# Patient Record
Sex: Female | Born: 1990
Health system: Southern US, Community
[De-identification: ages and names within clinical notes are randomized; demographics above are authoritative.]

## PROBLEM LIST (undated history)

## (undated) DIAGNOSIS — I10 Essential (primary) hypertension: Secondary | ICD-10-CM

## (undated) DIAGNOSIS — E669 Obesity, unspecified: Secondary | ICD-10-CM

## (undated) DIAGNOSIS — A6000 Herpesviral infection of urogenital system, unspecified: Secondary | ICD-10-CM

## (undated) DIAGNOSIS — O210 Mild hyperemesis gravidarum: Secondary | ICD-10-CM

## (undated) DIAGNOSIS — K851 Biliary acute pancreatitis without necrosis or infection: Secondary | ICD-10-CM

## (undated) DIAGNOSIS — L089 Local infection of the skin and subcutaneous tissue, unspecified: Secondary | ICD-10-CM

## (undated) DIAGNOSIS — B999 Unspecified infectious disease: Secondary | ICD-10-CM

## (undated) HISTORY — DX: Biliary acute pancreatitis without necrosis or infection: K85.10

## (undated) HISTORY — PX: BREAST REDUCTION SURGERY: SHX8

## (undated) HISTORY — DX: Mild hyperemesis gravidarum: O21.0

---
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
## 2009-02-19 ENCOUNTER — Emergency Department (HOSPITAL_COMMUNITY): Admission: EM | Admit: 2009-02-19 | Discharge: 2009-02-19 | Payer: Self-pay | Admitting: Family Medicine

## 2009-04-26 ENCOUNTER — Ambulatory Visit: Payer: Self-pay | Admitting: Physician Assistant

## 2009-04-26 DIAGNOSIS — A609 Anogenital herpesviral infection, unspecified: Secondary | ICD-10-CM | POA: Insufficient documentation

## 2009-04-26 DIAGNOSIS — A6000 Herpesviral infection of urogenital system, unspecified: Secondary | ICD-10-CM

## 2009-05-08 ENCOUNTER — Encounter: Payer: Self-pay | Admitting: Physician Assistant

## 2009-06-06 ENCOUNTER — Encounter: Payer: Self-pay | Admitting: Physician Assistant

## 2009-08-16 ENCOUNTER — Ambulatory Visit: Payer: Self-pay | Admitting: Physician Assistant

## 2009-08-20 ENCOUNTER — Ambulatory Visit: Payer: Self-pay | Admitting: Physician Assistant

## 2009-08-21 ENCOUNTER — Encounter: Payer: Self-pay | Admitting: Physician Assistant

## 2009-08-21 LAB — CONVERTED CEMR LAB
Cholesterol, target level: 200 mg/dL
HDL: 49 mg/dL (ref >39)
LDL Goal: 160 mg/dL
Triglycerides: 138 mg/dL (ref <150)
VLDL: 28 mg/dL (ref 0–40)

## 2009-11-22 ENCOUNTER — Telehealth (INDEPENDENT_AMBULATORY_CARE_PROVIDER_SITE_OTHER): Payer: Self-pay | Admitting: Nurse Practitioner

## 2009-11-26 ENCOUNTER — Encounter: Payer: Self-pay | Admitting: Physician Assistant

## 2009-11-26 ENCOUNTER — Ambulatory Visit: Payer: Self-pay | Admitting: Nurse Practitioner

## 2009-11-26 DIAGNOSIS — N3000 Acute cystitis without hematuria: Secondary | ICD-10-CM | POA: Insufficient documentation

## 2009-11-26 DIAGNOSIS — I10 Essential (primary) hypertension: Secondary | ICD-10-CM | POA: Insufficient documentation

## 2009-11-26 LAB — CONVERTED CEMR LAB
Blood Glucose, Fingerstick: 111
Protein, U semiquant: 30
Urobilinogen, UA: 0.2
pH: 7

## 2009-11-27 LAB — CONVERTED CEMR LAB
Alkaline Phosphatase: 71 units/L (ref 39–117)
BUN: 11 mg/dL (ref 6–23)
Calcium: 9.7 mg/dL (ref 8.4–10.5)
Creatinine, Ser: 0.78 mg/dL (ref 0.40–1.20)
Eosinophils Absolute: 0.4 10*3/uL (ref 0.0–0.7)
Glucose, Bld: 80 mg/dL (ref 70–99)
HCT: 41.6 % (ref 36.0–46.0)
Hemoglobin: 13.1 g/dL (ref 12.0–15.0)
Lymphs Abs: 4.9 10*3/uL — ABNORMAL HIGH (ref 0.7–4.0)
MCHC: 31.5 g/dL (ref 30.0–36.0)
MCV: 86.8 fL (ref 78.0–100.0)
Monocytes Absolute: 0.7 10*3/uL (ref 0.1–1.0)
Platelets: 344 10*3/uL (ref 150–400)
Potassium: 4.8 meq/L (ref 3.5–5.3)
RDW: 13 % (ref 11.5–15.5)
TSH: 0.797 microintl units/mL (ref 0.350–4.500)
Total Bilirubin: 0.3 mg/dL (ref 0.3–1.2)
Total Protein: 7.4 g/dL (ref 6.0–8.3)

## 2010-02-12 NOTE — Assessment & Plan Note (Signed)
Summary: NEW PT/ FIRST EST CARE//GK   Vital Signs:  Patient profile:   20 year old female Height:      68 inches Weight:      308 pounds BMI:     47.00 Temp:     97.9 degrees F oral Pulse rate:   111 / minute Pulse rhythm:   regular Resp:     18 per minute BP sitting:   129 / 82  (left arm) Cuff size:   large  Vitals Entered By: Mikey College CMA (April 26, 2009 2:23 PM) CC: NP..... Is Patient Diabetic? No Pain Assessment Patient in pain? no       Does patient need assistance? Functional Status Self care Ambulation Normal   Primary Care Provider:  Tereso Newcomer PA-C  CC:  NP......  History of Present Illness: New Patient.  No complaints.  [facility] I see her sister and her mother.   Health Maint: Well Woman checks done at [facility]. Last pap 04/2009:  always have been normal. Sexually active with one partner. Last Td: probably more than 10 years.   Habits & Providers  Alcohol-Tobacco-Diet     Alcohol drinks/day: 0     Tobacco Status: never  Exercise-Depression-Behavior     Drug Use: no  Allergies (verified): No Known Drug Allergies  Past History:  Past Medical History: Genital Herpes  Past Surgical History: s/p breast reduction surgery 2009  Family History: DM - Greatgrandmother No breast, colon or ovarian cancer.  Social History: Occupation: Film/video editor; studying Spanish Single no kids Never Smoked Alcohol use-no Drug use-no Occupation:  employed Smoking Status:  never Drug Use:  no  Review of Systems  The patient denies fever, chest pain, dyspnea on exertion, prolonged cough, melena, and hematochezia.    Physical Exam  General:  alert, well-developed, and well-nourished.   Head:  normocephalic and atraumatic.   Eyes:  pupils equal, pupils round, and pupils reactive to light.   Ears:  R ear normal and L ear normal.   Nose:  no external deformity.   Mouth:  pharynx pink and moist.   Neck:  supple and no  thyromegaly.   Lungs:  normal breath sounds, no crackles, and no wheezes.   Heart:  normal rate and regular rhythm.   Abdomen:  soft and non-tender.   Neurologic:  alert & oriented X3 and cranial nerves II-XII intact.   Psych:  normally interactive.     Impression & Recommendations:  Problem # 1:  Preventive Health Care (ICD-V70.0) get records from [facility]. schedule CPP in one year return fasting for labs (CMET, CBC, TSH, Lipids)  Problem # 2:  GENITAL HERPES (ICD-054.10) refill Valtrex as needed  Problem # 3:  MORBID OBESITY (ICD-278.01) long d/w Davida Helt about diet and exercise rec. the [employer] Diet  also will refer to [employer]  Complete Medication List: 1)  Valtrex 500 Mg Tabs (Valacyclovir hcl) .Marland Kitchen.. 1 by mouth every 12 hours for 3 days as needed for outbreak  Patient Instructions: 1)  Return in the next 2-4 weeks fasting for labs (do not eat or drink anything after midnight except water): 2)  CMET, CBC, TSH, Lipids, Drug Screen (V70.0, 278.01) 3)  Schedule appointment with Drucilla Schmidt for nutrition counseling. 4)  I suggest you look at the book, "The [facility] Diet," by Dr. Roylene Reason for help with diet. 5)  Exercise:  Increase walking until you get to 20-30 minutes 5-7 days a  week.  Walk fast enough that you would not be able to talk on the cell phone. 6)  Try counting calories for 2 weeks.  Then, review your caloric intake to see where you can cut back on certain things. 7)  Sign forms to get records released from [facility]. 8)  Please schedule a follow-up appointment in 1 year with Tashai Catino for CPP.

## 2010-02-12 NOTE — Assessment & Plan Note (Signed)
Summary: Acute - Cystitis   Vital Signs:  Patient profile:   20 year old female LMP:     10/27/2009 Weight:      302.2 pounds BMI:     46.12 Temp:     97.1 degrees F oral Pulse rate:   88 / minute Pulse rhythm:   regular Resp:     20 per minute BP sitting:   130 / 90  (left arm) Cuff size:   large  Vitals Entered By: Levon Hedger (November 26, 2009 2:58 PM)  Nutrition Counseling: Patient's BMI is greater than 25 and therefore counseled on weight management options. CC: elevated blood pressure....wants to be tested for diabetes, Hypertension Management Is Patient Diabetic? No Pain Assessment Patient in pain? no      CBG Result 111 CBG Device ID A  Does patient need assistance? Functional Status Self care Ambulation Normal LMP (date): 10/27/2009     Enter LMP: 10/27/2009   Primary Care Provider:  Tereso Newcomer PA-C  CC:  elevated blood pressure....wants to be tested for diabetes and Hypertension Management.  History of Present Illness:  Pt into the office with c/o elevated blood pressure. Pt reports that she [facility] to the [facility] for [facility]TI and was informed that her BP was elevated. No previous reports of htn.  Obesity - Down 5 pounds since the last visit.   pt does not do any meaningful exercise  Cystitis - Still with pelvic pressure She took all the medications as prescribed by the health department  Hypertension History:      She denies headache, chest pain, and palpitations.  She notes no problems with any antihypertensive medication side effects.        Positive major cardiovascular risk factors include hypertension.  Negative major cardiovascular risk factors include female age less than 21 years old, no history of diabetes, negative family history for ischemic heart disease, and non-tobacco-user status.        Further assessment for target organ damage reveals no history of ASHD, stroke/TIA, or peripheral vascular disease.     Habits &  Providers  Alcohol-Tobacco-Diet     Alcohol drinks/day: 0     Tobacco Status: never  Exercise-Depression-Behavior     Does Patient Exercise: no     Drug Use: no  Allergies (verified): No Known Drug Allergies  Family History: DM - Greatgrandmother No breast, colon or ovarian cancer. maternal grandmother - htn  Social History: Does Patient Exercise:  no  Review of Systems General:  Denies fever. CV:  Denies chest pain or discomfort. Resp:  Denies cough. GI:  Denies abdominal pain, nausea, and vomiting. Endo:  Denies excessive thirst and excessive urination.  Physical Exam  General:  alert.   Head:  normocephalic.   Lungs:  normal breath sounds.   Heart:  normal rate and regular rhythm.   Abdomen:  normal bowel sounds.   Msk:  normal ROM.   Neurologic:  alert & oriented X3.   Skin:  color normal.   Psych:  Oriented X3.     Impression & Recommendations:  Problem # 1:  HYPERTENSION, BENIGN ESSENTIAL (ICD-401.1)  BP elevated today. advised weight loss and diet control. Orders: T-Comprehensive Metabolic Panel [id number]) T-CBC w/Diff ([id number]) Rapid HIV  (29562)  Problem # 2:  NEED PROPHYLACTIC VACCINATION&INOCULATION FLU (ICD-V04.81) given flu today in office  Problem # 3:  MORBID OBESITY (ICD-278.01)  pt advised to start exercise and diet  Orders: T-TSH ([id number])  Problem # 4:  ACUTE CYSTITIS (  ICD-595.0)  Orders: UA Dipstick w/o Micro (manual) (14782) T-Culture, Urine ([id number])  Complete Medication List: 1)  Valtrex 500 Mg Tabs (Valacyclovir hcl) .Marland Kitchen.. 1 by mouth every 12 hours for 3 days as needed for outbreak  Other Orders: Capillary Blood Glucose/CBG (78469) Flu Vaccine 55yrs + (62952) Admin 1st Vaccine (84132)  Hypertension Assessment/Plan:      The patient's hypertensive risk group is category A: No risk factors and no target organ damage.  Her calculated 10 year risk of coronary heart disease is 1 %.  Today's blood pressure is  130/90.  Her blood pressure goal is < 140/90.  Patient Instructions: 1)  Blood pressure - elevated today. 2)  You will need to monitor your diet and exercise. 3)  Start walking at least 10-15 minutes daily. 4)  You have received the flu vaccine today. 5)  Schedule an appointment for a complete physical exam 6)  Come fasting after midnight before this appointment.  7)  Will need PAP, lipids, u/a   Orders Added: 1)  Capillary Blood Glucose/CBG [82948] 2)  Est. Patient Level III [44010] 3)  UA Dipstick w/o Micro (manual) [81002] 4)  T-Comprehensive Metabolic Panel [80053-22900] 5)  T-CBC w/Diff [27253-66440] 6)  Rapid HIV  [92370] 7)  T-TSH [34742-59563] 8)  T-Culture, Urine [87564-33295] 9)  Flu Vaccine 34yrs + [18841] 10)  Admin 1st Vaccine [66063]   Immunizations Administered:  Influenza Vaccine # 1:    Vaccine Type: Fluvax 3+    Site: right deltoid    Mfr: GlaxoSmithKline    Dose: 0.5 ml    Route: IM    Given by: Levon Hedger    Exp. Date: 07/13/2010    Lot #: [id number]    VIS given: 08/07/09 version given November 26, 2009.  Flu Vaccine Consent Questions:    Do you have a history of severe allergic reactions to this vaccine? no    Any prior history of allergic reactions to egg and/or gelatin? no    Do you have a sensitivity to the preservative Thimersol? no    Do you have a past history of Guillan-Barre Syndrome? no    Do you currently have an acute febrile illness? no    Have you ever had a severe reaction to latex? no    Vaccine information given and explained to patient? yes    Are you currently pregnant? no    ndc  [phone]  Immunizations Administered:  Influenza Vaccine # 1:    Vaccine Type: Fluvax 3+    Site: right deltoid    Mfr: GlaxoSmithKline    Dose: 0.5 ml    Route: IM    Given by: Levon Hedger    Exp. Date: 07/13/2010    Lot #: [id number]    VIS given: 08/07/09 version given November 26, 2009.  Laboratory Results   Urine  Tests  Date/Time Received: November 26, 2009 3:16 PM   Routine Urinalysis   Color: dk yellow Appearance: Cloudy Glucose: negative   (Normal Range: Negative) Bilirubin: negative   (Normal Range: Negative) Ketone: negative   (Normal Range: Negative) Spec. Gravity: 1.015   (Normal Range: 1.003-1.035) Blood: small   (Normal Range: Negative) pH: 7.0   (Normal Range: 5.0-8.0) Protein: 30   (Normal Range: Negative) Urobilinogen: 0.2   (Normal Range: 0-1) Nitrite: positive   (Normal Range: Negative) Leukocyte Esterace: large   (Normal Range: Negative)     Blood Tests     CBG Random:: 111    Other Tests  Rapid HIV: negative    Prevention & Chronic Care Immunizations   Influenza vaccine: Fluvax 3+  (11/26/2009)    Tetanus booster: 08/16/2001: historical    Pneumococcal vaccine: Not documented  Other Screening   Pap smear: Not documented   Smoking status: never  (11/26/2009)  Hypertension   Last Blood Pressure: 130 / 90  (11/26/2009)   Serum creatinine: Not documented   Serum potassium Not documented CMP ordered   Self-Management Support :    Hypertension self-management support: Not documented    Laboratory Results   Urine Tests    Routine Urinalysis   Color: dk yellow Appearance: Cloudy Glucose: negative   (Normal Range: Negative) Bilirubin: negative   (Normal Range: Negative) Ketone: negative   (Normal Range: Negative) Spec. Gravity: 1.015   (Normal Range: 1.003-1.035) Blood: small   (Normal Range: Negative) pH: 7.0   (Normal Range: 5.0-8.0) Protein: 30   (Normal Range: Negative) Urobilinogen: 0.2   (Normal Range: 0-1) Nitrite: positive   (Normal Range: Negative) Leukocyte Esterace: large   (Normal Range: Negative)     Blood Tests     CBG Random:: 111mg /dL    Other Tests  Rapid HIV: negative

## 2010-02-12 NOTE — Letter (Signed)
Summary: PT INFORMATION SHEET  PT INFORMATION SHEET   Imported By: Arta Bruce 06/26/2009 14:54:52  _____________________________________________________________________  External Attachment:    Type:   Image     Comment:   External Document

## 2010-02-12 NOTE — Letter (Signed)
Summary: Lipid Letter  [facility]  [address] [place], [place] [address]   Phone: [phone]  Fax: [phone]    08/21/2009  Stacy Fowler [address] Shaune [place], [place]  [address]  Dear Stacy Fowler:  Here are the results of your cholesterol test:    Cholesterol:       183     Goal: <200   HDL "good" Cholesterol:   49     Goal: >40   LDL "bad" Cholesterol:   106     Goal: <160   Triglycerides:       138     Goal: <150   As you can see, your cholesterol goals have been met.  You do not need to check your cholesterol again for 5 years.  If you have any questions, please call.   Sincerely,    Tereso Newcomer PA-C

## 2010-02-12 NOTE — Miscellaneous (Signed)
  Clinical Lists Changes  Observations: Added new observation of TD BOOSTER: historical (08/16/2001 10:28)

## 2010-02-12 NOTE — Assessment & Plan Note (Signed)
Summary: Patient not seen; requesting cholesterol check only   Allergies: No Known Drug Allergies   Complete Medication List: 1)  Valtrex 500 Mg Tabs (Valacyclovir hcl) .Marland Kitchen.. 1 by mouth every 12 hours for 3 days as needed for outbreak

## 2010-02-12 NOTE — Progress Notes (Signed)
Summary: POSSIBLE BP  Phone Note Call from Patient Call back at [phone]   Reason for Call: Talk to Nurse Summary of Call: WEAVER PT MS Irby WENT TO [facility] FOR A UTI, AND ALSO TOLD HER SHE HAS HIGH BLOOD PRESSURE. SO SHE IS TRYING TO GET AN APPT. AND ALSO HER MOTHER WANTS HER TO BE CHECKED FOR DIABETES, BECAUSE IT RUNS ON HER GRANDMOTHERS SIDE OF THE FAMILY. Initial call taken by: Leodis Rains,  November 22, 2009 3:34 PM  Follow-up for Phone Call        Gave her Bactrim for the UTI, didn't do anything for BP -- "151/something."  Denies other symptoms with BP.  Had discomfort with voiding, no other symptoms specified.  Denies polydipsia, polyphagia or polyuria.  Also wants to be checked for diabetes at appt.  Appt. made 11/26/09. Follow-up by: Dutch Quint RN,  November 23, 2009 5:04 PM

## 2010-02-12 NOTE — Letter (Signed)
Summary: REQUESTING RECORDS FROM [facility]  REQUESTING RECORDS FROM [facility]   Imported By: Arta Bruce 06/04/2009 11:51:33  _____________________________________________________________________  External Attachment:    Type:   Image     Comment:   External Document

## 2010-02-12 NOTE — Letter (Signed)
Summary: REQUESTING RECORDS FROM [facility]  REQUESTING RECORDS FROM [facility]   Imported By: Arta Bruce 06/04/2009 11:46:31  _____________________________________________________________________  External Attachment:    Type:   Image     Comment:   External Document

## 2010-04-03 LAB — URINE CULTURE

## 2010-04-03 LAB — POCT URINALYSIS DIP (DEVICE)
Nitrite: NEGATIVE
Urobilinogen, UA: 0.2 mg/dL (ref 0.0–1.0)
pH: 8.5 — ABNORMAL HIGH (ref 5.0–8.0)

## 2010-04-03 LAB — POCT PREGNANCY, URINE: Preg Test, Ur: NEGATIVE

## 2010-05-28 NOTE — Op Note (Signed)
Stacy Fowler, FRANZE                 ACCOUNT NO.:  [id number]   MEDICAL RECORD NO.:  [id number]          PATIENT TYPE:  AMB   LOCATION:  DSC                          FACILITY:  [facility]   PHYSICIAN:  Earvin Hansen L. Truesdale, M.D.DATE OF BIRTH:  Apr 07, 1990   DATE OF PROCEDURE:  07/12/2007  DATE OF DISCHARGE:                               OPERATIVE REPORT   This is an 20 year old with severe severe severe macromastia and  gynecomastia, increased sensory breast tissue causing back pain,  shoulder pain, intertriginous changes throughout the portions of her  breasts causing intolerable and the patient is now being prepared for  bilateral breast reduction for medical reasons with excision of  accessory breast tissue.   ANESTHESIA:  General.   Preoperatively, the patient was setup and drawn for the new inferior  pedicle reduction mammoplasty, the remarking nipple-areolar complex  approximately 26 cm from the suprasternal notch.  She underwent general  anesthesia, intubated orally.  Prep was done to the chest, breast, areas  in a routine fashion using Betadine soap and solution and walled off  with sterile towels and drapes so as to make a sterile field.  One-  quarter percent Xylocaine with epinephrine injected locally total of 150  mL per side for vasoconstriction.  The wounds were scored with #15  blades.  The skin of the inferior pedicle was de-epithelialized with #20  blade.  Medial and lateral fatty dermal pedicles were incised down to  underlying fascia.  After proper hemostasis, the new keyhole area was  also debulked and laterally accessory breast tissue debulked with large  amounts.  Hemostasis was maintained with the Bovie unit on coagulation  and after this, the flaps were transposed and stayed with 3-0 Prolene  suture.  Subcutaneous closure was done with 3-0 Monocryl x2 layers and  running subcuticular stitch of 3-0 Monocryl and 5-0 Monocryl throughout  the inverted T.  The wounds were  drained with #10 fully fluted Blake  drain which was placed in the depths of wound and brought out through  the lateral-most portion of the incision and secured with 3-0 Prolene.  The wounds were cleansed, Steri-Strips and soft dressing were applied  including Xeroform, 4x4s, ABDs, Hypafix tape.  At the end of the  procedure, the nipple-areolar complex were examined showing excellent  suppleness and blood supply.  She was then taken to recovery in  excellent condition.   ESTIMATED BLOOD LOSS:  Less than 150 mL.   COMPLICATIONS:  None.      Yaakov Guthrie. Shon Hough, M.D.  Electronically Signed     GLT/MEDQ  D:  07/12/2007  T:  07/13/2007  Job:  [id number]

## 2010-10-21 ENCOUNTER — Inpatient Hospital Stay (INDEPENDENT_AMBULATORY_CARE_PROVIDER_SITE_OTHER)
Admission: RE | Admit: 2010-10-21 | Discharge: 2010-10-21 | Disposition: A | Discharge status: Home / Self Care | Payer: Self-pay | Source: Ambulatory Visit | Attending: Family Medicine | Admitting: Family Medicine

## 2010-10-21 DIAGNOSIS — A6000 Herpesviral infection of urogenital system, unspecified: Secondary | ICD-10-CM

## 2010-10-21 DIAGNOSIS — L089 Local infection of the skin and subcutaneous tissue, unspecified: Secondary | ICD-10-CM

## 2010-10-26 ENCOUNTER — Inpatient Hospital Stay (INDEPENDENT_AMBULATORY_CARE_PROVIDER_SITE_OTHER)
Admission: RE | Admit: 2010-10-26 | Discharge: 2010-10-26 | Disposition: A | Discharge status: Home / Self Care | Payer: Self-pay | Source: Ambulatory Visit | Attending: Family Medicine | Admitting: Family Medicine

## 2010-10-26 DIAGNOSIS — L0231 Cutaneous abscess of buttock: Secondary | ICD-10-CM

## 2011-08-22 ENCOUNTER — Emergency Department (HOSPITAL_COMMUNITY)
Admission: EM | Admit: 2011-08-22 | Discharge: 2011-08-23 | Disposition: A | Discharge status: Home / Self Care | Payer: Self-pay | Attending: Emergency Medicine | Admitting: Emergency Medicine

## 2011-08-22 ENCOUNTER — Encounter (HOSPITAL_COMMUNITY): Payer: Self-pay | Admitting: Emergency Medicine

## 2011-08-22 DIAGNOSIS — L03317 Cellulitis of buttock: Secondary | ICD-10-CM | POA: Insufficient documentation

## 2011-08-22 DIAGNOSIS — L0231 Cutaneous abscess of buttock: Secondary | ICD-10-CM | POA: Insufficient documentation

## 2011-08-22 NOTE — ED Notes (Signed)
Pt states she has a boil at the end of her tailbone area that she noticed yesterday  Pt states she has had one before in the same spot and had to have an I&D at [facility]

## 2011-08-23 MED ORDER — SULFAMETHOXAZOLE-TRIMETHOPRIM 800-160 MG PO TABS: 2.0000 | ORAL_TABLET | Freq: Two times a day (BID) | ORAL | Status: AC

## 2011-08-23 MED ORDER — HYDROCODONE-ACETAMINOPHEN 5-325 MG PO TABS: 1.0000 | ORAL_TABLET | ORAL | Status: AC | PRN

## 2011-08-23 NOTE — ED Provider Notes (Signed)
History     CSN: [id number]  Arrival date & time 08/22/11  2011   First MD Initiated Contact with Patient 08/22/11 2349      Chief Complaint  Patient presents with  . Abscess    (Consider location/radiation/quality/duration/timing/severity/associated sxs/prior treatment) HPI Comments: Patient with hx abscess reports pain and swelling over her bilateral buttocks that she noticed today.  Denies fever, chills, discharge from the wound.  Has had I&D of the same location before.    Patient is a 21 y.o. female presenting with abscess. The history is provided by the patient.  Abscess  Pertinent negatives include no fever.    History reviewed. No pertinent past medical history.  Past Surgical History  Procedure Date  . Breast reduction surgery     Family History  Problem Relation Age of Onset  . Diabetes Other   . Hypertension Other     History  Substance Use Topics  . Smoking status: Never Smoker   . Smokeless tobacco: Not on file  . Alcohol Use: No    OB History    Grav Para Term Preterm Abortions TAB SAB Ect Mult Living                  Review of Systems  Constitutional: Negative for fever and chills.  Skin: Negative for rash and wound.    Allergies  Other  Home Medications   Current Outpatient Rx  Name Route Sig Dispense Refill  . ETONOGESTREL 68 MG Antler IMPL Subcutaneous Inject 1 each into the skin continuous.    Marland Kitchen VALACYCLOVIR HCL 500 MG PO TABS Oral Take 500 mg by mouth as needed.      BP 121/65  Pulse 94  Temp 99.5 F (37.5 C)  Resp 18  SpO2 98%  Physical Exam  Nursing note and vitals reviewed. Constitutional: She appears well-developed and well-nourished. No distress.  HENT:  Head: Normocephalic and atraumatic.  Neck: Neck supple.  Pulmonary/Chest: Effort normal.  Neurological: She is alert.  Skin: She is not diaphoretic.       ED Course  Procedures (including critical care time)  Labs Reviewed - No data to display No results  found.  INCISION AND DRAINAGE Performed by: Trixie Dredge B  Performed by Alroy Dust, PA-S, under my supervision.   Consent: Verbal consent obtained. Risks and benefits: risks, benefits and alternatives were discussed Type: abscess  Body area: right buttock  Anesthesia: local infiltration  Local anesthetic: lidocaine 2% no epinephrine  Anesthetic total: 5 ml  Complexity: complex Blunt dissection to break up loculations  Drainage: bloody Drainage amount: small  Packing material: none  Patient tolerance: Patient tolerated the procedure well with no immediate complications.     1. Abscess of buttock       MDM  Pt with two small areas of induration on her bilateral buttocks, tender to palpation.  No overlying cellulitis.  I&D performed without purulent drainage.  Likely early abscess.  Pt has had this in the same spot before.  Pt d/c home on bactrim, norco.  Pt aware she may need further I&D as abscess develops.  Discussed diagnosis, follow up with patient.  Pt given return precautions.  Pt verbalizes understanding and agrees with plan.           [place], [place] 08/23/11 0100

## 2011-08-24 NOTE — ED Provider Notes (Signed)
Medical screening examination/treatment/procedure(s) were performed by non-physician practitioner and as supervising physician I was immediately available for consultation/collaboration.  Flay Ghosh, MD 08/24/11 0641 

## 2011-08-27 ENCOUNTER — Emergency Department (INDEPENDENT_AMBULATORY_CARE_PROVIDER_SITE_OTHER)
Admission: EM | Admit: 2011-08-27 | Discharge: 2011-08-27 | Disposition: A | Discharge status: Home / Self Care | Payer: Self-pay | Source: Home / Self Care | Attending: Emergency Medicine | Admitting: Emergency Medicine

## 2011-08-27 ENCOUNTER — Encounter (HOSPITAL_COMMUNITY): Payer: Self-pay | Admitting: *Deleted

## 2011-08-27 DIAGNOSIS — L0501 Pilonidal cyst with abscess: Secondary | ICD-10-CM

## 2011-08-27 HISTORY — DX: Obesity, unspecified: E66.9

## 2011-08-27 HISTORY — DX: Herpesviral infection of urogenital system, unspecified: A60.00

## 2011-08-27 HISTORY — DX: Local infection of the skin and subcutaneous tissue, unspecified: L08.9

## 2011-08-27 MED ORDER — IBUPROFEN 800 MG PO TABS: 800.0000 mg | ORAL_TABLET | Freq: Three times a day (TID) | ORAL | Status: AC | PRN

## 2011-08-27 MED ORDER — METRONIDAZOLE 500 MG PO TABS: 500.0000 mg | ORAL_TABLET | Freq: Three times a day (TID) | ORAL | Status: AC

## 2011-08-27 MED ORDER — BACITRACIN 500 UNIT/GM EX OINT
1.0000 "application " | TOPICAL_OINTMENT | Freq: Once | CUTANEOUS | Status: AC
  Administered 2011-08-27: 1 via TOPICAL

## 2011-08-27 MED ORDER — LIDOCAINE-EPINEPHRINE 2 %-1:100000 IJ SOLN
5.0000 mL | Freq: Once | INTRAMUSCULAR | Status: AC
  Administered 2011-08-27: 5 mL

## 2011-08-27 NOTE — ED Provider Notes (Signed)
History     CSN: [id number]  Arrival date & time 08/27/11  1342   First MD Initiated Contact with Patient 08/27/11 1504      Chief Complaint  Patient presents with  . Recurrent Skin Infections    (Consider location/radiation/quality/duration/timing/severity/associated sxs/prior treatment) HPI Comments: Pt with painful erythematous mass of gradually increasing size on R buttock x 1 week. Was seen in the [facility] 5 days ago, I&D was attempted with no purulent drainage. She was started on Bactrim, Vicodin. Patient states that pain is getting worse. No drainage. No N/V, fevers. States she gets frequent abscesses, but has only had one in this area once before. She is not a diabetic.  ROS as noted in HPI. All other ROS negative.    Past Medical History  Diagnosis Date  . Skin infection   . Genital herpes   . Obesity     Past Surgical History  Procedure Date  . Breast reduction surgery     Family History  Problem Relation Age of Onset  . Diabetes Other   . Hypertension Other     History  Substance Use Topics  . Smoking status: Never Smoker   . Smokeless tobacco: Not on file  . Alcohol Use: No    OB History    Grav Para Term Preterm Abortions TAB SAB Ect Mult Living                  Review of Systems  Allergies  Other  Home Medications   Current Outpatient Rx  Name Route Sig Dispense Refill  . ETONOGESTREL 68 MG [place] IMPL Subcutaneous Inject 1 each into the skin continuous.    Marland Kitchen HYDROCODONE-ACETAMINOPHEN 5-325 MG PO TABS Oral Take 1-2 tablets by mouth every 4 (four) hours as needed for pain. 15 tablet 0  . IBUPROFEN 800 MG PO TABS Oral Take 1 tablet (800 mg total) by mouth every 8 (eight) hours as needed for pain or fever. 20 tablet 0  . METRONIDAZOLE 500 MG PO TABS Oral Take 1 tablet (500 mg total) by mouth 3 (three) times daily. X 7 days 21 tablet 0  . SULFAMETHOXAZOLE-TRIMETHOPRIM 800-160 MG PO TABS Oral Take 2 tablets by mouth 2 (two) times daily. 28  tablet 0  . VALACYCLOVIR HCL 500 MG PO TABS Oral Take 500 mg by mouth as needed.      BP 134/75  Pulse 104  Temp 98.6 F (37 C) (Oral)  Resp 16  SpO2 100%  Physical Exam  Nursing note and vitals reviewed. Constitutional: She is oriented to person, place, and time. She appears well-developed and well-nourished. No distress.  HENT:  Head: Normocephalic and atraumatic.  Eyes: Conjunctivae and EOM are normal.  Neck: Normal range of motion.  Cardiovascular: Normal rate.   Pulmonary/Chest: Effort normal.  Abdominal: She exhibits no distension.  Genitourinary:       7.5 x 4.5 cm area of tender erythema right medial buttocks just lateral to the gluteal cleft. Large amount of central fluctuance. Healing I&D wound.  Musculoskeletal: Normal range of motion.  Neurological: She is alert and oriented to person, place, and time. Coordination normal.  Skin: Skin is warm and dry.  Psychiatric: She has a normal mood and affect. Her behavior is normal. Judgment and thought content normal.    ED Course  INCISION AND DRAINAGE Date/Time: 08/27/2011 4:59 PM Performed by: Luiz Blare Authorized by: Luiz Blare Consent: Verbal consent obtained. Risks and benefits: risks, benefits and alternatives  were discussed Consent given by: patient Patient understanding: patient states understanding of the procedure being performed Patient consent: the patient's understanding of the procedure matches consent given Procedure consent: procedure consent matches procedure scheduled Required items: required blood products, implants, devices, and special equipment available Patient identity confirmed: verbally with patient Type: pilonidal cyst Body area: anogenital Location details: gluteal cleft Local anesthetic: lidocaine 2% with epinephrine Anesthetic total: 5 ml Patient sedated: no Scalpel size: 11 Incision type: single straight Complexity: simple Drainage: purulent Drainage amount:  copious Wound treatment: drain placed Packing material: 1/4 in iodoform gauze Patient tolerance: Patient tolerated the procedure well with no immediate complications. Comments: Drain extensive amount of purulent foul-smelling material mixed with blood. Irrigated wound with 5 mL of lidocaine with epi 2%, place packing, pressure dressing.   (including critical care time)  Labs Reviewed - No data to display No results found.   1. Pilonidal cyst with abscess     MDM  Starting on Flagyl, will have her continue the Bactrim. Starting ibuprofen and will have her continue the Norco. Having her return in 2 days for recheck and packing removal. Discussed signs and symptoms that should prompt an earlier return. Patient agrees with plan.    Luiz Blare, MD 08/27/11 1700

## 2011-08-27 NOTE — ED Notes (Signed)
Pt  Seen  [facility]  [facility]  5  Days  Ago  For  Boil  -   Placed  On  Anti biotics  And  Told  To  Return  If  Worse       Pt  denys  Any  Diabetes  Or  Any  Other  Chronic  Medical problems         Pt is  Alert  And  Oriented   Appears  In no  Acute  Distress

## 2012-09-25 ENCOUNTER — Emergency Department (HOSPITAL_COMMUNITY)
Admission: EM | Admit: 2012-09-25 | Discharge: 2012-09-25 | Disposition: A | Discharge status: Home / Self Care | Payer: Commercial Managed Care - PPO | Attending: Emergency Medicine | Admitting: Emergency Medicine

## 2012-09-25 ENCOUNTER — Encounter (HOSPITAL_COMMUNITY): Payer: Self-pay | Admitting: *Deleted

## 2012-09-25 DIAGNOSIS — J020 Streptococcal pharyngitis: Secondary | ICD-10-CM | POA: Insufficient documentation

## 2012-09-25 DIAGNOSIS — R51 Headache: Secondary | ICD-10-CM | POA: Insufficient documentation

## 2012-09-25 DIAGNOSIS — Z8619 Personal history of other infectious and parasitic diseases: Secondary | ICD-10-CM | POA: Insufficient documentation

## 2012-09-25 DIAGNOSIS — R059 Cough, unspecified: Secondary | ICD-10-CM | POA: Insufficient documentation

## 2012-09-25 DIAGNOSIS — Z872 Personal history of diseases of the skin and subcutaneous tissue: Secondary | ICD-10-CM | POA: Insufficient documentation

## 2012-09-25 DIAGNOSIS — E669 Obesity, unspecified: Secondary | ICD-10-CM | POA: Insufficient documentation

## 2012-09-25 DIAGNOSIS — R509 Fever, unspecified: Secondary | ICD-10-CM | POA: Insufficient documentation

## 2012-09-25 DIAGNOSIS — R05 Cough: Secondary | ICD-10-CM | POA: Insufficient documentation

## 2012-09-25 MED ORDER — ACETAMINOPHEN 160 MG/5ML PO SOLN: 15.0000 mg/kg | Freq: Once | ORAL | Status: DC

## 2012-09-25 MED ORDER — DEXAMETHASONE SODIUM PHOSPHATE 10 MG/ML IJ SOLN
10.0000 mg | Freq: Once | INTRAMUSCULAR | Status: AC
  Administered 2012-09-25: 10 mg via INTRAVENOUS
  Filled 2012-09-25: qty 1

## 2012-09-25 MED ORDER — SODIUM CHLORIDE 0.9 % IV BOLUS (SEPSIS)
1000.0000 mL | Freq: Once | INTRAVENOUS | Status: AC
  Administered 2012-09-25: 1000 mL via INTRAVENOUS

## 2012-09-25 MED ORDER — PIPERACILLIN-TAZOBACTAM 3.375 G IVPB 30 MIN: 3.3750 g | Freq: Once | INTRAVENOUS | Status: DC

## 2012-09-25 MED ORDER — CLINDAMYCIN PHOSPHATE 600 MG/50ML IV SOLN
600.0000 mg | Freq: Once | INTRAVENOUS | Status: AC
  Administered 2012-09-25: 600 mg via INTRAVENOUS
  Filled 2012-09-25: qty 50

## 2012-09-25 MED ORDER — ACETAMINOPHEN 500 MG PO TABS
1000.0000 mg | ORAL_TABLET | Freq: Once | ORAL | Status: AC
  Administered 2012-09-25: 1000 mg via ORAL
  Filled 2012-09-25: qty 2

## 2012-09-25 MED ORDER — PENICILLIN V POTASSIUM 500 MG PO TABS: 500.0000 mg | ORAL_TABLET | Freq: Four times a day (QID) | ORAL | Status: DC

## 2012-09-25 NOTE — ED Provider Notes (Signed)
CSN: [id number]     Arrival date & time 09/25/12  2014 History   First MD Initiated Contact with Patient 09/25/12 2035     Chief Complaint  Patient presents with  . Sore Throat  . Headache  . Fever   (Consider location/radiation/quality/duration/timing/severity/associated sxs/prior Treatment) HPI Comments: Patient is a 22 year old female who presents today with sore throat since yesterday. She took NyQuil and used cough drops with no relief. Her throat hurts worse when she swallows. She has been able to drink fluids, but has not eaten anything today. She has a mild associated cough. No ear pain. No congestion. Reports that she feels very hot in her body hurts. She has not measured her temperature.   The history is provided by the patient. No language interpreter was used.    Past Medical History  Diagnosis Date  . Skin infection   . Genital herpes   . Obesity    Past Surgical History  Procedure Laterality Date  . Breast reduction surgery     Family History  Problem Relation Age of Onset  . Diabetes Other   . Hypertension Other    History  Substance Use Topics  . Smoking status: Never Smoker   . Smokeless tobacco: Not on file  . Alcohol Use: No   OB History   Grav Para Term Preterm Abortions TAB SAB Ect Mult Living                 Review of Systems  Constitutional: Positive for fever.  HENT: Positive for sore throat. Negative for ear pain.   Gastrointestinal: Negative for nausea, vomiting and abdominal pain.  All other systems reviewed and are negative.    Allergies  Other  Home Medications   Current Outpatient Rx  Name  Route  Sig  Dispense  Refill  . etonogestrel (IMPLANON) 68 MG IMPL implant   Subcutaneous   Inject 1 each into the skin continuous.          BP 126/75  Pulse 114  Temp(Src) 100.7 F (38.2 C) (Oral)  Resp 20  Ht 5\' 7"  (1.702 m)  SpO2 98% Physical Exam  Nursing note and vitals reviewed. Constitutional: She is oriented to person,  place, and time. She appears well-developed and well-nourished. No distress.  HENT:  Head: Normocephalic and atraumatic.  Right Ear: Tympanic membrane, external ear and ear canal normal.  Left Ear: Tympanic membrane, external ear and ear canal normal.  Nose: Nose normal.  Mouth/Throat: Uvula is midline and mucous membranes are normal. No trismus in the jaw. No edematous. Posterior oropharyngeal erythema present. No tonsillar abscesses.  Significant bilateral tonsillar swelling.   Eyes: Conjunctivae are normal.  Neck: Normal range of motion.  Cardiovascular: Normal rate, regular rhythm and normal heart sounds.   Pulmonary/Chest: Effort normal and breath sounds normal. No stridor. No respiratory distress. She has no wheezes. She has no rales.  Abdominal: Soft. She exhibits no distension.  Musculoskeletal: Normal range of motion.  Neurological: She is alert and oriented to person, place, and time. She has normal strength.  Skin: Skin is warm and dry. She is not diaphoretic. No erythema.  Psychiatric: She has a normal mood and affect. Her behavior is normal.    ED Course  Procedures (including critical care time) Labs Review Labs Reviewed  RAPID STREP SCREEN - Abnormal; Notable for the following:    Streptococcus, Group A Screen (Direct) POSITIVE (*)    All other components within normal limits   Imaging Review  No results found.  MDM   1. Strep pharyngitis    Pt febrile with tonsillar exudate, cervical lymphadenopathy, & dysphagia; diagnosis of strep. Treated in the Ed with steroids, IV clinda, tylenol, and a fluid bolus.  Pt appears mildly dehydrated, discussed importance of water rehydration. Presentation non concerning for PTA or infxn spread to soft tissue. No trismus or uvula deviation. No trismus. Pt able to tolerate fluids and swallow pills in ED. Strict specific return precautions discussed. Pt able to drink water in ED without difficulty with intact air way. Recommended PCP  follow up.   Medications  dexamethasone (DECADRON) injection 10 mg (10 mg Intravenous Given 09/25/12 2136)  sodium chloride 0.9 % bolus 1,000 mL (0 mLs Intravenous Stopped 09/25/12 2255)  acetaminophen (TYLENOL) tablet 1,000 mg (1,000 mg Oral Given 09/25/12 2124)  clindamycin (CLEOCIN) IVPB 600 mg (0 mg Intravenous Stopped 09/25/12 2234)        Mora Bellman, PA-C 09/26/12 [phone]

## 2012-09-25 NOTE — ED Notes (Signed)
Pt states she noticed last night she had a sore throat and she took nyquill and halls cough drops,  No better today,  Feels hot and bodyaches

## 2012-09-27 NOTE — ED Provider Notes (Signed)
Medical screening examination/treatment/procedure(s) were conducted as a shared visit with non-physician practitioner(s) and myself.  I personally evaluated the patient during the encounter Pt c/o sore throat and fever. Strep pos. No asymmetric swelling or abscess noted. Breathing comfortably, no stridor. Able to swallow. abx rx.   Suzi Roots, MD 09/27/12 [phone]

## 2013-01-10 ENCOUNTER — Encounter (HOSPITAL_COMMUNITY): Payer: Self-pay | Admitting: Emergency Medicine

## 2013-01-10 ENCOUNTER — Emergency Department (HOSPITAL_COMMUNITY)
Admission: EM | Admit: 2013-01-10 | Discharge: 2013-01-10 | Disposition: A | Discharge status: Home / Self Care | Payer: Commercial Managed Care - PPO | Attending: Emergency Medicine | Admitting: Emergency Medicine

## 2013-01-10 DIAGNOSIS — L02419 Cutaneous abscess of limb, unspecified: Secondary | ICD-10-CM

## 2013-01-10 DIAGNOSIS — E669 Obesity, unspecified: Secondary | ICD-10-CM | POA: Insufficient documentation

## 2013-01-10 DIAGNOSIS — Z8619 Personal history of other infectious and parasitic diseases: Secondary | ICD-10-CM | POA: Insufficient documentation

## 2013-01-10 DIAGNOSIS — Z79899 Other long term (current) drug therapy: Secondary | ICD-10-CM | POA: Insufficient documentation

## 2013-01-10 DIAGNOSIS — IMO0002 Reserved for concepts with insufficient information to code with codable children: Secondary | ICD-10-CM | POA: Insufficient documentation

## 2013-01-10 MED ORDER — SULFAMETHOXAZOLE-TRIMETHOPRIM 800-160 MG PO TABS: 1.0000 | ORAL_TABLET | Freq: Two times a day (BID) | ORAL | Status: AC

## 2013-01-10 NOTE — ED Notes (Signed)
Dressing applied to patient's arm

## 2013-01-10 NOTE — ED Provider Notes (Signed)
CSN: [id number]     Arrival date & time 01/10/13  1912 History  This chart was scribed for non-physician practitioner, Antony Madura, PA-C working with Nelia Shi, MD by Greggory Stallion, ED scribe. This patient was seen in room [facility] and the patient's care was started at 9:59 PM.   Chief Complaint  Patient presents with  . Abscess   The history is provided by the patient. No language interpreter was used.   HPI Comments: Stacy Fowler is a 22 y.o. female who presents to the Emergency Department complaining of a worsening abscess under her left arm that she noticed 3 days ago. Pt states there is also associated pain which is constant and nonradiating. She has done warm compresses twice per day with no relief. Denies fever, red linear streaking, breast tenderness, nipple discharge, arm numbness, and weakness. Pt has history of abscess. Denies history of MRSA.   Past Medical History  Diagnosis Date  . Skin infection   . Genital herpes   . Obesity    Past Surgical History  Procedure Laterality Date  . Breast reduction surgery     Family History  Problem Relation Age of Onset  . Diabetes Other   . Hypertension Other    History  Substance Use Topics  . Smoking status: Never Smoker   . Smokeless tobacco: Not on file  . Alcohol Use: No   OB History   Grav Para Term Preterm Abortions TAB SAB Ect Mult Living                 Review of Systems  Constitutional: Negative for fever.  Skin:       Abscess.  Neurological: Negative for numbness.  All other systems reviewed and are negative.   Allergies  Other  Home Medications   Current Outpatient Rx  Name  Route  Sig  Dispense  Refill  . etonogestrel (IMPLANON) 68 MG IMPL implant   Subcutaneous   Inject 1 each into the skin continuous. Jan 2013         . sulfamethoxazole-trimethoprim (BACTRIM DS,SEPTRA DS) 800-160 MG per tablet   Oral   Take 1 tablet by mouth 2 (two) times daily.   14 tablet   0    BP 148/88   Pulse 105  Temp(Src) 99.2 F (37.3 C) (Oral)  Resp 20  Ht 5\' 7"  (1.702 m)  Wt 341 lb 6 oz (154.847 kg)  BMI 53.45 kg/m2  SpO2 98%  Physical Exam  Nursing note and vitals reviewed. Constitutional: She is oriented to person, place, and time. She appears well-developed and well-nourished. No distress.  HENT:  Head: Normocephalic and atraumatic.  Eyes: Conjunctivae and EOM are normal. No scleral icterus.  Neck: Normal range of motion.  Cardiovascular: Normal rate, regular rhythm and intact distal pulses.   Pulses:      Radial pulses are 2+ on the left side.  Pulmonary/Chest: Effort normal. No respiratory distress.  Musculoskeletal: Normal range of motion.  Neurological: She is alert and oriented to person, place, and time.  No gross sensory deficits appreciated. Pt moves extremities without ataxia.   Skin: Skin is warm and dry. No rash noted. She is not diaphoretic. No erythema. No pallor.  Area of induration of approximately 5 cm in diameter with central area of fluctuance of 1.5 cm along anterior axillary line. Erythema. No red linear streaking. No active drainage, weeping, or bleeding.   Psychiatric: She has a normal mood and affect. Her behavior is normal.  ED Course  Procedures (including critical care time)  DIAGNOSTIC STUDIES: Oxygen Saturation is 98% on RA, normal by my interpretation.    COORDINATION OF CARE: 10:02 PM-Discussed treatment plan which includes I&D with pt at bedside and pt agreed to plan.   INCISION AND DRAINAGE Performed by: Antony Madura, PA-C Consent: Verbal consent obtained. Risks and benefits: risks, benefits and alternatives were discussed Type: abscess  Body area: left axilla along anterior aspect  Anesthesia: local infiltration  Incision was made with a scalpel.  Local anesthetic: lidocaine 2% with epinephrine  Anesthetic total: 3.5 ml  Complexity: complex Blunt dissection to break up loculations  Drainage: purulent,  bloody  Drainage amount: moderate, approximately 3cc  Packing material: none  Patient tolerance: Patient tolerated the procedure well with no immediate complications.   Labs Review Labs Reviewed - No data to display Imaging Review No results found.  EKG Interpretation   None       MDM   1. Axillary abscess    Uncomplicated abscess of left axilla. Patient well and nontoxic appearing, hemodynamically stable, and afebrile. She is neurovascularly intact with normal sensation bilateral upper extremities. No red linear streaking appreciated. I&D performed at bedside which patient tolerated well with no immediate complications. She is stable for discharge with instruction to return in 48 hours for a recheck. Ibuprofen advised for pain control and Bactrim prescribed for symptoms. Patient agreeable to plan with no unaddressed concerns.  I personally performed the services described in this documentation, which was scribed in my presence. The recorded information has been reviewed and is accurate.    Antony Madura, PA-C 01/10/13 2303

## 2013-01-10 NOTE — ED Notes (Signed)
Abscess under left arm x 3days; getting larger; history of same

## 2013-01-10 NOTE — ED Notes (Signed)
Pt presents with an abscess under her left armpit; denies drainage.

## 2013-01-13 NOTE — ED Provider Notes (Signed)
Medical screening examination/treatment/procedure(s) were performed by non-physician practitioner and as supervising physician I was immediately available for consultation/collaboration.   Dot Lanes, MD 01/13/13 1056

## 2014-08-03 ENCOUNTER — Encounter (HOSPITAL_COMMUNITY): Payer: Self-pay

## 2014-08-03 ENCOUNTER — Emergency Department (HOSPITAL_COMMUNITY)
Admission: EM | Admit: 2014-08-03 | Discharge: 2014-08-03 | Disposition: A | Discharge status: Home / Self Care | Payer: Commercial Managed Care - PPO | Attending: Emergency Medicine | Admitting: Emergency Medicine

## 2014-08-03 DIAGNOSIS — Z872 Personal history of diseases of the skin and subcutaneous tissue: Secondary | ICD-10-CM | POA: Insufficient documentation

## 2014-08-03 DIAGNOSIS — E669 Obesity, unspecified: Secondary | ICD-10-CM | POA: Insufficient documentation

## 2014-08-03 DIAGNOSIS — J309 Allergic rhinitis, unspecified: Secondary | ICD-10-CM

## 2014-08-03 DIAGNOSIS — Z8619 Personal history of other infectious and parasitic diseases: Secondary | ICD-10-CM | POA: Insufficient documentation

## 2014-08-03 NOTE — ED Provider Notes (Signed)
CSN: [id number]     Arrival date & time 08/03/14  1107 History   First MD Initiated Contact with Patient 08/03/14 1118     Chief Complaint  Patient presents with  . Sore Throat     (Consider location/radiation/quality/duration/timing/severity/associated sxs/prior Treatment) HPI   Stacy Fowler is a 24 y.o. femaleWho presents for evaluation of sore throat generalized itching, rhinorrhea and nonproductive cough. She denies fever, chills, ear pain, weakness or dizziness. There are no other known modifying factors.   Past Medical History  Diagnosis Date  . Skin infection   . Genital herpes   . Obesity    Past Surgical History  Procedure Laterality Date  . Breast reduction surgery     Family History  Problem Relation Age of Onset  . Diabetes Other   . Hypertension Other    History  Substance Use Topics  . Smoking status: Never Smoker   . Smokeless tobacco: Not on file  . Alcohol Use: No   OB History    No data available     Review of Systems  All other systems reviewed and are negative.     Allergies  Other  Home Medications   Prior to Admission medications   Medication Sig Start Date End Date Taking? Authorizing Provider  etonogestrel (IMPLANON) 68 MG IMPL implant Inject 1 each into the skin continuous. Jan 2013    Historical Provider, MD   BP 162/100 mmHg  Pulse 100  Temp(Src) 98.2 F (36.8 C) (Oral)  Resp 18  SpO2 98%  LMP 07/26/2014 (Approximate) Physical Exam  Constitutional: She is oriented to person, place, and time. She appears well-developed and well-nourished. No distress.  HENT:  Head: Normocephalic and atraumatic.  Right Ear: External ear normal.  Left Ear: External ear normal.  No tonsillar hypertrophy or exudate.  Eyes: Conjunctivae and EOM are normal. Pupils are equal, round, and reactive to light.  Neck: Normal range of motion and phonation normal. Neck supple.  no cervical adenopathy.  Cardiovascular: Normal rate, regular rhythm and  normal heart sounds.   Pulmonary/Chest: Effort normal and breath sounds normal. She exhibits no bony tenderness.  Abdominal: Soft. There is no tenderness.  Musculoskeletal: Normal range of motion.  Neurological: She is alert and oriented to person, place, and time. No cranial nerve deficit or sensory deficit. She exhibits normal muscle tone. Coordination normal.  Skin: Skin is warm, dry and intact.  Psychiatric: She has a normal mood and affect. Her behavior is normal. Judgment and thought content normal.  Nursing note and vitals reviewed.   ED Course  Procedures (including critical care time) Medications - No data to display  Patient Vitals for the past 24 hrs:  BP Temp Temp src Pulse Resp SpO2  08/03/14 1113 162/100 mmHg 98.2 F (36.8 C) Oral 100 18 98 %    Findings discussed with patient, all questions were answered.   Labs Review Labs Reviewed - No data to display  Imaging Review No results found.   EKG Interpretation None      MDM   Final diagnoses:  Allergic rhinitis, unspecified allergic rhinitis type    Nonspecific allergic syndrome. Doubt bacterial infection, viral infection or metabolic instability.  Nursing Notes Reviewed/ Care Coordinated Applicable Imaging Reviewed Interpretation of Laboratory Data incorporated into ED treatment  The patient appears reasonably screened and/or stabilized for discharge and I doubt any other medical condition or other [facility] requiring further screening, evaluation, or treatment in the ED at this time prior to discharge.  Plan: Home Medications- antihistamine of choice; Home Treatments- Rest; return here if the recommended treatment, does not improve the symptoms; Recommended follow up- PCP when necessary     Daleen Bo, MD 08/03/14 1151

## 2014-08-03 NOTE — ED Notes (Signed)
Pt presents with c/o sore throat that initially started last night but became worse this morning. Pt reports that it is painful to swallow.

## 2014-08-03 NOTE — Discharge Instructions (Signed)
Use Claritin daily as needed for symptoms.   Allergies Allergies may happen from anything your body is sensitive to. This may be food, medicines, pollens, chemicals, and nearly anything around you in everyday life that produces allergens. An allergen is anything that causes an allergy producing substance. Heredity is often a factor in causing these problems. This means you may have some of the same allergies as your parents. Food allergies happen in all age groups. Food allergies are some of the most severe and life threatening. Some common food allergies are cow's milk, seafood, eggs, nuts, wheat, and soybeans. SYMPTOMS   Swelling around the mouth.  An itchy red rash or hives.  Vomiting or diarrhea.  Difficulty breathing. SEVERE ALLERGIC REACTIONS ARE LIFE-THREATENING. This reaction is called anaphylaxis. It can cause the mouth and throat to swell and cause difficulty with breathing and swallowing. In severe reactions only a trace amount of food (for example, peanut oil in a salad) may cause death within seconds. Seasonal allergies occur in all age groups. These are seasonal because they usually occur during the same season every year. They may be a reaction to molds, grass pollens, or tree pollens. Other causes of problems are house dust mite allergens, pet dander, and mold spores. The symptoms often consist of nasal congestion, a runny itchy nose associated with sneezing, and tearing itchy eyes. There is often an associated itching of the mouth and ears. The problems happen when you come in contact with pollens and other allergens. Allergens are the particles in the air that the body reacts to with an allergic reaction. This causes you to release allergic antibodies. Through a chain of events, these eventually cause you to release histamine into the blood stream. Although it is meant to be protective to the body, it is this release that causes your discomfort. This is why you were given  anti-histamines to feel better. If you are unable to pinpoint the offending allergen, it may be determined by skin or blood testing. Allergies cannot be cured but can be controlled with medicine. Hay fever is a collection of all or some of the seasonal allergy problems. It may often be treated with simple over-the-counter medicine such as diphenhydramine. Take medicine as directed. Do not drink alcohol or drive while taking this medicine. Check with your caregiver or package insert for child dosages. If these medicines are not effective, there are many new medicines your caregiver can prescribe. Stronger medicine such as nasal spray, eye drops, and corticosteroids may be used if the first things you try do not work well. Other treatments such as immunotherapy or desensitizing injections can be used if all else fails. Follow up with your caregiver if problems continue. These seasonal allergies are usually not life threatening. They are generally more of a nuisance that can often be handled using medicine. HOME CARE INSTRUCTIONS   If unsure what causes a reaction, keep a diary of foods eaten and symptoms that follow. Avoid foods that cause reactions.  If hives or rash are present:  Take medicine as directed.  You may use an over-the-counter antihistamine (diphenhydramine) for hives and itching as needed.  Apply cold compresses (cloths) to the skin or take baths in cool water. Avoid hot baths or showers. Heat will make a rash and itching worse.  If you are severely allergic:  Following a treatment for a severe reaction, hospitalization is often required for closer follow-up.  Wear a medic-alert bracelet or necklace stating the allergy.  You and your  family must learn how to give adrenaline or use an anaphylaxis kit.  If you have had a severe reaction, always carry your anaphylaxis kit or EpiPen with you. Use this medicine as directed by your caregiver if a severe reaction is occurring. Failure  to do so could have a fatal outcome. SEEK MEDICAL CARE IF:  You suspect a food allergy. Symptoms generally happen within 30 minutes of eating a food.  Your symptoms have not gone away within 2 days or are getting worse.  You develop new symptoms.  You want to retest yourself or your child with a food or drink you think causes an allergic reaction. Never do this if an anaphylactic reaction to that food or drink has happened before. Only do this under the care of a caregiver. SEEK IMMEDIATE MEDICAL CARE IF:   You have difficulty breathing, are wheezing, or have a tight feeling in your chest or throat.  You have a swollen mouth, or you have hives, swelling, or itching all over your body.  You have had a severe reaction that has responded to your anaphylaxis kit or an EpiPen. These reactions may return when the medicine has worn off. These reactions should be considered life threatening. MAKE SURE YOU:   Understand these instructions.  Will watch your condition.  Will get help right away if you are not doing well or get worse. Document Released: 03/25/2002 Document Revised: 04/26/2012 Document Reviewed: 08/30/2007 [facility] Patient Information 2015 [place], [place]. This information is not intended to replace advice given to you by your health care provider. Make sure you discuss any questions you have with your health care provider.

## 2014-10-12 ENCOUNTER — Other Ambulatory Visit: Payer: Self-pay | Admitting: Obstetrics and Gynecology

## 2014-10-13 LAB — CYTOLOGY - PAP

## 2015-01-13 ENCOUNTER — Encounter (HOSPITAL_COMMUNITY): Payer: Self-pay | Admitting: Emergency Medicine

## 2015-01-13 ENCOUNTER — Emergency Department (HOSPITAL_COMMUNITY)
Admission: EM | Admit: 2015-01-13 | Discharge: 2015-01-13 | Disposition: A | Discharge status: Home / Self Care | Payer: Commercial Managed Care - PPO | Attending: Emergency Medicine | Admitting: Emergency Medicine

## 2015-01-13 DIAGNOSIS — L03319 Cellulitis of trunk, unspecified: Secondary | ICD-10-CM

## 2015-01-13 DIAGNOSIS — L03112 Cellulitis of left axilla: Secondary | ICD-10-CM | POA: Insufficient documentation

## 2015-01-13 DIAGNOSIS — L02412 Cutaneous abscess of left axilla: Secondary | ICD-10-CM | POA: Insufficient documentation

## 2015-01-13 DIAGNOSIS — L02219 Cutaneous abscess of trunk, unspecified: Secondary | ICD-10-CM

## 2015-01-13 DIAGNOSIS — R630 Anorexia: Secondary | ICD-10-CM | POA: Insufficient documentation

## 2015-01-13 DIAGNOSIS — Z8619 Personal history of other infectious and parasitic diseases: Secondary | ICD-10-CM | POA: Insufficient documentation

## 2015-01-13 DIAGNOSIS — E669 Obesity, unspecified: Secondary | ICD-10-CM | POA: Insufficient documentation

## 2015-01-13 MED ORDER — SULFAMETHOXAZOLE-TRIMETHOPRIM 800-160 MG PO TABS: 1.0000 | ORAL_TABLET | Freq: Two times a day (BID) | ORAL | Status: DC

## 2015-01-13 MED ORDER — IBUPROFEN 200 MG PO TABS
600.0000 mg | ORAL_TABLET | Freq: Once | ORAL | Status: AC
  Administered 2015-01-13: 600 mg via ORAL
  Filled 2015-01-13: qty 3

## 2015-01-13 MED ORDER — LIDOCAINE-EPINEPHRINE (PF) 2 %-1:200000 IJ SOLN
20.0000 mL | Freq: Once | INTRAMUSCULAR | Status: AC
  Administered 2015-01-13: 20 mL
  Filled 2015-01-13: qty 20

## 2015-01-13 MED ORDER — HYDROCODONE-ACETAMINOPHEN 5-325 MG PO TABS: 1.0000 | ORAL_TABLET | ORAL | Status: DC | PRN

## 2015-01-13 MED ORDER — CEPHALEXIN 500 MG PO CAPS: 500.0000 mg | ORAL_CAPSULE | Freq: Four times a day (QID) | ORAL | Status: DC

## 2015-01-13 NOTE — ED Notes (Signed)
Pt states she has had a boil under her L axilla x 2 days. Hx of same. Not draining. Alert and oriented.

## 2015-01-13 NOTE — ED Provider Notes (Signed)
CSN: [id number]     Arrival date & time 01/13/15  1931 History  By signing my name below, I, Terressa Koyanagi, attest that this documentation has been prepared under the direction and in the presence of [facility], PA-C. Electronically Signed: Terressa Koyanagi, ED Scribe. 01/13/2015. 8:56 PM.   Chief Complaint  Patient presents with  . Skin Problem   The history is provided by the patient. No language interpreter was used.   PCP: Marylynn Pearson, MD HPI Comments: Stacy Fowler is a 24 y.o. female, with PMHx noted below including Hx of abscess, who presents to the Emergency Department complaining of worsening abscess under the left axilla with associated pain onset 1 week ago. Pt denies drainage. Associated Sx include decreased appetite secondary to pain. Pt reports applying warm compresses and using 400mg  of ibuprofen at home with minimal relief. Pt denies fever, chills, body aches, or any other Sx at this time.   Past Medical History  Diagnosis Date  . Skin infection   . Genital herpes   . Obesity    Past Surgical History  Procedure Laterality Date  . Breast reduction surgery     Family History  Problem Relation Age of Onset  . Diabetes Other   . Hypertension Other    Social History  Substance Use Topics  . Smoking status: Never Smoker   . Smokeless tobacco: None  . Alcohol Use: No   OB History    No data available     Review of Systems  Constitutional: Positive for appetite change (decreased appetite secondary to pain). Negative for fever and chills.  Musculoskeletal: Negative for myalgias and neck stiffness.  Skin: Positive for color change and wound.  Allergic/Immunologic: Negative for immunocompromised state.  Hematological: Does not bruise/bleed easily.  Psychiatric/Behavioral: Negative for self-injury.   Allergies  Other  Home Medications   Prior to Admission medications   Medication Sig Start Date End Date Taking? Authorizing Provider  etonogestrel (IMPLANON) 68  MG IMPL implant Inject 1 each into the skin continuous. Jan 2013    Historical Provider, MD   Triage Vitals: BP 142/99 mmHg  Pulse 121  Temp(Src) 98.6 F (37 C) (Oral)  Resp 20  Ht 5\' 7"  (1.702 m)  Wt 311 lb 11.2 oz (141.386 kg)  BMI 48.81 kg/m2  SpO2 96%  LMP 01/09/2015 (Exact Date) Physical Exam  Constitutional: She appears well-developed and well-nourished. No distress.  HENT:  Head: Normocephalic and atraumatic.  Neck: Neck supple.  Pulmonary/Chest: Effort normal.  Neurological: She is alert.  Skin: She is not diaphoretic.  Skin inferior to left axilla with area of fluctuance approximately 3cm and tenderness, erythema, and surrounding localized erythema, no drainage-- localized erythema estimated 12x6 cm.   Nursing note and vitals reviewed.  INCISION AND DRAINAGE Performed by: Clayton Bibles, PA-C Consent: Verbal consent obtained. Risks and benefits: risks, benefits and alternatives were discussed Type: abscess  Body area: skin inferior to left axilla   Anesthesia: local infiltration  Incision was made with a scalpel.  Local anesthetic: lidocaine 2% with epinephrine  Anesthetic total: 5 ml  Complexity: complex Blunt dissection to break up loculations  Drainage: purulent  Drainage amount: large   Patient tolerance: Patient tolerated the procedure well with no immediate complications.  ED Course  Procedures (including critical care time) DIAGNOSTIC STUDIES: Oxygen Saturation is 96% on ra, nl by my interpretation.    COORDINATION OF CARE: 8:20 PM: Discussed treatment plan which includes I&D, antibiotics, meds for pain, wound management, recheck in 2-3  days with pt at bedside; patient verbalizes understanding and agrees with treatment plan.  MDM   Final diagnoses:  Cellulitis and abscess of trunk    Afebrile, nontoxic patient with abscess with surrounding cellulitis of skin inferior to axilla, involving area superficial area of extra fatty tissue.  I&D with  great success and pt's relief.   D/C home with keflex, bactrim, norco, recheck in 2 days, strict return precautions.  Discussed result, findings, treatment, and follow up  with patient.  Pt given return precautions.  Pt verbalizes understanding and agrees with plan.       I personally performed the services described in this documentation, which was scribed in my presence. The recorded information has been reviewed and is accurate.    Clayton Bibles, PA-C 01/14/15 0021  Forde Dandy, MD 01/16/15 [phone]

## 2015-01-13 NOTE — Discharge Instructions (Signed)
Read the information below.  Use the prescribed medication as directed.  Please discuss all new medications with your pharmacist.  Do not take additional tylenol while taking the prescribed pain medication to avoid overdose.  You may return to the Emergency Department at any time for worsening condition or any new symptoms that concern you.  If there is any possibility that you might be pregnant, please let your health care provider know and discuss this with the pharmacist to ensure medication safety.  If you develop increased redness, swelling, uncontrolled pain, or fevers greater than 100.4, return to the ER immediately for a recheck.     Incision and Drainage Incision and drainage is a procedure in which a sac-like structure (cystic structure) is opened and drained. The area to be drained usually contains material such as pus, fluid, or blood.  LET YOUR CAREGIVER KNOW ABOUT:   Allergies to medicine.  Medicines taken, including vitamins, herbs, eyedrops, over-the-counter medicines, and creams.  Use of steroids (by mouth or creams).  Previous problems with anesthetics or numbing medicines.  History of bleeding problems or blood clots.  Previous surgery.  Other health problems, including diabetes and kidney problems.  Possibility of pregnancy, if this applies. RISKS AND COMPLICATIONS  Pain.  Bleeding.  Scarring.  Infection. BEFORE THE PROCEDURE  You may need to have an ultrasound or other imaging tests to see how large or deep your cystic structure is. Blood tests may also be used to determine if you have an infection or how severe the infection is. You may need to have a tetanus shot. PROCEDURE  The affected area is cleaned with a cleaning fluid. The cyst area will then be numbed with a medicine (local anesthetic). A small incision will be made in the cystic structure. A syringe or catheter may be used to drain the contents of the cystic structure, or the contents may be squeezed  out. The area will then be flushed with a cleansing solution. After cleansing the area, it is often gently packed with a gauze or another wound dressing. Once it is packed, it will be covered with gauze and tape or some other type of wound dressing. AFTER THE PROCEDURE   Often, you will be allowed to go home right after the procedure.  You may be given antibiotic medicine to prevent or heal an infection.  If the area was packed with gauze or some other wound dressing, you will likely need to come back in 1 to 2 days to get it removed.  The area should heal in about 14 days.   This information is not intended to replace advice given to you by your health care provider. Make sure you discuss any questions you have with your health care provider.   Document Released: 06/25/2000 Document Revised: 07/01/2011 Document Reviewed: 02/24/2011 Elsevier Interactive Patient Education 2016 [employer]  Abscess An abscess is an infected area that contains a collection of pus and debris.It can occur in almost any part of the body. An abscess is also known as a furuncle or boil. CAUSES  An abscess occurs when tissue gets infected. This can occur from blockage of oil or sweat glands, infection of hair follicles, or a minor injury to the skin. As the body tries to fight the infection, pus collects in the area and creates pressure under the skin. This pressure causes pain. People with weakened immune systems have difficulty fighting infections and get certain abscesses more often.  SYMPTOMS Usually an abscess develops on  the skin and becomes a painful mass that is red, warm, and tender. If the abscess forms under the skin, you may feel a moveable soft area under the skin. Some abscesses break open (rupture) on their own, but most will continue to get worse without care. The infection can spread deeper into the body and eventually into the bloodstream, causing you to feel ill.  DIAGNOSIS  Your caregiver will  take your medical history and perform a physical exam. A sample of fluid may also be taken from the abscess to determine what is causing your infection. TREATMENT  Your caregiver may prescribe antibiotic medicines to fight the infection. However, taking antibiotics alone usually does not cure an abscess. Your caregiver may need to make a small cut (incision) in the abscess to drain the pus. In some cases, gauze is packed into the abscess to reduce pain and to continue draining the area. HOME CARE INSTRUCTIONS   Only take over-the-counter or prescription medicines for pain, discomfort, or fever as directed by your caregiver.  If you were prescribed antibiotics, take them as directed. Finish them even if you start to feel better.  If gauze is used, follow your caregiver's directions for changing the gauze.  To avoid spreading the infection:  Keep your draining abscess covered with a bandage.  Wash your hands well.  Do not share personal care items, towels, or whirlpools with others.  Avoid skin contact with others.  Keep your skin and clothes clean around the abscess.  Keep all follow-up appointments as directed by your caregiver. SEEK MEDICAL CARE IF:   You have increased pain, swelling, redness, fluid drainage, or bleeding.  You have muscle aches, chills, or a general ill feeling.  You have a fever. MAKE SURE YOU:   Understand these instructions.  Will watch your condition.  Will get help right away if you are not doing well or get worse.   This information is not intended to replace advice given to you by your health care provider. Make sure you discuss any questions you have with your health care provider.   Document Released: 10/09/2004 Document Revised: 07/01/2011 Document Reviewed: 03/14/2011 Elsevier Interactive Patient Education [employer].

## 2016-03-10 ENCOUNTER — Ambulatory Visit (HOSPITAL_COMMUNITY)
Admission: EM | Admit: 2016-03-10 | Discharge: 2016-03-10 | Disposition: A | Discharge status: Home / Self Care | Payer: BLUE CROSS/BLUE SHIELD | Attending: Family Medicine | Admitting: Family Medicine

## 2016-03-10 ENCOUNTER — Encounter (HOSPITAL_COMMUNITY): Payer: Self-pay | Admitting: Family Medicine

## 2016-03-10 DIAGNOSIS — J029 Acute pharyngitis, unspecified: Secondary | ICD-10-CM | POA: Diagnosis present

## 2016-03-10 DIAGNOSIS — J020 Streptococcal pharyngitis: Secondary | ICD-10-CM | POA: Diagnosis not present

## 2016-03-10 LAB — POCT RAPID STREP A: STREPTOCOCCUS, GROUP A SCREEN (DIRECT): NEGATIVE

## 2016-03-10 MED ORDER — CLINDAMYCIN HCL 150 MG PO CAPS: 150.0000 mg | ORAL_CAPSULE | Freq: Four times a day (QID) | ORAL | 0 refills | Status: DC

## 2016-03-10 NOTE — ED Provider Notes (Signed)
[place]    CSN: [id number] Arrival date & time: 03/10/16  1049     History   Chief Complaint Chief Complaint  Patient presents with  . Sore Throat    HPI Stacy Fowler is a 26 y.o. female.   The history is provided by the patient.  Sore Throat  This is a new problem. The current episode started more than 2 days ago. The problem has been rapidly worsening. Pertinent negatives include no chest pain and no abdominal pain. The symptoms are aggravated by swallowing.    Past Medical History:  Diagnosis Date  . Genital herpes   . Obesity   . Skin infection     Patient Active Problem List   Diagnosis Date Noted  . HYPERTENSION, BENIGN ESSENTIAL 11/26/2009  . ACUTE CYSTITIS 11/26/2009  . GENITAL HERPES 04/26/2009  . MORBID OBESITY 04/26/2009    Past Surgical History:  Procedure Laterality Date  . BREAST REDUCTION SURGERY      OB History    No data available       Home Medications    Prior to Admission medications   Medication Sig Start Date End Date Taking? Authorizing Provider  clindamycin (CLEOCIN) 150 MG capsule Take 1 capsule (150 mg total) by mouth 4 (four) times daily. 03/10/16   Billy Fischer, MD  etonogestrel (IMPLANON) 68 MG IMPL implant Inject 1 each into the skin continuous. Jan 2013    Historical Provider, MD    Family History Family History  Problem Relation Age of Onset  . Diabetes Other   . Hypertension Other     Social History Social History  Substance Use Topics  . Smoking status: Never Smoker  . Smokeless tobacco: Never Used  . Alcohol use No     Allergies   Other   Review of Systems Review of Systems  Constitutional: Positive for chills and fever. Negative for appetite change.  HENT: Positive for sore throat. Negative for congestion, postnasal drip and rhinorrhea.   Respiratory: Negative.   Cardiovascular: Negative.  Negative for chest pain.  Gastrointestinal: Negative.  Negative for abdominal pain.    Hematological: Negative for adenopathy.  All other systems reviewed and are negative.    Physical Exam Triage Vital Signs ED Triage Vitals  Enc Vitals Group     BP 03/10/16 1134 135/79     Pulse Rate 03/10/16 1134 92     Resp 03/10/16 1134 18     Temp 03/10/16 1134 99 F (37.2 C)     Temp src --      SpO2 03/10/16 1134 100 %     Weight --      Height --      Head Circumference --      Peak Flow --      Pain Score 03/10/16 1133 10     Pain Loc --      Pain Edu? --      Excl. in [place]? --    No data found.   Updated Vital Signs BP 135/79   Pulse 92   Temp 99 F (37.2 C)   Resp 18   LMP 01/31/2016   SpO2 100%   Visual Acuity Right Eye Distance:   Left Eye Distance:   Bilateral Distance:    Right Eye Near:   Left Eye Near:    Bilateral Near:     Physical Exam  Constitutional: She is oriented to person, place, and time. She appears well-developed and well-nourished.  HENT:  Right Ear: External ear normal.  Left Ear: External ear normal.  Mouth/Throat: Oropharyngeal exudate present.  Neck: Normal range of motion. Neck supple.  Cardiovascular: Normal rate and regular rhythm.   Pulmonary/Chest: Effort normal and breath sounds normal.  Abdominal: Soft. Bowel sounds are normal.  Lymphadenopathy:    She has cervical adenopathy.  Neurological: She is alert and oriented to person, place, and time.  Skin: Skin is warm and dry.  Nursing note and vitals reviewed.    UC Treatments / Results  Labs (all labs ordered are listed, but only abnormal results are displayed) Labs Reviewed  POCT RAPID STREP A  strep neg.  EKG  EKG Interpretation None       Radiology No results found.  Procedures Procedures (including critical care time)  Medications Ordered in UC Medications - No data to display   Initial Impression / Assessment and Plan / UC Course  I have reviewed the triage vital signs and the nursing notes.  Pertinent labs & imaging results that were  available during my care of the patient were reviewed by me and considered in my medical decision making (see chart for details).       Final Clinical Impressions(s) / UC Diagnoses   Final diagnoses:  Acute streptococcal pharyngitis    New Prescriptions Discharge Medication List as of 03/10/2016 12:50 PM    START taking these medications   Details  clindamycin (CLEOCIN) 150 MG capsule Take 1 capsule (150 mg total) by mouth 4 (four) times daily., Starting Mon 03/10/2016, Print         Billy Fischer, MD 03/10/16 1302

## 2016-03-10 NOTE — ED Notes (Signed)
Bed: UC03 Expected date: 03/10/16 Expected time: 12:00 PM Means of arrival:  Comments:

## 2016-03-10 NOTE — ED Triage Notes (Signed)
Pt here for sore throat x 3 days and body aches. sts cold chills.

## 2016-03-10 NOTE — Discharge Instructions (Signed)
Drink lots of fluids, take all of medicine, use lozenges as needed.return if needed °

## 2016-03-13 LAB — CULTURE, GROUP A STREP (THRC)

## 2016-07-28 DIAGNOSIS — B977 Papillomavirus as the cause of diseases classified elsewhere: Secondary | ICD-10-CM | POA: Insufficient documentation

## 2016-08-18 ENCOUNTER — Other Ambulatory Visit: Payer: Self-pay | Admitting: Obstetrics and Gynecology

## 2017-04-21 ENCOUNTER — Encounter (HOSPITAL_COMMUNITY): Payer: Self-pay

## 2017-04-21 DIAGNOSIS — J22 Unspecified acute lower respiratory infection: Secondary | ICD-10-CM | POA: Diagnosis not present

## 2017-04-21 DIAGNOSIS — B9789 Other viral agents as the cause of diseases classified elsewhere: Secondary | ICD-10-CM | POA: Diagnosis not present

## 2017-04-21 DIAGNOSIS — R05 Cough: Secondary | ICD-10-CM | POA: Diagnosis present

## 2017-04-21 LAB — GROUP A STREP BY PCR: GROUP A STREP BY PCR: NOT DETECTED

## 2017-04-21 NOTE — ED Triage Notes (Signed)
Pt complains of a cough for one week, she now says that her throat is sore and she feels achy

## 2017-04-22 ENCOUNTER — Emergency Department (HOSPITAL_COMMUNITY)
Admission: EM | Admit: 2017-04-22 | Discharge: 2017-04-22 | Disposition: A | Discharge status: Home / Self Care | Payer: BLUE CROSS/BLUE SHIELD | Attending: Emergency Medicine | Admitting: Emergency Medicine

## 2017-04-22 ENCOUNTER — Emergency Department (HOSPITAL_COMMUNITY): Payer: BLUE CROSS/BLUE SHIELD

## 2017-04-22 DIAGNOSIS — B9789 Other viral agents as the cause of diseases classified elsewhere: Secondary | ICD-10-CM

## 2017-04-22 DIAGNOSIS — J988 Other specified respiratory disorders: Secondary | ICD-10-CM

## 2017-04-22 MED ORDER — BENZONATATE 100 MG PO CAPS
200.0000 mg | ORAL_CAPSULE | Freq: Once | ORAL | Status: AC
  Administered 2017-04-22: 200 mg via ORAL
  Filled 2017-04-22: qty 2

## 2017-04-22 MED ORDER — IBUPROFEN 800 MG PO TABS
800.0000 mg | ORAL_TABLET | Freq: Once | ORAL | Status: AC
  Administered 2017-04-22: 800 mg via ORAL
  Filled 2017-04-22: qty 1

## 2017-04-22 MED ORDER — IBUPROFEN 600 MG PO TABS: 600.0000 mg | ORAL_TABLET | Freq: Four times a day (QID) | ORAL | 0 refills | Status: DC | PRN

## 2017-04-22 MED ORDER — BENZONATATE 100 MG PO CAPS: 100.0000 mg | ORAL_CAPSULE | Freq: Three times a day (TID) | ORAL | 0 refills | Status: DC

## 2017-04-22 NOTE — ED Notes (Signed)
Bed: WA08 Expected date:  Expected time:  Means of arrival:  Comments: 

## 2017-04-22 NOTE — ED Provider Notes (Signed)
[place] DEPT Provider Note   CSN: [id number] Arrival date & time: 04/21/17  2032     History   Chief Complaint Chief Complaint  Patient presents with  . Cough    HPI Stacy Fowler is a 27 y.o. female.   27 year old female presents to the emergency department for evaluation of a cough for 1 week.  She states that cough has been worsening over the past few days and has become associated with generalized body aches.  Cough is productive of yellow sputum.  She had increasing fatigue and subjective fever prior to arrival.  She has been using Allegra for her symptoms without relief.  She denies any known sick contacts.  No inability to swallow, drooling, nausea, vomiting, hemoptysis.  She is not on birth control and denies any leg swelling, recent surgeries, recent hospitalizations.     Past Medical History:  Diagnosis Date  . Genital herpes   . Obesity   . Skin infection     Patient Active Problem List   Diagnosis Date Noted  . HYPERTENSION, BENIGN ESSENTIAL 11/26/2009  . ACUTE CYSTITIS 11/26/2009  . GENITAL HERPES 04/26/2009  . MORBID OBESITY 04/26/2009    Past Surgical History:  Procedure Laterality Date  . BREAST REDUCTION SURGERY       OB History   None      Home Medications    Prior to Admission medications   Medication Sig Start Date End Date Taking? Authorizing Provider  sodium chloride (OCEAN) 0.65 % SOLN nasal spray Place 1 spray into both nostrils 3 (three) times daily as needed for congestion.   Yes [provider]  benzonatate (TESSALON) 100 MG capsule Take 1 capsule (100 mg total) by mouth every 8 (eight) hours. 04/22/17   Antonietta Breach, PA-C  clindamycin (CLEOCIN) 150 MG capsule Take 1 capsule (150 mg total) by mouth 4 (four) times daily. Patient not taking: Reported on 04/22/2017 03/10/16   Billy Fischer, MD  ibuprofen (ADVIL,MOTRIN) 600 MG tablet Take 1 tablet (600 mg total) by mouth every 6 (six) hours as  needed for mild pain or moderate pain. 04/22/17   Antonietta Breach, PA-C    Family History Family History  Problem Relation Age of Onset  . Diabetes Other   . Hypertension Other     Social History Social History   Tobacco Use  . Smoking status: Never Smoker  . Smokeless tobacco: Never Used  Substance Use Topics  . Alcohol use: No  . Drug use: No     Allergies   Other   Review of Systems Review of Systems Ten systems reviewed and are negative for acute change, except as noted in the HPI.    Physical Exam Updated Vital Signs BP (!) 147/96 (BP Location: Left Arm)   Pulse 97   Temp 100 F (37.8 C) (Oral)   Resp 14   LMP 03/31/2017   SpO2 100%   Physical Exam  Constitutional: She is oriented to person, place, and time. She appears well-developed and well-nourished. No distress.  Obese AA female. Nontoxic.  HENT:  Head: Normocephalic and atraumatic.  Nasal mucosal edema and congestion. Tolerating secretions.  Eyes: Conjunctivae and EOM are normal. No scleral icterus.  Neck: Normal range of motion.  No meningismus  Cardiovascular: Normal rate, regular rhythm and intact distal pulses.  Pulmonary/Chest: Effort normal. No stridor. No respiratory distress. She has no wheezes. She has no rales.  Respirations even and unlabored. Lungs CTAB.  Musculoskeletal: Normal range of  motion.  Neurological: She is alert and oriented to person, place, and time. She exhibits normal muscle tone. Coordination normal.  Skin: Skin is warm and dry. No rash noted. She is not diaphoretic. No erythema. No pallor.  Psychiatric: She has a normal mood and affect. Her behavior is normal.  Nursing note and vitals reviewed.    ED Treatments / Results  Labs (all labs ordered are listed, but only abnormal results are displayed) Labs Reviewed  GROUP A STREP BY PCR    EKG None  Radiology Dg Chest 2 View  Result Date: 04/22/2017 CLINICAL DATA:  Initial evaluation for acute cough, fever, body  aches. EXAM: CHEST - 2 VIEW COMPARISON:  Prior radiograph from 11/26/2005. FINDINGS: The cardiac and mediastinal silhouettes are stable in size and contour, and remain within normal limits. The lungs are normally inflated. No airspace consolidation, pleural effusion, or pulmonary edema is identified. There is no pneumothorax. No acute osseous abnormality identified. IMPRESSION: No active cardiopulmonary disease. Electronically Signed   By: Jeannine Boga M.D.   On: 04/22/2017 03:47    Procedures Procedures (including critical care time)  Medications Ordered in ED Medications  ibuprofen (ADVIL,MOTRIN) tablet 800 mg (800 mg Oral Given 04/22/17 0349)  benzonatate (TESSALON) capsule 200 mg (200 mg Oral Given 04/22/17 0349)     Initial Impression / Assessment and Plan / ED Course  I have reviewed the triage vital signs and the nursing notes.  Pertinent labs & imaging results that were available during my care of the patient were reviewed by me and considered in my medical decision making (see chart for details).     CXR negative for acute infiltrate. Patient's symptoms are consistent with URI, likely viral etiology. Discussed that antibiotics are not indicated for viral infections. Patient will be discharged with symptomatic treatment.  She verbalizes understanding and is agreeable with plan. Patient is hemodynamically stable and in NAD prior to discharge.   Final Clinical Impressions(s) / ED Diagnoses   Final diagnoses:  Viral respiratory illness    ED Discharge Orders        Ordered    benzonatate (TESSALON) 100 MG capsule  Every 8 hours     04/22/17 0408    ibuprofen (ADVIL,MOTRIN) 600 MG tablet  Every 6 hours PRN     04/22/17 0408       Antonietta Breach, PA-C 04/22/17 0408    Molpus, Jenny Reichmann, MD 04/22/17 7948

## 2017-04-22 NOTE — Discharge Instructions (Signed)
We recommend ibuprofen for pain or body aches.  You may take Tessalon as prescribed for cough.  Continue with over-the-counter medication such as Allegra, Claritin, or Zyrtec.  You may supplement this with Mucinex.  Drink plenty of fluids and get plenty of rest.  Follow-up with your primary care doctor to ensure resolution of symptoms.

## 2017-04-22 NOTE — ED Notes (Signed)
Patient given chicken noodle soup and crackers with meds

## 2017-04-23 ENCOUNTER — Ambulatory Visit (HOSPITAL_COMMUNITY)
Admission: EM | Admit: 2017-04-23 | Discharge: 2017-04-23 | Disposition: A | Discharge status: Home / Self Care | Payer: BLUE CROSS/BLUE SHIELD | Attending: Internal Medicine | Admitting: Internal Medicine

## 2017-04-23 ENCOUNTER — Other Ambulatory Visit: Payer: Self-pay

## 2017-04-23 ENCOUNTER — Encounter (HOSPITAL_COMMUNITY): Payer: Self-pay | Admitting: Emergency Medicine

## 2017-04-23 DIAGNOSIS — B349 Viral infection, unspecified: Secondary | ICD-10-CM

## 2017-04-23 MED ORDER — ONDANSETRON 4 MG PO TBDP: 4.0000 mg | ORAL_TABLET | Freq: Three times a day (TID) | ORAL | 0 refills | Status: DC | PRN

## 2017-04-23 MED ORDER — FLUTICASONE PROPIONATE 50 MCG/ACT NA SUSP: 2.0000 | Freq: Every day | NASAL | 0 refills | Status: DC

## 2017-04-23 MED ORDER — IPRATROPIUM BROMIDE 0.06 % NA SOLN: 2.0000 | Freq: Four times a day (QID) | NASAL | 0 refills | Status: DC

## 2017-04-23 MED ORDER — AMOXICILLIN-POT CLAVULANATE 875-125 MG PO TABS: 1.0000 | ORAL_TABLET | Freq: Two times a day (BID) | ORAL | 0 refills | Status: DC

## 2017-04-23 NOTE — Discharge Instructions (Signed)
Tessalon for cough as per the Ed. Zofran for nausea/vomiting. Start flonase, atrovent nasal spray for nasal congestion/drainage. You can use over the counter nasal saline rinse such as neti pot for nasal congestion. As discussed, you can also use afrin to help with congestion, but don't use more than 3 days as it can cause rebound affected. Keep hydrated, your urine should be clear to pale yellow in color. Tylenol/motrin for fever and pain. Monitor for any worsening of symptoms, chest pain, shortness of breath, wheezing, swelling of the throat, follow up for reevaluation.   If symptoms does not improve by 4/16, can fill augmentin for sinus infection.  For sore throat try using a honey-based tea. Use 3 teaspoons of honey with juice squeezed from half lemon. Place shaved pieces of ginger into 1/2-1 cup of water and warm over stove top. Then mix the ingredients and repeat every 4 hours as needed.

## 2017-04-23 NOTE — ED Triage Notes (Signed)
Patient seen at [facility] long for uri.  Patient feels slightly better.  Patient reports seen in ed 4/9 and discharged home 4/10.  Patient says she received a note for one day and told for any more time, go to pcp or ucc.

## 2017-04-23 NOTE — ED Provider Notes (Signed)
[place]    CSN: [id number] Arrival date & time: 04/23/17  1709     History   Chief Complaint Chief Complaint  Patient presents with  . URI    HPI Stacy Fowler is a 27 y.o. female.   27 year old female comes in for URI follow-up.  She was seen in the emergency department yesterday, negative chest x-ray, and was diagnosed with viral URI.  Has been taking Tessalon and ibuprofen.  States had one episode of vomiting today with nausea.  Has also had a few episodes of diarrhea without blood.  Continues to have nasal congestion, rhinorrhea, productive cough, body aches.  Denies fever, chills, night sweats. States that she checked in at the emergency department on 4/9 and was not discharged until 4/10. At that time, she received a work note that told her she could return to work on 4/10.  States she gave the emergency department a call, and was told that she could return to have the work note changed.  However, when she arrived at the emergency department, the told her to go to a primary care doctor or urgent care for a work note.  States she came here for work note, given symptoms not improving and would like a few more days off.   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  Symptoms worsened 2 days ago, and therefore went to the emergency department for evaluation.     Past Medical History:  Diagnosis Date  . Genital herpes   . Obesity   . Skin infection     Patient Active Problem List   Diagnosis Date Noted  . HYPERTENSION, BENIGN ESSENTIAL 11/26/2009  . ACUTE CYSTITIS 11/26/2009  . GENITAL HERPES 04/26/2009  . MORBID OBESITY 04/26/2009    Past Surgical History:  Procedure Laterality Date  . BREAST REDUCTION SURGERY      OB History   None      Home Medications    Prior to Admission medications   Medication Sig Start Date End Date Taking? Authorizing Provider  benzonatate  (TESSALON) 100 MG capsule Take 1 capsule (100 mg total) by mouth every 8 (eight) hours. 04/22/17  Yes Antonietta Breach, PA-C  ibuprofen (ADVIL,MOTRIN) 600 MG tablet Take 1 tablet (600 mg total) by mouth every 6 (six) hours as needed for mild pain or moderate pain. 04/22/17  Yes Antonietta Breach, PA-C  amoxicillin-clavulanate (AUGMENTIN) 875-125 MG tablet Take 1 tablet by mouth every 12 (twelve) hours. Can fill on 4/16 if symptoms not improving 04/28/17   Tasia Catchings, Kaylean Tupou V, PA-C  clindamycin (CLEOCIN) 150 MG capsule Take 1 capsule (150 mg total) by mouth 4 (four) times daily. Patient not taking: Reported on 04/22/2017 03/10/16   Billy Fischer, MD  fluticasone [facility]) 50 MCG/ACT nasal spray Place 2 sprays into both nostrils daily. 04/23/17   Tasia Catchings, Sammuel Blick V, PA-C  ipratropium (ATROVENT) 0.06 % nasal spray Place 2 sprays into both nostrils 4 (four) times daily. 04/23/17   Tasia Catchings, Cherrill Scrima V, PA-C  ondansetron (ZOFRAN ODT) 4 MG disintegrating tablet Take 1 tablet (4 mg total) by mouth every 8 (eight) hours as needed for nausea or vomiting. 04/23/17   Tasia Catchings, Lavanda Nevels V, PA-C  sodium chloride (OCEAN) 0.65 % SOLN nasal spray Place 1 spray into both nostrils 3 (three) times daily as needed for congestion.    [provider]    Family History Family History  Problem Relation Age of  Onset  . Diabetes Other   . Hypertension Other     Social History Social History   Tobacco Use  . Smoking status: Never Smoker  . Smokeless tobacco: Never Used  Substance Use Topics  . Alcohol use: No  . Drug use: No     Allergies   Other   Review of Systems Review of Systems  Reason unable to perform ROS: See HPI as above.     Physical Exam Triage Vital Signs ED Triage Vitals  Enc Vitals Group     BP 04/23/17 1812 (!) 142/85     Pulse Rate 04/23/17 1812 (!) 110     Resp 04/23/17 1812 (!) 24     Temp 04/23/17 1812 99.3 F (37.4 C)     Temp Source 04/23/17 1812 Oral     SpO2 04/23/17 1812 99 %     Weight --      Height --       Head Circumference --      Peak Flow --      Pain Score 04/23/17 1808 9     Pain Loc --      Pain Edu? --      Excl. in [place]? --    No data found.  Updated Vital Signs BP (!) 142/85 (BP Location: Left Arm) Comment (BP Location): large cuff  Pulse (!) 110   Temp 99.3 F (37.4 C) (Oral)   Resp (!) 24   LMP 03/31/2017   SpO2 99%   Physical Exam  Constitutional: She is oriented to person, place, and time. She appears well-developed and well-nourished. No distress.  HENT:  Head: Normocephalic and atraumatic.  Right Ear: Tympanic membrane, external ear and ear canal normal. Tympanic membrane is not erythematous and not bulging.  Left Ear: Tympanic membrane, external ear and ear canal normal. Tympanic membrane is not erythematous and not bulging.  Nose: Mucosal edema and rhinorrhea present. Right sinus exhibits maxillary sinus tenderness and frontal sinus tenderness. Left sinus exhibits maxillary sinus tenderness and frontal sinus tenderness.  Mouth/Throat: Uvula is midline, oropharynx is clear and moist and mucous membranes are normal.  Eyes: Pupils are equal, round, and reactive to light. Conjunctivae are normal.  Neck: Normal range of motion. Neck supple.  Cardiovascular: Normal rate, regular rhythm and normal heart sounds. Exam reveals no gallop and no friction rub.  No murmur heard. Pulmonary/Chest: Effort normal and breath sounds normal. She has no decreased breath sounds. She has no wheezes. She has no rhonchi. She has no rales.  Lymphadenopathy:    She has no cervical adenopathy.  Neurological: She is alert and oriented to person, place, and time.  Skin: Skin is warm and dry.  Psychiatric: She has a normal mood and affect. Her behavior is normal. Judgment normal.     UC Treatments / Results  Labs (all labs ordered are listed, but only abnormal results are displayed) Labs Reviewed - No data to display  EKG None Radiology Dg Chest 2 View  Result Date: 04/22/2017 CLINICAL  DATA:  Initial evaluation for acute cough, fever, body aches. EXAM: CHEST - 2 VIEW COMPARISON:  Prior radiograph from 11/26/2005. FINDINGS: The cardiac and mediastinal silhouettes are stable in size and contour, and remain within normal limits. The lungs are normally inflated. No airspace consolidation, pleural effusion, or pulmonary edema is identified. There is no pneumothorax. No acute osseous abnormality identified. IMPRESSION: No active cardiopulmonary disease. Electronically Signed   By: Jeannine Boga M.D.   On: 04/22/2017  03:47    Procedures Procedures (including critical care time)  Medications Ordered in UC Medications - No data to display   Initial Impression / Assessment and Plan / UC Course  I have reviewed the triage vital signs and the nursing notes.  Pertinent labs & imaging results that were available during my care of the patient were reviewed by me and considered in my medical decision making (see chart for details).    Zofran for nausea/vomiting.  Other symptomatic treatment discussed.  Push fluids.  Rx of Augmentin sent to pharmacy, can fill in 5 days if symptoms do not improving for sinusitis.  Return precautions given.  Patient expresses understanding and agrees to plan.  Final Clinical Impressions(s) / UC Diagnoses   Final diagnoses:  Viral syndrome    ED Discharge Orders        Ordered    amoxicillin-clavulanate (AUGMENTIN) 875-125 MG tablet  Every 12 hours     04/23/17 1924    ipratropium (ATROVENT) 0.06 % nasal spray  4 times daily     04/23/17 1922    fluticasone (FLONASE) 50 MCG/ACT nasal spray  Daily     04/23/17 1922    ondansetron (ZOFRAN ODT) 4 MG disintegrating tablet  Every 8 hours PRN     04/23/17 1922        Ok Edwards, PA-C 04/23/17 1932

## 2019-07-07 ENCOUNTER — Other Ambulatory Visit (HOSPITAL_COMMUNITY): Payer: Self-pay | Admitting: Obstetrics and Gynecology

## 2019-07-07 DIAGNOSIS — Z118 Encounter for screening for other infectious and parasitic diseases: Secondary | ICD-10-CM | POA: Diagnosis not present

## 2019-07-07 DIAGNOSIS — Z113 Encounter for screening for infections with a predominantly sexual mode of transmission: Secondary | ICD-10-CM | POA: Diagnosis not present

## 2019-07-07 DIAGNOSIS — Z1159 Encounter for screening for other viral diseases: Secondary | ICD-10-CM | POA: Diagnosis not present

## 2019-07-07 DIAGNOSIS — B373 Candidiasis of vulva and vagina: Secondary | ICD-10-CM | POA: Diagnosis not present

## 2019-07-07 DIAGNOSIS — Z114 Encounter for screening for human immunodeficiency virus [HIV]: Secondary | ICD-10-CM | POA: Diagnosis not present

## 2019-07-07 DIAGNOSIS — N760 Acute vaginitis: Secondary | ICD-10-CM | POA: Diagnosis not present

## 2019-07-07 MED FILL — VALACYCLOVIR HCL 500 MG TAB: 500 | 5 days supply | Qty: 10 | Fill #0

## 2019-07-07 MED FILL — FLUCONAZOLE 150 MG TABS: 150 | 4 days supply | Qty: 2 | Fill #0

## 2019-07-07 MED FILL — TINIDAZOLE 500 MG TAB: 500 | 5 days supply | Qty: 10 | Fill #0

## 2019-07-12 MED FILL — FLUCONAZOLE 150 MG TABS: 150 | 2 days supply | Qty: 1 | Fill #1

## 2019-07-14 ENCOUNTER — Encounter (HOSPITAL_COMMUNITY): Payer: Self-pay

## 2019-07-14 ENCOUNTER — Ambulatory Visit (HOSPITAL_COMMUNITY)
Admission: EM | Admit: 2019-07-14 | Discharge: 2019-07-14 | Disposition: A | Discharge status: Home / Self Care | Payer: BC Managed Care – PPO | Attending: Internal Medicine | Admitting: Internal Medicine

## 2019-07-14 ENCOUNTER — Other Ambulatory Visit: Payer: Self-pay

## 2019-07-14 DIAGNOSIS — H6501 Acute serous otitis media, right ear: Secondary | ICD-10-CM | POA: Diagnosis not present

## 2019-07-14 MED ORDER — AMOXICILLIN-POT CLAVULANATE 875-125 MG PO TABS: 1.0000 | ORAL_TABLET | Freq: Two times a day (BID) | ORAL | 0 refills | Status: AC

## 2019-07-14 MED ORDER — CARBAMIDE PEROXIDE 6.5 % OT SOLN: 5.0000 [drp] | Freq: Two times a day (BID) | OTIC | 0 refills | Status: DC | PRN

## 2019-07-14 NOTE — ED Triage Notes (Signed)
Pt c/o ringing in right earx2 days. Pt denies pain in ear.

## 2019-07-14 NOTE — ED Provider Notes (Signed)
Stacy Fowler    CSN: [id number] Arrival date & time: 07/14/19  1830      History   Chief Complaint Chief Complaint  Patient presents with  . Otalgia    HPI Stacy Fowler is a 29 y.o. female comes to the urgent care with a 2-day history of right ear pain.  Symptoms started insidiously and has been worsening.  Pain is constant, throbbing and without any known relieving factors.  Patient complains of decreased hearing out of the right ear and also ringing in the right ear.  No fever or chills.  No history of otitis media in the past. HPI  Past Medical History:  Diagnosis Date  . Genital herpes   . Obesity   . Skin infection     Patient Active Problem List   Diagnosis Date Noted  . HYPERTENSION, BENIGN ESSENTIAL 11/26/2009  . ACUTE CYSTITIS 11/26/2009  . GENITAL HERPES 04/26/2009  . MORBID OBESITY 04/26/2009    Past Surgical History:  Procedure Laterality Date  . BREAST REDUCTION SURGERY      OB History   No obstetric history on file.      Home Medications    Prior to Admission medications   Medication Sig Start Date End Date Taking? Authorizing Provider  amoxicillin-clavulanate (AUGMENTIN) 875-125 MG tablet Take 1 tablet by mouth every 12 (twelve) hours for 10 days. Can fill on 4/16 if symptoms not improving 07/14/19 07/24/19  Brittnee Gaetano, Myrene Galas, MD  carbamide peroxide (DEBROX) 6.5 % OTIC solution Place 5 drops into both ears 2 (two) times daily as needed. 07/14/19   Chase Picket, MD  fluticasone (FLONASE) 50 MCG/ACT nasal spray Place 2 sprays into both nostrils daily. 04/23/17 07/14/19  Tasia Catchings, Amy V, PA-C  ipratropium (ATROVENT) 0.06 % nasal spray Place 2 sprays into both nostrils 4 (four) times daily. 04/23/17 07/14/19  Tasia Catchings, Amy V, PA-C  sodium chloride (OCEAN) 0.65 % SOLN nasal spray Place 1 spray into both nostrils 3 (three) times daily as needed for congestion.  07/14/19  [provider]    Family History Family History  Problem Relation Age of Onset    . Diabetes Other   . Hypertension Other     Social History Social History   Tobacco Use  . Smoking status: Never Smoker  . Smokeless tobacco: Never Used  Substance Use Topics  . Alcohol use: No  . Drug use: No     Allergies   Other   Review of Systems Review of Systems  HENT: Positive for ear pain and tinnitus. Negative for congestion, ear discharge and sore throat.   Eyes: Negative.   Respiratory: Negative.   Cardiovascular: Negative.   Gastrointestinal: Negative.      Physical Exam Triage Vital Signs ED Triage Vitals  Enc Vitals Group     BP 07/14/19 1845 (!) 142/96     Pulse Rate 07/14/19 1845 87     Resp 07/14/19 1845 16     Temp 07/14/19 1845 98.1 F (36.7 C)     Temp Source 07/14/19 1845 Oral     SpO2 --      Weight 07/14/19 1846 (!) 330 lb (149.7 kg)     Height 07/14/19 1846 5\' 7"  (1.702 m)     Head Circumference --      Peak Flow --      Pain Score 07/14/19 1845 0     Pain Loc --      Pain Edu? --  Excl. in [place]? --    No data found.  Updated Vital Signs BP (!) 142/96   Pulse 87   Temp 98.1 F (36.7 C) (Oral)   Resp 16   Ht 5\' 7"  (1.702 m)   Wt (!) 149.7 kg   BMI 51.69 kg/m   Visual Acuity Right Eye Distance:   Left Eye Distance:   Bilateral Distance:    Right Eye Near:   Left Eye Near:    Bilateral Near:     Physical Exam Vitals and nursing note reviewed.  Constitutional:      General: She is in acute distress.     Appearance: She is not ill-appearing.  HENT:     Right Ear: There is no impacted cerumen.     Left Ear: Tympanic membrane normal. There is no impacted cerumen.     Ears:     Comments: Bulging erythematous right tympanic membrane.  External ear canal is without erythema. Cardiovascular:     Rate and Rhythm: Normal rate and regular rhythm.     Pulses: Normal pulses.     Heart sounds: Normal heart sounds.  Pulmonary:     Effort: Pulmonary effort is normal.     Breath sounds: Normal breath sounds.   Musculoskeletal:        General: No swelling, tenderness or signs of injury. Normal range of motion.  Neurological:     Mental Status: She is alert.      UC Treatments / Results  Labs (all labs ordered are listed, but only abnormal results are displayed) Labs Reviewed - No data to display  EKG   Radiology No results found.  Procedures Procedures (including critical care time)  Medications Ordered in UC Medications - No data to display  Initial Impression / Assessment and Plan / UC Course  I have reviewed the triage vital signs and the nursing notes.  Pertinent labs & imaging results that were available during my care of the patient were reviewed by me and considered in my medical decision making (see chart for details).     1.  Right otitis media: Augmentin 1 tablet twice daily for 10 days Tylenol as needed for pain If patient symptoms worsens she can return to urgent care to be reevaluated Patient is counseled against cleaning her ears every other day Final Clinical Impressions(s) / UC Diagnoses   Final diagnoses:  Right acute serous otitis media, recurrence not specified   Discharge Instructions   None    ED Prescriptions    Medication Sig Dispense Auth. Provider   amoxicillin-clavulanate (AUGMENTIN) 875-125 MG tablet Take 1 tablet by mouth every 12 (twelve) hours for 10 days. Can fill on 4/16 if symptoms not improving 20 tablet Devora Tortorella, Myrene Galas, MD   carbamide peroxide (DEBROX) 6.5 % OTIC solution Place 5 drops into both ears 2 (two) times daily as needed. 15 mL Gautam Langhorst, Myrene Galas, MD     PDMP not reviewed this encounter.   Chase Picket, MD 07/14/19 [phone]

## 2019-08-17 MED FILL — VALACYCLOVIR HCL 500 MG TAB: 500 | 5 days supply | Qty: 10 | Fill #1

## 2019-08-27 ENCOUNTER — Ambulatory Visit: Payer: BC Managed Care – PPO | Attending: Internal Medicine

## 2019-08-27 DIAGNOSIS — Z23 Encounter for immunization: Secondary | ICD-10-CM

## 2019-08-27 NOTE — Progress Notes (Signed)
   Covid-19 Vaccination Clinic  Name:  Lillybeth Tal    MRN: [id number] DOB: 19-Mar-1990  08/27/2019  Ms. Sirmon was observed post Covid-19 immunization for 15 minutes without incident. She was provided with Vaccine Information Sheet and instruction to access the V-Safe system.   Ms. Rittenhouse was instructed to call 911 with any severe reactions post vaccine: Marland Kitchen Difficulty breathing  . Swelling of face and throat  . A fast heartbeat  . A bad rash all over body  . Dizziness and weakness   Immunizations Administered    Name Date Dose VIS Date Route   Pfizer COVID-19 Vaccine 08/27/2019 10:05 AM 0.3 mL 03/09/2018 Intramuscular   Manufacturer: [place]   Lot: [id number]   [place]: [id number]

## 2019-09-05 MED FILL — DOXYCYCLINE HYCLATE 100 MG: 100 | 10 days supply | Qty: 20 | Fill #0

## 2019-09-20 ENCOUNTER — Ambulatory Visit: Payer: BC Managed Care – PPO | Attending: Internal Medicine

## 2019-09-20 ENCOUNTER — Ambulatory Visit: Payer: BC Managed Care – PPO

## 2019-09-20 DIAGNOSIS — Z23 Encounter for immunization: Secondary | ICD-10-CM

## 2019-09-20 NOTE — Progress Notes (Signed)
   Covid-19 Vaccination Clinic  Name:  Stacy Fowler    MRN: [id number] DOB: Jan 11, 1991  09/20/2019  [name] was observed post Covid-19 immunization for 15 minutes without incident. She was provided with Vaccine Information Sheet and instruction to access the V-Safe system.   [name] was instructed to call 911 with any severe reactions post vaccine: Marland Kitchen Difficulty breathing  . Swelling of face and throat  . A fast heartbeat  . A bad rash all over body  . Dizziness and weakness   Immunizations Administered    Name Date Dose VIS Date Route   Pfizer COVID-19 Vaccine 09/20/2019  9:17 AM 0.3 mL 03/09/2018 Intramuscular   Manufacturer: [place]   Lot: [id number]   [place]: [id number]

## 2019-10-14 DIAGNOSIS — Z0001 Encounter for general adult medical examination with abnormal findings: Secondary | ICD-10-CM | POA: Diagnosis not present

## 2019-10-14 DIAGNOSIS — Z23 Encounter for immunization: Secondary | ICD-10-CM | POA: Diagnosis not present

## 2019-10-14 DIAGNOSIS — Z1159 Encounter for screening for other viral diseases: Secondary | ICD-10-CM | POA: Diagnosis not present

## 2019-10-14 DIAGNOSIS — R030 Elevated blood-pressure reading, without diagnosis of hypertension: Secondary | ICD-10-CM | POA: Diagnosis not present

## 2019-10-14 DIAGNOSIS — Z124 Encounter for screening for malignant neoplasm of cervix: Secondary | ICD-10-CM | POA: Diagnosis not present

## 2019-10-20 DIAGNOSIS — Z131 Encounter for screening for diabetes mellitus: Secondary | ICD-10-CM | POA: Diagnosis not present

## 2019-10-20 DIAGNOSIS — R030 Elevated blood-pressure reading, without diagnosis of hypertension: Secondary | ICD-10-CM | POA: Diagnosis not present

## 2019-10-20 DIAGNOSIS — Z0001 Encounter for general adult medical examination with abnormal findings: Secondary | ICD-10-CM | POA: Diagnosis not present

## 2019-10-20 DIAGNOSIS — Z1159 Encounter for screening for other viral diseases: Secondary | ICD-10-CM | POA: Diagnosis not present

## 2019-10-28 ENCOUNTER — Other Ambulatory Visit (HOSPITAL_COMMUNITY): Payer: Self-pay | Admitting: Internal Medicine

## 2019-10-29 MED FILL — CEPHALEXIN 500 MG CAPSULE: 500 | 5 days supply | Qty: 10 | Fill #0

## 2019-11-08 DIAGNOSIS — R7303 Prediabetes: Secondary | ICD-10-CM | POA: Diagnosis not present

## 2019-11-08 DIAGNOSIS — I10 Essential (primary) hypertension: Secondary | ICD-10-CM | POA: Diagnosis not present

## 2019-11-08 MED FILL — AMLODIPINE BESYLATE 5 MG TA: 5 | 30 days supply | Qty: 30 | Fill #0

## 2019-12-16 MED FILL — VALACYCLOVIR HCL 500 MG TAB: 500 | 5 days supply | Qty: 10 | Fill #2

## 2020-01-14 NOTE — L&D Delivery Note (Signed)
LABOR COURSE Patient was admitted for IOL as indicated by Chronic Hypertension, managed with Labetalol 200mg  BID. She received 1 dose of Cytotec and had a foley balloon placed for cervical ripening. She was then augmented with Pitocin and SROM'd.  Delivery Note Called to room and patient was complete and pushing. Fetal head +1 to +2 at initiation of pushing. Excellent maternal effort throughout. Head delivered ROT. No nuchal cord present. Shoulder and body delivered in usual fashion. At 1308 a viable and healthy female was delivered via Vaginal, Spontaneous (Presentation: ROT; ROA).  Infant with spontaneous cry, placed on mother's abdomen, dried and stimulated. Cord clamped x 2 after two-minute delay, and cut by FOB. Cord blood drawn. Placenta delivered spontaneously with gentle cord traction. Appears intact. Fundus firm with massage and Pitocin. Labia, perineum, vagina, and cervix inspected with left periuretheral laceration.    APGAR: 8,9 ; weight: 3334g  .   Cord: 3VC with the following complications:None.    Anesthesia:  Epidural plus Lidocaine for repair Episiotomy: None Lacerations:  Suture Repair:  4.0 Monocryl on SH Q Blood Loss (mL): 375  Laceration repair completed by Dr. Roselie Awkward.   Mom to postpartum.  Baby to Couplet care / Skin to Skin.  Mallie Snooks, CNM 11/30/20 1:42 PM

## 2020-03-25 ENCOUNTER — Encounter (HOSPITAL_COMMUNITY): Payer: Self-pay | Admitting: Emergency Medicine

## 2020-03-25 ENCOUNTER — Other Ambulatory Visit: Payer: Self-pay

## 2020-03-25 ENCOUNTER — Ambulatory Visit (HOSPITAL_COMMUNITY)
Admission: EM | Admit: 2020-03-25 | Discharge: 2020-03-25 | Disposition: A | Discharge status: Home / Self Care | Payer: BC Managed Care – PPO | Attending: Student | Admitting: Student

## 2020-03-25 DIAGNOSIS — M545 Low back pain, unspecified: Secondary | ICD-10-CM

## 2020-03-25 DIAGNOSIS — Z3201 Encounter for pregnancy test, result positive: Secondary | ICD-10-CM

## 2020-03-25 DIAGNOSIS — I10 Essential (primary) hypertension: Secondary | ICD-10-CM

## 2020-03-25 DIAGNOSIS — Z87440 Personal history of urinary (tract) infections: Secondary | ICD-10-CM

## 2020-03-25 DIAGNOSIS — S39012A Strain of muscle, fascia and tendon of lower back, initial encounter: Secondary | ICD-10-CM

## 2020-03-25 LAB — POCT URINALYSIS DIPSTICK, ED / UC
Bilirubin Urine: NEGATIVE
Glucose, UA: NEGATIVE mg/dL
Ketones, ur: NEGATIVE mg/dL
Nitrite: NEGATIVE
Protein, ur: NEGATIVE mg/dL
Specific Gravity, Urine: 1.025 (ref 1.005–1.030)
Urobilinogen, UA: 0.2 mg/dL (ref 0.0–1.0)
pH: 7 (ref 5.0–8.0)

## 2020-03-25 LAB — HCG, QUANTITATIVE, PREGNANCY: hCG, Beta Chain, Quant, S: 27 m[IU]/mL — ABNORMAL HIGH (ref <5)

## 2020-03-25 LAB — POC URINE PREG, ED: Preg Test, Ur: POSITIVE — AB

## 2020-03-25 NOTE — ED Provider Notes (Signed)
[place]    CSN: [id number] Arrival date & time: 03/25/20  1117      History   Chief Complaint Chief Complaint  Patient presents with  . Back Pain    lower    HPI  Stacy Fowler is a 30 y.o. female presenting with right-sided lower back pain x1 day. History genital herpes, morbid obesity, skin infection.  States the right side of her back started hurting yesterday, and she had a massage this morning, and now her left and right lower back hurts.  Denies recent trauma, new exercises, radiation of pain down legs, weakness or sensation changes in arms or legs, change in bowel or bladder function.  Distant history of cystitis but denies urinary symptoms today. Denies hematuria, dysuria, frequency, urgency,  n/v/d/abd pain, fevers/chills, abdnormal vaginal discharge. LMP 03/05/2020. Sexually active. Condoms but no birth control use.  HPI  Past Medical History:  Diagnosis Date  . Genital herpes   . Obesity   . Skin infection     Patient Active Problem List   Diagnosis Date Noted  . HYPERTENSION, BENIGN ESSENTIAL 11/26/2009  . ACUTE CYSTITIS 11/26/2009  . GENITAL HERPES 04/26/2009  . MORBID OBESITY 04/26/2009    Past Surgical History:  Procedure Laterality Date  . BREAST REDUCTION SURGERY      OB History   No obstetric history on file.      Home Medications    Prior to Admission medications   Medication Sig Start Date End Date Taking? Authorizing Provider  fluticasone (FLONASE) 50 MCG/ACT nasal spray Place 2 sprays into both nostrils daily. 04/23/17 07/14/19  Tasia Catchings, Amy V, PA-C  ipratropium (ATROVENT) 0.06 % nasal spray Place 2 sprays into both nostrils 4 (four) times daily. 04/23/17 07/14/19  Tasia Catchings, Amy V, PA-C  sodium chloride (OCEAN) 0.65 % SOLN nasal spray Place 1 spray into both nostrils 3 (three) times daily as needed for congestion.  07/14/19  [provider]    Family History Family History  Problem Relation Age of Onset  . Diabetes Other   .  Hypertension Other     Social History Social History   Tobacco Use  . Smoking status: Never Smoker  . Smokeless tobacco: Never Used  Substance Use Topics  . Alcohol use: No  . Drug use: No     Allergies   Other   Review of Systems Review of Systems  Constitutional: Negative for chills, fever and unexpected weight change.  Respiratory: Negative for chest tightness and shortness of breath.   Cardiovascular: Negative for chest pain and palpitations.  Gastrointestinal: Negative for abdominal pain, diarrhea, nausea and vomiting.  Genitourinary: Negative for decreased urine volume, difficulty urinating, dysuria, enuresis, flank pain, frequency, genital sores, hematuria, menstrual problem, pelvic pain, urgency, vaginal bleeding, vaginal discharge and vaginal pain.  Musculoskeletal: Positive for back pain. Negative for arthralgias, gait problem, joint swelling, myalgias, neck pain and neck stiffness.  Skin: Negative for wound.  Neurological: Negative for dizziness, tremors, seizures, syncope, facial asymmetry, speech difficulty, weakness, light-headedness, numbness and headaches.  All other systems reviewed and are negative.    Physical Exam Triage Vital Signs ED Triage Vitals  Enc Vitals Group     BP 03/25/20 1140 (!) 146/90     Pulse Rate 03/25/20 1140 93     Resp 03/25/20 1140 20     Temp 03/25/20 1140 97.9 F (36.6 C)     Temp Source 03/25/20 1140 Oral     SpO2 03/25/20 1140 93 %  Weight --      Height --      Head Circumference --      Peak Flow --      Pain Score 03/25/20 1137 10     Pain Loc --      Pain Edu? --      Excl. in [place]? --    No data found.  Updated Vital Signs BP (!) 146/90 (BP Location: Left Arm)   Pulse 93   Temp 97.9 F (36.6 C) (Oral)   Resp 20   LMP 03/05/2020   SpO2 93% Comment: 93  Visual Acuity Right Eye Distance:   Left Eye Distance:   Bilateral Distance:    Right Eye Near:   Left Eye Near:    Bilateral Near:     Physical  Exam Vitals reviewed.  Constitutional:      General: She is not in acute distress.    Appearance: Normal appearance. She is not ill-appearing.  HENT:     Head: Normocephalic and atraumatic.  Cardiovascular:     Rate and Rhythm: Normal rate and regular rhythm.     Heart sounds: Normal heart sounds.  Pulmonary:     Effort: Pulmonary effort is normal.     Breath sounds: Normal breath sounds and air entry.  Abdominal:     Palpations: Abdomen is soft.     Tenderness: There is no abdominal tenderness. There is no right CVA tenderness, left CVA tenderness, guarding or rebound.     Comments: No bowel or bladder incontinence.  Musculoskeletal:     Cervical back: Normal range of motion. No swelling, deformity, signs of trauma, rigidity, spasms, tenderness, bony tenderness or crepitus. No pain with movement.     Thoracic back: No swelling, deformity, signs of trauma, spasms, tenderness or bony tenderness. Normal range of motion. No scoliosis.     Lumbar back: Spasms and tenderness present. No swelling, deformity, signs of trauma or bony tenderness. Normal range of motion. Negative right straight leg raise test and negative left straight leg raise test. No scoliosis.     Comments: Bilateral lumbar paraspinous muscle tenderness to palpation, R>L. No spinous deformity or stepoff. Pain elicited with flexion lumbar spine.  Strength 5/5 in UEs and LEs, sensation intact. Negative straight leg raise bilaterally. No CVAT.  Neurological:     General: No focal deficit present.     Mental Status: She is alert and oriented to person, place, and time.     Cranial Nerves: No cranial nerve deficit.     Comments: Strength 5/5 in UEs and LEs. Gait normal. Sensation intact in UEs and LEs.   Psychiatric:        Mood and Affect: Mood normal.        Behavior: Behavior normal.        Thought Content: Thought content normal.        Judgment: Judgment normal.      UC Treatments / Results  Labs (all labs ordered  are listed, but only abnormal results are displayed) Labs Reviewed  POCT URINALYSIS DIPSTICK, ED / UC - Abnormal; Notable for the following components:      Result Value   Hgb urine dipstick SMALL (*)    Leukocytes,Ua SMALL (*)    All other components within normal limits  POC URINE PREG, ED - Abnormal; Notable for the following components:   Preg Test, Ur POSITIVE (*)    All other components within normal limits  HCG, QUANTITATIVE, PREGNANCY  EKG   Radiology No results found.  Procedures Procedures (including critical care time)  Medications Ordered in UC Medications - No data to display  Initial Impression / Assessment and Plan / UC Course  I have reviewed the triage vital signs and the nursing notes.  Pertinent labs & imaging results that were available during my care of the patient were reviewed by me and considered in my medical decision making (see chart for details).     This patient is a 30 year old female presenting with lumbar strain.  Distant history of cystitis. UA today with small blood and small leuk. Given lack of urinary symptoms will refrain form sending culture.   Urine pregnancy test is very faintly positive. Plan to confirm with quant Hcg.   Will refrain from sending zanaflex until negative quant Hcg. Tylenol until negative result.  If quantitative hCG is negative, would send Zanaflex 2 mg up to 3 times daily (21 pills).  Hypertension currently fairly well controlled on no medications.  Return precautions discussed.   This chart was dictated using voice recognition software, Dragon. Despite the best efforts of this provider to proofread and correct errors, errors may still occur which can change documentation meaning.   Final Clinical Impressions(s) / UC Diagnoses   Final diagnoses:  Essential hypertension  Strain of lumbar region, initial encounter  History of acute cystitis  Positive pregnancy test     Discharge Instructions     -For  now, use Tylenol 1000 mg up to 3 times daily for your back pain.  You can also do heating pad, warm bath, etc. -Avoid ibuprofen until we get a negative blood pregnancy test.  This should be later today or tomorrow. -If your pregnancy test comes back as negative, we can send a muscle relaxer at that time.  This would be Zanaflex 2mg  (tizanidine) 3 times daily as needed for muscle spasms.  This can make you drowsy, so do avoid driving or operating machinery while you take this. -If you develop abdominal pain, vaginal spotting, vaginal bleeding, worsening of back pain-head to ED for further evaluation and management.    ED Prescriptions    None     PDMP not reviewed this encounter.   Hazel Sams, PA-C 03/25/20 1245

## 2020-03-25 NOTE — Discharge Instructions (Addendum)
-  For now, use Tylenol 1000 mg up to 3 times daily for your back pain.  You can also do heating pad, warm bath, etc. -Avoid ibuprofen until we get a negative blood pregnancy test.  This should be later today or tomorrow. -If your pregnancy test comes back as negative, we can send a muscle relaxer at that time.  This would be Zanaflex 2mg  (tizanidine) 3 times daily as needed for muscle spasms.  This can make you drowsy, so do avoid driving or operating machinery while you take this. -If you develop abdominal pain, vaginal spotting, vaginal bleeding, worsening of back pain-head to ED for further evaluation and management.

## 2020-03-25 NOTE — ED Notes (Signed)
Transported to bathroom for urine sample

## 2020-03-25 NOTE — ED Triage Notes (Signed)
Pt presents today with rt ower back that started today when she woke up but is now across the back. Denies injury. +ambulatory

## 2020-03-30 DIAGNOSIS — Z3201 Encounter for pregnancy test, result positive: Secondary | ICD-10-CM | POA: Diagnosis not present

## 2020-04-02 DIAGNOSIS — Z8759 Personal history of other complications of pregnancy, childbirth and the puerperium: Secondary | ICD-10-CM | POA: Diagnosis not present

## 2020-04-04 ENCOUNTER — Other Ambulatory Visit (HOSPITAL_COMMUNITY): Payer: Self-pay | Admitting: Obstetrics and Gynecology

## 2020-04-04 DIAGNOSIS — R030 Elevated blood-pressure reading, without diagnosis of hypertension: Secondary | ICD-10-CM | POA: Diagnosis not present

## 2020-04-04 DIAGNOSIS — Z8759 Personal history of other complications of pregnancy, childbirth and the puerperium: Secondary | ICD-10-CM | POA: Diagnosis not present

## 2020-04-04 MED FILL — LABETALOL HCL 100MG TABLET: 100 | 30 days supply | Qty: 60 | Fill #0

## 2020-04-17 ENCOUNTER — Other Ambulatory Visit (HOSPITAL_COMMUNITY): Payer: Self-pay

## 2020-04-17 DIAGNOSIS — O169 Unspecified maternal hypertension, unspecified trimester: Secondary | ICD-10-CM | POA: Diagnosis not present

## 2020-04-17 DIAGNOSIS — Z3A08 8 weeks gestation of pregnancy: Secondary | ICD-10-CM | POA: Diagnosis not present

## 2020-04-17 MED ORDER — LABETALOL HCL 200 MG PO TABS
ORAL_TABLET | ORAL | 1 refills | Status: DC
  Filled 2020-04-17: qty 60, 30d supply, fill #0

## 2020-04-23 ENCOUNTER — Other Ambulatory Visit (HOSPITAL_COMMUNITY): Payer: Self-pay

## 2020-04-23 ENCOUNTER — Other Ambulatory Visit: Payer: Self-pay

## 2020-04-23 ENCOUNTER — Inpatient Hospital Stay (HOSPITAL_COMMUNITY)
Admission: AD | Admit: 2020-04-23 | Discharge: 2020-04-23 | Disposition: A | Discharge status: Home / Self Care | Payer: BC Managed Care – PPO | Attending: Obstetrics and Gynecology | Admitting: Obstetrics and Gynecology

## 2020-04-23 ENCOUNTER — Encounter (HOSPITAL_COMMUNITY): Payer: Self-pay | Admitting: Family Medicine

## 2020-04-23 DIAGNOSIS — O99211 Obesity complicating pregnancy, first trimester: Secondary | ICD-10-CM | POA: Insufficient documentation

## 2020-04-23 DIAGNOSIS — Z3A01 Less than 8 weeks gestation of pregnancy: Secondary | ICD-10-CM | POA: Diagnosis not present

## 2020-04-23 DIAGNOSIS — A749 Chlamydial infection, unspecified: Secondary | ICD-10-CM | POA: Insufficient documentation

## 2020-04-23 DIAGNOSIS — O219 Vomiting of pregnancy, unspecified: Secondary | ICD-10-CM | POA: Diagnosis not present

## 2020-04-23 MED ORDER — SCOPOLAMINE 1 MG/3DAYS TD PT72: 1.0000 | MEDICATED_PATCH | TRANSDERMAL | 0 refills | Status: DC

## 2020-04-23 MED ORDER — FAMOTIDINE 20 MG PO TABS: 20.0000 mg | ORAL_TABLET | Freq: Two times a day (BID) | ORAL | 0 refills | Status: DC

## 2020-04-23 MED ORDER — FAMOTIDINE IN NACL 20-0.9 MG/50ML-% IV SOLN
20.0000 mg | Freq: Once | INTRAVENOUS | Status: AC
  Administered 2020-04-23: 20 mg via INTRAVENOUS
  Filled 2020-04-23: qty 50

## 2020-04-23 MED ORDER — SCOPOLAMINE 1 MG/3DAYS TD PT72
1.0000 | MEDICATED_PATCH | Freq: Once | TRANSDERMAL | Status: DC
  Administered 2020-04-23: 1.5 mg via TRANSDERMAL
  Filled 2020-04-23: qty 1

## 2020-04-23 MED ORDER — METOCLOPRAMIDE HCL 10 MG PO TABS: 10.0000 mg | ORAL_TABLET | Freq: Three times a day (TID) | ORAL | 0 refills | Status: DC | PRN

## 2020-04-23 MED ORDER — LACTATED RINGERS IV BOLUS
1000.0000 mL | Freq: Once | INTRAVENOUS | Status: AC
  Administered 2020-04-23: 1000 mL via INTRAVENOUS

## 2020-04-23 MED ORDER — METOCLOPRAMIDE HCL 5 MG/ML IJ SOLN
10.0000 mg | Freq: Once | INTRAMUSCULAR | Status: AC
  Administered 2020-04-23: 10 mg via INTRAVENOUS
  Filled 2020-04-23: qty 2

## 2020-04-23 NOTE — Discharge Instructions (Signed)
Morning Sickness  Morning sickness is when a woman feels nauseous during pregnancy. This nauseous feeling may or may not come with vomiting. It often occurs in the morning, but it can be a problem at any time of day. Morning sickness is most common during the first trimester. In some cases, it may continue throughout pregnancy. Although morning sickness is unpleasant, it is usually harmless unless the woman develops severe and continual vomiting (hyperemesis gravidarum), a condition that requires more intense treatment. What are the causes? The exact cause of this condition is not known, but it seems to be related to normal hormonal changes that occur in pregnancy. What increases the risk? You are more likely to develop this condition if:  You experienced nausea or vomiting before your pregnancy.  You had morning sickness during a previous pregnancy.  You are pregnant with more than one baby, such as twins. What are the signs or symptoms? Symptoms of this condition include:  Nausea.  Vomiting. How is this diagnosed? This condition is usually diagnosed based on your signs and symptoms. How is this treated? In many cases, treatment is not needed for this condition. Making some changes to what you eat may help to control symptoms. Your health care provider may also prescribe or recommend:  Vitamin B6 supplements.  Anti-nausea medicines.  Ginger. Follow these instructions at home: Medicines  Take over-the-counter and prescription medicines only as told by your health care provider. Do not use any prescription, over-the-counter, or herbal medicines for morning sickness without first talking with your health care provider.  Take multivitamins before getting pregnant. This can prevent or decrease the severity of morning sickness in most women. Eating and drinking  Eat a piece of dry toast or crackers before getting out of bed in the morning.  Eat 5 or 6 small meals a day.  Eat dry  and bland foods, such as rice or a baked potato. Foods that are high in carbohydrates are often helpful.  Avoid greasy, fatty, and spicy foods.  Have someone cook for you if the smell of any food causes nausea and vomiting.  If you feel nauseous after taking prenatal vitamins, take the vitamins at night or with a snack.  Eat a protein snack between meals if you are hungry. Nuts, yogurt, and cheese are good options.  Drink fluids throughout the day.  Try ginger ale made with real ginger, ginger tea made from fresh grated ginger, or ginger candies. General instructions  Do not use any products that contain nicotine or tobacco. These products include cigarettes, chewing tobacco, and vaping devices, such as e-cigarettes. If you need help quitting, ask your health care provider.  Get an air purifier to keep the air in your house free of odors.  Get plenty of fresh air.  Try to avoid odors that trigger your nausea.  Consider trying these methods to help relieve symptoms: ? Wearing an acupressure wristband. These wristbands are often worn for seasickness. ? Acupuncture. Contact a health care provider if:  Your home remedies are not working and you need medicine.  You feel dizzy or light-headed.  You are losing weight. Get help right away if:  You have persistent and uncontrolled nausea and vomiting.  You faint.  You have severe pain in your abdomen. Summary  Morning sickness is when a woman feels nauseous during pregnancy. This nauseous feeling may or may not come with vomiting.  Morning sickness is most common during the first trimester.  It often occurs in the  morning, but it can be a problem at any time of day.  In many cases, treatment is not needed for this condition. Making some changes to what you eat may help to control symptoms. This information is not intended to replace advice given to you by your health care provider. Make sure you discuss any questions you have  with your health care provider. Document Revised: 08/15/2019 Document Reviewed: 07/25/2019 Elsevier Patient Education  2021 [employer].

## 2020-04-23 NOTE — MAU Note (Signed)
Since she got off work. Friday, she has pretty much been in the bed.  Feels so sick, can't keep anything down.  Really hasn't eaten in 3 days.  Believes she is dehydrated.  No meds for nausea. Called OB/GYN, they suggested she come here.  Is on BP medication, has not taken since Friday. Used restroom after registration, did not get sample

## 2020-04-23 NOTE — MAU Provider Note (Signed)
History     CSN: [id number]  Arrival date and time: 04/23/20 1018   Event Date/Time   First Provider Initiated Contact with Patient 04/23/20 1050      Chief Complaint  Patient presents with  . Emesis   HPI Stacy Fowler is a 30 y.o. G1P0 at [redacted]w[redacted]d who presents with nausea & vomiting. Symptoms started on Friday. Reports vomiting 6-10 times per day. Has vomited 6 times this morning. Is unable to keep down any food or fluids. Has tried applesauce and beef broth but couldn't keep it down. Has not treated her symptoms. Does not have an antiemetic at home. Called her ob [employer] [facility]) and was instructed to come to MAU. Has appointment with them on Wednesday.  Denies abdominal pain, fever, diarrhea, or vaginal bleeding.   OB History    Gravida  1   Para      Term      Preterm      AB      Living        SAB      IAB      Ectopic      Multiple      Live Births              Past Medical History:  Diagnosis Date  . Genital herpes   . Obesity   . Skin infection     Past Surgical History:  Procedure Laterality Date  . BREAST REDUCTION SURGERY      Family History  Problem Relation Age of Onset  . Diabetes Other   . Hypertension Other     Social History   Tobacco Use  . Smoking status: Never Smoker  . Smokeless tobacco: Never Used  Substance Use Topics  . Alcohol use: No  . Drug use: No    Allergies:  Allergies  Allergen Reactions  . Other     Cantaloupe: itching    No medications prior to admission.    Review of Systems  Constitutional: Negative.   Gastrointestinal: Positive for nausea and vomiting. Negative for abdominal pain and diarrhea.  Genitourinary: Negative.    Physical Exam   Blood pressure 139/81, pulse 83, temperature 98.4 F (36.9 C), temperature source Oral, resp. rate 15, height 5\' 7"  (1.702 m), weight (!) 140.4 kg, last menstrual period 03/05/2020, SpO2 100 %.  Physical Exam Vitals and nursing note reviewed.   Constitutional:      General: She is not in acute distress.    Appearance: Normal appearance.  HENT:     Head: Normocephalic and atraumatic.     Mouth/Throat:     Mouth: Mucous membranes are dry.  Pulmonary:     Effort: Pulmonary effort is normal. No respiratory distress.  Skin:    General: Skin is warm and dry.  Neurological:     Mental Status: She is alert.  Psychiatric:        Mood and Affect: Mood normal.        Behavior: Behavior normal.     MAU Course  Procedures No results found for this or any previous visit (from the past 24 hour(s)).  MDM Patient presents with n/v; no abdominal pain or vaginal bleeding.  IV fluids given with reglan & pepcid. Pt vomited once after meds. Does not have ride home so defer giving other antiemetics that cause CNS depression. Scop patch applied prior to discharge.    Assessment and Plan   1. Nausea and vomiting during pregnancy prior to [redacted] weeks  gestation   2. [redacted] weeks gestation of pregnancy    -Rx pepcid & reglan -keep appt with [facility] later this week -reviewed reasons to return to [place] 04/23/2020, 4:17 PM

## 2020-04-25 ENCOUNTER — Inpatient Hospital Stay (HOSPITAL_COMMUNITY)
Admission: AD | Admit: 2020-04-25 | Discharge: 2020-04-25 | Disposition: A | Discharge status: Home / Self Care | Payer: BC Managed Care – PPO | Attending: Obstetrics | Admitting: Obstetrics

## 2020-04-25 ENCOUNTER — Other Ambulatory Visit: Payer: Self-pay

## 2020-04-25 ENCOUNTER — Telehealth: Payer: Self-pay | Admitting: Advanced Practice Midwife

## 2020-04-25 ENCOUNTER — Encounter (HOSPITAL_COMMUNITY): Payer: Self-pay | Admitting: Obstetrics

## 2020-04-25 DIAGNOSIS — O23591 Infection of other part of genital tract in pregnancy, first trimester: Secondary | ICD-10-CM

## 2020-04-25 DIAGNOSIS — A5901 Trichomonal vulvovaginitis: Secondary | ICD-10-CM

## 2020-04-25 DIAGNOSIS — O219 Vomiting of pregnancy, unspecified: Secondary | ICD-10-CM

## 2020-04-25 DIAGNOSIS — Z79899 Other long term (current) drug therapy: Secondary | ICD-10-CM | POA: Diagnosis not present

## 2020-04-25 DIAGNOSIS — Z3A01 Less than 8 weeks gestation of pregnancy: Secondary | ICD-10-CM | POA: Insufficient documentation

## 2020-04-25 DIAGNOSIS — O210 Mild hyperemesis gravidarum: Secondary | ICD-10-CM | POA: Insufficient documentation

## 2020-04-25 HISTORY — DX: Unspecified infectious disease: B99.9

## 2020-04-25 LAB — URINALYSIS, ROUTINE W REFLEX MICROSCOPIC
Bilirubin Urine: NEGATIVE
Glucose, UA: NEGATIVE mg/dL
Ketones, ur: 80 mg/dL — AB
Nitrite: NEGATIVE
Protein, ur: 30 mg/dL — AB
Specific Gravity, Urine: 1.025 (ref 1.005–1.030)
Squamous Epithelial / HPF: >50 — ABNORMAL HIGH (ref 0–5)
pH: 6 (ref 5.0–8.0)

## 2020-04-25 LAB — COMPREHENSIVE METABOLIC PANEL
ALT: 28 U/L (ref 0–44)
AST: 24 U/L (ref 15–41)
Albumin: 3.9 g/dL (ref 3.5–5.0)
Alkaline Phosphatase: 60 U/L (ref 38–126)
Anion gap: 13 (ref 5–15)
BUN: 8 mg/dL (ref 6–20)
CO2: 20 mmol/L — ABNORMAL LOW (ref 22–32)
Calcium: 9.6 mg/dL (ref 8.9–10.3)
Chloride: 103 mmol/L (ref 98–111)
Creatinine, Ser: 0.78 mg/dL (ref 0.44–1.00)
GFR, Estimated: >60 mL/min (ref >60)
Glucose, Bld: 79 mg/dL (ref 70–99)
Potassium: 3.6 mmol/L (ref 3.5–5.1)
Sodium: 136 mmol/L (ref 135–145)
Total Bilirubin: 1.3 mg/dL — ABNORMAL HIGH (ref 0.3–1.2)
Total Protein: 7.6 g/dL (ref 6.5–8.1)

## 2020-04-25 LAB — CBC
HCT: 39.8 % (ref 36.0–46.0)
Hemoglobin: 13 g/dL (ref 12.0–15.0)
MCH: 27.7 pg (ref 26.0–34.0)
MCHC: 32.7 g/dL (ref 30.0–36.0)
MCV: 84.9 fL (ref 80.0–100.0)
Platelets: 287 10*3/uL (ref 150–400)
RBC: 4.69 MIL/uL (ref 3.87–5.11)
RDW: 13.2 % (ref 11.5–15.5)
WBC: 12 10*3/uL — ABNORMAL HIGH (ref 4.0–10.5)
nRBC: 0 % (ref 0.0–0.2)

## 2020-04-25 MED ORDER — FAMOTIDINE IN NACL 20-0.9 MG/50ML-% IV SOLN
20.0000 mg | Freq: Once | INTRAVENOUS | Status: AC
  Administered 2020-04-25: 20 mg via INTRAVENOUS
  Filled 2020-04-25: qty 50

## 2020-04-25 MED ORDER — METOCLOPRAMIDE HCL 10 MG PO TABS
10.0000 mg | ORAL_TABLET | Freq: Three times a day (TID) | ORAL | 0 refills | Status: DC | PRN
  Filled 2020-04-25: qty 30, 10d supply, fill #0

## 2020-04-25 MED ORDER — PROMETHAZINE HCL 25 MG PO TABS
12.5000 mg | ORAL_TABLET | Freq: Four times a day (QID) | ORAL | 2 refills | Status: DC | PRN
  Filled 2020-04-25: qty 30, 8d supply, fill #0

## 2020-04-25 MED ORDER — PROMETHAZINE HCL 25 MG PO TABS: 12.5000 mg | ORAL_TABLET | Freq: Four times a day (QID) | ORAL | 0 refills | Status: DC | PRN

## 2020-04-25 MED ORDER — SCOPOLAMINE 1 MG/3DAYS TD PT72
1.0000 | MEDICATED_PATCH | TRANSDERMAL | Status: DC
  Administered 2020-04-25: 1.5 mg via TRANSDERMAL
  Filled 2020-04-25: qty 1

## 2020-04-25 MED ORDER — SODIUM CHLORIDE 0.9 % IV SOLN
12.5000 mg | Freq: Once | INTRAVENOUS | Status: AC
  Administered 2020-04-25: 12.5 mg via INTRAVENOUS
  Filled 2020-04-25: qty 0.5

## 2020-04-25 NOTE — Telephone Encounter (Signed)
Pt was seen and evaluated in MAU today, 04/25/20, for n/v of pregnancy. She was given IV fluids and medications and discharged. She did not have vaginal symptoms. See full MAU note.    UA resulted with trichomonas present, which was reviewed after pt discharge from MAU. Called pt and went to voicemail. Left message requesting return call about lab results.

## 2020-04-25 NOTE — MAU Provider Note (Signed)
Chief Complaint: Emesis and Nausea   None     SUBJECTIVE HPI: Stacy Fowler is a 30 y.o. G1P0 at 103w2d who presents to maternity admissions reporting continuing nausea and vomiting making her unable to keep down any food or fluids today. She was prescribed medications from MAU on 04/23/20 but is unable to pick up medications before she gets paid in 2 days due to cost.  She has insurance but her medications still cost more than $25 dollars and she is unable to pay this.  She denies any abdominal pain, vaginal bleeding, vaginal discharge/itching/burning or other symptoms.   HPI  Past Medical History:  Diagnosis Date  . Genital herpes   . Infection    UTI  . Obesity   . Skin infection    Past Surgical History:  Procedure Laterality Date  . BREAST REDUCTION SURGERY    . BREAST SURGERY     Social History   Socioeconomic History  . Marital status: Single    Spouse name: Not on file  . Number of children: Not on file  . Years of education: Not on file  . Highest education level: Not on file  Occupational History  . Not on file  Tobacco Use  . Smoking status: Never Smoker  . Smokeless tobacco: Never Used  Vaping Use  . Vaping Use: Never used  Substance and Sexual Activity  . Alcohol use: No  . Drug use: No  . Sexual activity: Not Currently    Birth control/protection: None  Other Topics Concern  . Not on file  Social History Narrative  . Not on file   Social Determinants of Health   Financial Resource Strain: Not on file  Food Insecurity: Not on file  Transportation Needs: Not on file  Physical Activity: Not on file  Stress: Not on file  Social Connections: Not on file  Intimate Partner Violence: Not on file   No current facility-administered medications on file prior to encounter.   Current Outpatient Medications on File Prior to Encounter  Medication Sig Dispense Refill  . labetalol (NORMODYNE) 200 MG tablet Take 1 tablet by mouth twice a day. 60 tablet 1  .  Prenatal Vit-Fe Fumarate-FA (PRENATAL VITAMINS PO) Prenatal Vitamin    . famotidine (PEPCID) 20 MG tablet Take 1 tablet (20 mg total) by mouth 2 (two) times daily. 60 tablet 0  . labetalol (NORMODYNE) 100 MG tablet TAKE 1 TABLET BY MOUTH 2 TIMES DAILY 60 tablet 0  . metoCLOPramide (REGLAN) 10 MG tablet Take 1 tablet (10 mg total) by mouth every 8 (eight) hours as needed for nausea. 30 tablet 0  . scopolamine (TRANSDERM-SCOP, 1.5 MG,) 1 MG/3DAYS Place 1 patch (1.5 mg total) onto the skin every 3 (three) days. 10 patch 0  . valACYclovir (VALTREX) 500 MG tablet TAKE 1 TABLET BY MOUTH TWO TIMES DAILY FOR 3 DAYS AS NEEDED 10 tablet 2  . [DISCONTINUED] fluticasone (FLONASE) 50 MCG/ACT nasal spray Place 2 sprays into both nostrils daily. 1 g 0  . [DISCONTINUED] ipratropium (ATROVENT) 0.06 % nasal spray Place 2 sprays into both nostrils 4 (four) times daily. 15 mL 0  . [DISCONTINUED] sodium chloride (OCEAN) 0.65 % SOLN nasal spray Place 1 spray into both nostrils 3 (three) times daily as needed for congestion.     Allergies  Allergen Reactions  . Other     Cantaloupe: itching    ROS:  Review of Systems  Constitutional: Negative for chills, fatigue and fever.  Respiratory: Negative for  shortness of breath.   Cardiovascular: Negative for chest pain.  Gastrointestinal: Positive for nausea and vomiting.  Genitourinary: Negative for difficulty urinating, dysuria, flank pain, pelvic pain, vaginal bleeding, vaginal discharge and vaginal pain.  Neurological: Negative for dizziness and headaches.  Psychiatric/Behavioral: Negative.      I have reviewed patient's Past Medical Hx, Surgical Hx, Family Hx, Social Hx, medications and allergies.   Physical Exam   Patient Vitals for the past 24 hrs:  BP Temp Temp src Pulse Resp SpO2 Height Weight  04/25/20 1802 125/73 -- -- 83 -- -- -- --  04/25/20 1557 (!) 107/58 -- -- 88 18 100 % -- --  04/25/20 1245 140/89 98.4 F (36.9 C) Oral 90 20 100 % -- --   04/25/20 1240 -- -- -- -- -- -- 5\' 7"  (1.702 m) (!) 139.9 kg   Constitutional: Well-developed, well-nourished female in no acute distress.  HEART: normal rate, heart sounds, regular rhythm RESP: normal effort, lung sounds clear and equal bilaterally GI: Abd soft, non-tender. Pos BS x 4 MS: Extremities nontender, no edema, normal ROM Neurologic: Alert and oriented x 4.  GU: Neg CVAT.    LAB RESULTS Results for orders placed or performed during the hospital encounter of 04/25/20 (from the past 24 hour(s))  Urinalysis, Routine w reflex microscopic Urine, Clean Catch     Status: Abnormal   Collection Time: 04/25/20  1:01 PM  Result Value Ref Range   Color, Urine AMBER (A) YELLOW   APPearance CLOUDY (A) CLEAR   Specific Gravity, Urine 1.025 1.005 - 1.030   pH 6.0 5.0 - 8.0   Glucose, UA NEGATIVE NEGATIVE mg/dL   Hgb urine dipstick MODERATE (A) NEGATIVE   Bilirubin Urine NEGATIVE NEGATIVE   Ketones, ur 80 (A) NEGATIVE mg/dL   Protein, ur 30 (A) NEGATIVE mg/dL   Nitrite NEGATIVE NEGATIVE   Leukocytes,Ua LARGE (A) NEGATIVE   RBC / HPF 11-20 0 - 5 RBC/hpf   WBC, UA 21-50 0 - 5 WBC/hpf   Bacteria, UA FEW (A) NONE SEEN   Squamous Epithelial / LPF >50 (H) 0 - 5   Mucus PRESENT    Trichomonas, UA PRESENT (A) NONE SEEN  CBC     Status: Abnormal   Collection Time: 04/25/20  1:59 PM  Result Value Ref Range   WBC 12.0 (H) 4.0 - 10.5 K/uL   RBC 4.69 3.87 - 5.11 MIL/uL   Hemoglobin 13.0 12.0 - 15.0 g/dL   HCT 39.8 36.0 - 46.0 %   MCV 84.9 80.0 - 100.0 fL   MCH 27.7 26.0 - 34.0 pg   MCHC 32.7 30.0 - 36.0 g/dL   RDW 13.2 11.5 - 15.5 %   Platelets 287 150 - 400 K/uL   nRBC 0.0 0.0 - 0.2 %  Comprehensive metabolic panel     Status: Abnormal   Collection Time: 04/25/20  1:59 PM  Result Value Ref Range   Sodium 136 135 - 145 mmol/L   Potassium 3.6 3.5 - 5.1 mmol/L   Chloride 103 98 - 111 mmol/L   CO2 20 (L) 22 - 32 mmol/L   Glucose, Bld 79 70 - 99 mg/dL   BUN 8 6 - 20 mg/dL    Creatinine, Ser 0.78 0.44 - 1.00 mg/dL   Calcium 9.6 8.9 - 10.3 mg/dL   Total Protein 7.6 6.5 - 8.1 g/dL   Albumin 3.9 3.5 - 5.0 g/dL   AST 24 15 - 41 U/L   ALT 28 0 - 44  U/L   Alkaline Phosphatase 60 38 - 126 U/L   Total Bilirubin 1.3 (H) 0.3 - 1.2 mg/dL   GFR, Estimated >60 >60 mL/min   Anion gap 13 5 - 15       IMAGING No results found.  MAU Management/MDM: Orders Placed This Encounter  Procedures  . Urinalysis, Routine w reflex microscopic Urine, Clean Catch  . CBC  . Comprehensive metabolic panel  . Discharge patient    Meds ordered this encounter  Medications  . promethazine (PHENERGAN) 12.5 mg in sodium chloride 0.9 % 1,000 mL infusion  . famotidine (PEPCID) IVPB 20 mg premix  . scopolamine (TRANSDERM-SCOP) 1 MG/3DAYS 1.5 mg  . promethazine (PHENERGAN) 25 MG tablet    Sig: Take 0.5-1 tablets (12.5-25 mg total) by mouth every 6 (six) hours as needed for nausea.    Dispense:  30 tablet    Refill:  0    Order Specific Question:   Supervising Provider    Answer:   Woodroe Mode [[id number]]    Pt was in waiting room due to busy MAU census and was triaged and offered scopolamine patch and Rx for phenergan to save costs. Pt desired to wait for MAU room for IV fluids but scopolamine patch placed at time of triage.  Pt then was given LR x 1000 ml plus phenergan 12.5 mg IV and Pepcid 20 mg IVPB with resolved n/v. She tolerated PO food/fluids after meds/fluids.  She was discharged with plan to pick up new Rx for Phenergan which is inexpensive and she can continue Scopolamine and/or Reglan as desired when she can afford to pick these up.  Pt to f/u at [facility] as scheduled.   ASSESSMENT 1. Nausea and vomiting during pregnancy prior to [redacted] weeks gestation   2. [redacted] weeks gestation of pregnancy     PLAN Discharge home Allergies as of 04/25/2020      Reactions   Other    Cantaloupe: itching      Medication List    TAKE these medications   famotidine 20 MG  tablet Commonly known as: PEPCID Take 1 tablet (20 mg total) by mouth 2 (two) times daily.   labetalol 100 MG tablet Commonly known as: NORMODYNE TAKE 1 TABLET BY MOUTH 2 TIMES DAILY   labetalol 200 MG tablet Commonly known as: NORMODYNE Take 1 tablet by mouth twice a day.   metoCLOPramide 10 MG tablet Commonly known as: REGLAN Take 1 tablet (10 mg total) by mouth every 8 (eight) hours as needed for nausea.   PRENATAL VITAMINS PO Prenatal Vitamin   promethazine 25 MG tablet Commonly known as: PHENERGAN Take 0.5-1 tablets (12.5-25 mg total) by mouth every 6 (six) hours as needed for nausea.   scopolamine 1 MG/3DAYS Commonly known as: Transderm-Scop (1.5 MG) Place 1 patch (1.5 mg total) onto the skin every 3 (three) days.   valACYclovir 500 MG tablet Commonly known as: VALTREX TAKE 1 TABLET BY MOUTH TWO TIMES DAILY FOR 3 DAYS AS NEEDED       Follow-up Information    [facility] Follow up.   Contact information: [place] [address] [place] Certified Nurse-Midwife 04/25/2020  6:22 PM

## 2020-04-25 NOTE — MAU Note (Signed)
Presents with c/o N/V, states can't keep anything down.  Reports now feeling faint.  States seen in MAU on Monday for same, Rx given.  Hasn't taken prescribed meds due to inability to afford prescription.

## 2020-04-26 ENCOUNTER — Other Ambulatory Visit: Payer: Self-pay | Admitting: Advanced Practice Midwife

## 2020-04-26 ENCOUNTER — Telehealth: Payer: Self-pay | Admitting: Student

## 2020-04-26 ENCOUNTER — Other Ambulatory Visit (HOSPITAL_COMMUNITY): Payer: Self-pay

## 2020-04-26 DIAGNOSIS — A5901 Trichomonal vulvovaginitis: Secondary | ICD-10-CM

## 2020-04-26 DIAGNOSIS — O209 Hemorrhage in early pregnancy, unspecified: Secondary | ICD-10-CM | POA: Diagnosis not present

## 2020-04-26 MED ORDER — METRONIDAZOLE 500 MG PO TABS
ORAL_TABLET | ORAL | 0 refills | Status: DC
  Filled 2020-04-26: qty 4, 1d supply, fill #0

## 2020-04-26 NOTE — Progress Notes (Signed)
Rx sent to pt preferred pharmacy for Flagyl 2,000 mg dose x 1.

## 2020-04-26 NOTE — Telephone Encounter (Signed)
Patient returned call from message left yesterday evening.   Patient called and verified her identity via birth date and last 64.  Patient agreeable to results via phone and was informed of positive trichomonas in urine. Prescription sent to pharmacy last night. [facility].    Jorje Guild, NP

## 2020-04-27 ENCOUNTER — Other Ambulatory Visit (HOSPITAL_COMMUNITY): Payer: Self-pay

## 2020-05-01 ENCOUNTER — Other Ambulatory Visit (HOSPITAL_COMMUNITY): Payer: Self-pay

## 2020-05-01 MED ORDER — FAMOTIDINE 40 MG PO TABS
40.0000 mg | ORAL_TABLET | Freq: Every day | ORAL | 0 refills | Status: DC
  Filled 2020-05-01: qty 30, 30d supply, fill #0

## 2020-05-01 MED ORDER — ONDANSETRON 4 MG PO TBDP
ORAL_TABLET | ORAL | 1 refills | Status: DC
  Filled 2020-05-01: qty 9, 3d supply, fill #0
  Filled 2020-06-18: qty 9, 3d supply, fill #1

## 2020-05-03 ENCOUNTER — Other Ambulatory Visit: Payer: Self-pay

## 2020-05-03 ENCOUNTER — Inpatient Hospital Stay (HOSPITAL_COMMUNITY)
Admission: AD | Admit: 2020-05-03 | Discharge: 2020-05-03 | Disposition: A | Discharge status: Home / Self Care | Payer: BC Managed Care – PPO | Attending: Obstetrics and Gynecology | Admitting: Obstetrics and Gynecology

## 2020-05-03 ENCOUNTER — Encounter (HOSPITAL_COMMUNITY): Payer: Self-pay | Admitting: Obstetrics and Gynecology

## 2020-05-03 DIAGNOSIS — A5901 Trichomonal vulvovaginitis: Secondary | ICD-10-CM

## 2020-05-03 DIAGNOSIS — O219 Vomiting of pregnancy, unspecified: Secondary | ICD-10-CM

## 2020-05-03 DIAGNOSIS — O210 Mild hyperemesis gravidarum: Secondary | ICD-10-CM

## 2020-05-03 DIAGNOSIS — Z3A08 8 weeks gestation of pregnancy: Secondary | ICD-10-CM

## 2020-05-03 DIAGNOSIS — K5900 Constipation, unspecified: Secondary | ICD-10-CM | POA: Diagnosis not present

## 2020-05-03 DIAGNOSIS — O98311 Other infections with a predominantly sexual mode of transmission complicating pregnancy, first trimester: Secondary | ICD-10-CM | POA: Diagnosis not present

## 2020-05-03 DIAGNOSIS — O99611 Diseases of the digestive system complicating pregnancy, first trimester: Secondary | ICD-10-CM | POA: Insufficient documentation

## 2020-05-03 DIAGNOSIS — O23591 Infection of other part of genital tract in pregnancy, first trimester: Secondary | ICD-10-CM

## 2020-05-03 HISTORY — DX: Essential (primary) hypertension: I10

## 2020-05-03 LAB — BASIC METABOLIC PANEL
Anion gap: 14 (ref 5–15)
BUN: 14 mg/dL (ref 6–20)
CO2: 21 mmol/L — ABNORMAL LOW (ref 22–32)
Calcium: 9.9 mg/dL (ref 8.9–10.3)
Chloride: 100 mmol/L (ref 98–111)
Creatinine, Ser: 0.88 mg/dL (ref 0.44–1.00)
GFR, Estimated: >60 mL/min (ref >60)
Glucose, Bld: 111 mg/dL — ABNORMAL HIGH (ref 70–99)
Potassium: 3.2 mmol/L — ABNORMAL LOW (ref 3.5–5.1)
Sodium: 135 mmol/L (ref 135–145)

## 2020-05-03 MED ORDER — METOCLOPRAMIDE HCL 5 MG/ML IJ SOLN
10.0000 mg | Freq: Once | INTRAMUSCULAR | Status: AC
  Administered 2020-05-03: 10 mg via INTRAVENOUS
  Filled 2020-05-03: qty 2

## 2020-05-03 MED ORDER — SCOPOLAMINE 1 MG/3DAYS TD PT72
1.0000 | MEDICATED_PATCH | TRANSDERMAL | 0 refills | Status: AC
  Filled 2020-05-03: qty 10, 30d supply, fill #0

## 2020-05-03 MED ORDER — SCOPOLAMINE 1 MG/3DAYS TD PT72
1.0000 | MEDICATED_PATCH | TRANSDERMAL | Status: DC
  Administered 2020-05-03: 1.5 mg via TRANSDERMAL
  Filled 2020-05-03: qty 1

## 2020-05-03 MED ORDER — FAMOTIDINE IN NACL 20-0.9 MG/50ML-% IV SOLN
20.0000 mg | Freq: Once | INTRAVENOUS | Status: AC
  Administered 2020-05-03: 20 mg via INTRAVENOUS
  Filled 2020-05-03: qty 50

## 2020-05-03 MED ORDER — LACTATED RINGERS IV BOLUS
1000.0000 mL | Freq: Once | INTRAVENOUS | Status: AC
  Administered 2020-05-03: 1000 mL via INTRAVENOUS

## 2020-05-03 MED ORDER — LACTATED RINGERS IV SOLN: INTRAVENOUS | Status: DC

## 2020-05-03 MED ORDER — METOCLOPRAMIDE HCL 10 MG PO TABS
10.0000 mg | ORAL_TABLET | Freq: Three times a day (TID) | ORAL | 0 refills | Status: DC | PRN
  Filled 2020-05-03: qty 30, 10d supply, fill #0

## 2020-05-03 MED ORDER — METRONIDAZOLE IN NACL 5-0.79 MG/ML-% IV SOLN
500.0000 mg | INTRAVENOUS | Status: AC
  Administered 2020-05-03 (×4): 500 mg via INTRAVENOUS
  Filled 2020-05-03 (×4): qty 100

## 2020-05-03 NOTE — Discharge Instructions (Signed)
Morning Sickness  Morning sickness is when a woman feels nauseous during pregnancy. This nauseous feeling may or may not come with vomiting. It often occurs in the morning, but it can be a problem at any time of day. Morning sickness is most common during the first trimester. In some cases, it may continue throughout pregnancy. Although morning sickness is unpleasant, it is usually harmless unless the woman develops severe and continual vomiting (hyperemesis gravidarum), a condition that requires more intense treatment. What are the causes? The exact cause of this condition is not known, but it seems to be related to normal hormonal changes that occur in pregnancy. What increases the risk? You are more likely to develop this condition if:  You experienced nausea or vomiting before your pregnancy.  You had morning sickness during a previous pregnancy.  You are pregnant with more than one baby, such as twins. What are the signs or symptoms? Symptoms of this condition include:  Nausea.  Vomiting. How is this diagnosed? This condition is usually diagnosed based on your signs and symptoms. How is this treated? In many cases, treatment is not needed for this condition. Making some changes to what you eat may help to control symptoms. Your health care provider may also prescribe or recommend:  Vitamin B6 supplements.  Anti-nausea medicines.  Ginger. Follow these instructions at home: Medicines  Take over-the-counter and prescription medicines only as told by your health care provider. Do not use any prescription, over-the-counter, or herbal medicines for morning sickness without first talking with your health care provider.  Take multivitamins before getting pregnant. This can prevent or decrease the severity of morning sickness in most women. Eating and drinking  Eat a piece of dry toast or crackers before getting out of bed in the morning.  Eat 5 or 6 small meals a day.  Eat dry  and bland foods, such as rice or a baked potato. Foods that are high in carbohydrates are often helpful.  Avoid greasy, fatty, and spicy foods.  Have someone cook for you if the smell of any food causes nausea and vomiting.  If you feel nauseous after taking prenatal vitamins, take the vitamins at night or with a snack.  Eat a protein snack between meals if you are hungry. Nuts, yogurt, and cheese are good options.  Drink fluids throughout the day.  Try ginger ale made with real ginger, ginger tea made from fresh grated ginger, or ginger candies. General instructions  Do not use any products that contain nicotine or tobacco. These products include cigarettes, chewing tobacco, and vaping devices, such as e-cigarettes. If you need help quitting, ask your health care provider.  Get an air purifier to keep the air in your house free of odors.  Get plenty of fresh air.  Try to avoid odors that trigger your nausea.  Consider trying these methods to help relieve symptoms: ? Wearing an acupressure wristband. These wristbands are often worn for seasickness. ? Acupuncture. Contact a health care provider if:  Your home remedies are not working and you need medicine.  You feel dizzy or light-headed.  You are losing weight. Get help right away if:  You have persistent and uncontrolled nausea and vomiting.  You faint.  You have severe pain in your abdomen. Summary  Morning sickness is when a woman feels nauseous during pregnancy. This nauseous feeling may or may not come with vomiting.  Morning sickness is most common during the first trimester.  It often occurs in the  morning, but it can be a problem at any time of day.  In many cases, treatment is not needed for this condition. Making some changes to what you eat may help to control symptoms. This information is not intended to replace advice given to you by your health care provider. Make sure you discuss any questions you have  with your health care provider. Document Revised: 08/15/2019 Document Reviewed: 07/25/2019 Elsevier Patient Education  2021 [place]. Constipation, Adult Constipation is when a person has fewer than three bowel movements in a week, has difficulty having a bowel movement, or has stools (feces) that are dry, hard, or larger than normal. Constipation may be caused by an underlying condition. It may become worse with age if a person takes certain medicines and does not take in enough fluids. Follow these instructions at home: Eating and drinking  Eat foods that have a lot of fiber, such as beans, whole grains, and fresh fruits and vegetables.  Limit foods that are low in fiber and high in fat and processed sugars, such as fried or sweet foods. These include french fries, hamburgers, cookies, candies, and soda.  Drink enough fluid to keep your urine pale yellow.   General instructions  Exercise regularly or as told by your health care provider. Try to do 150 minutes of moderate exercise each week.  Use the bathroom when you have the urge to go. Do not hold it in.  Take over-the-counter and prescription medicines only as told by your health care provider. This includes any fiber supplements.  During bowel movements: ? Practice deep breathing while relaxing the lower abdomen. ? Practice pelvic floor relaxation.  Watch your condition for any changes. Let your health care provider know about them.  Keep all follow-up visits as told by your health care provider. This is important. Contact a health care provider if:  You have pain that gets worse.  You have a fever.  You do not have a bowel movement after 4 days.  You vomit.  You are not hungry or you lose weight.  You are bleeding from the opening between the buttocks (anus).  You have thin, pencil-like stools. Get help right away if:  You have a fever and your symptoms suddenly get worse.  You leak stool or have blood in  your stool.  Your abdomen is bloated.  You have severe pain in your abdomen.  You feel dizzy or you faint. Summary  Constipation is when a person has fewer than three bowel movements in a week, has difficulty having a bowel movement, or has stools (feces) that are dry, hard, or larger than normal.  Eat foods that have a lot of fiber, such as beans, whole grains, and fresh fruits and vegetables.  Drink enough fluid to keep your urine pale yellow.  Take over-the-counter and prescription medicines only as told by your health care provider. This includes any fiber supplements. This information is not intended to replace advice given to you by your health care provider. Make sure you discuss any questions you have with your health care provider. Document Revised: 11/17/2018 Document Reviewed: 11/17/2018 Elsevier Patient Education  [place].

## 2020-05-03 NOTE — MAU Note (Signed)
Stacy Fowler is a 30 y.o. at [redacted]w[redacted]d here in MAU reporting: states she is dehydrated. Unable to keep down liquids and has not eaten in days. Was Rx phenergan and zofran but states she has not taken anything since yesterday.  Onset of complaint: ongoing  Pain score: 0/10  Vitals:   05/03/20 1516  BP: (!) 135/106  Pulse: (!) 116  Resp: 18  Temp: 98.3 F (36.8 C)  SpO2: 100%     Lab orders placed from triage: UA, unable to urinate

## 2020-05-03 NOTE — MAU Provider Note (Signed)
History     CSN: [id number]  Arrival date and time: 05/03/20 1448   Event Date/Time   First Provider Initiated Contact with Patient 05/03/20 1554      Chief Complaint  Patient presents with  . Nausea  . Emesis   HPI Stacy Fowler is a 30 y.o. G1P0 at [redacted]w[redacted]d who presents with nausea & vomiting. This has been an ongoing issue with her pregnancy. Has vomited 5 times today. States she was only able to keep down gatorade but now can't even keep that down. Has zofran to take at home but reports it isn't effective and last took it yesterday. Denies fever/chills, abdominal pain, vaginal bleeding, or diarrhea.  Was diagnosed with trichomonas last week but hasn't taken the treatment for it.   OB History    Gravida  1   Para      Term      Preterm      AB      Living        SAB      IAB      Ectopic      Multiple      Live Births              Past Medical History:  Diagnosis Date  . Chronic hypertension   . Genital herpes   . Infection    UTI  . Obesity   . Skin infection     Past Surgical History:  Procedure Laterality Date  . BREAST REDUCTION SURGERY      Family History  Problem Relation Age of Onset  . Diabetes Other   . Hypertension Other   . Healthy Mother     Social History   Tobacco Use  . Smoking status: Never Smoker  . Smokeless tobacco: Never Used  Vaping Use  . Vaping Use: Never used  Substance Use Topics  . Alcohol use: No  . Drug use: No    Allergies:  Allergies  Allergen Reactions  . Other     Cantaloupe: itching    Medications Prior to Admission  Medication Sig Dispense Refill Last Dose  . Prenatal Vit-Fe Fumarate-FA (PRENATAL VITAMINS PO) Prenatal Vitamin   05/02/2020 at Unknown time  . famotidine (PEPCID) 20 MG tablet Take 1 tablet (20 mg total) by mouth 2 (two) times daily. 60 tablet 0   . famotidine (PEPCID) 40 MG tablet Take 1 tablet (40 mg total) by mouth daily. 90 tablet 0   . labetalol (NORMODYNE) 100 MG tablet TAKE  1 TABLET BY MOUTH 2 TIMES DAILY 60 tablet 0   . labetalol (NORMODYNE) 200 MG tablet Take 1 tablet by mouth twice a day. 60 tablet 1   . metroNIDAZOLE (FLAGYL) 500 MG tablet Take your nausea medication first, then take two tablets by mouth twice a day, for one day.  Or you can take all four tablets at once if you can tolerate it. Take with food. 4 tablet 0   . ondansetron (ZOFRAN-ODT) 4 MG disintegrating tablet Dissolve 1 tablet sublingually every 8 hours as needed 24 tablet 1   . promethazine (PHENERGAN) 25 MG tablet Take 0.5 - 1 tablets (12.5-25 mg total) by mouth every 6 hours as needed for nausea. 30 tablet 2   . valACYclovir (VALTREX) 500 MG tablet TAKE 1 TABLET BY MOUTH TWO TIMES DAILY FOR 3 DAYS AS NEEDED 10 tablet 2   . [DISCONTINUED] metoCLOPramide (REGLAN) 10 MG tablet Take 1 tablet (10 mg total) by mouth every 8 (  eight) hours as needed for nausea. 30 tablet 0   . [DISCONTINUED] scopolamine (TRANSDERM-SCOP, 1.5 MG,) 1 MG/3DAYS Place 1 patch (1.5 mg total) onto the skin every 3 (three) days. 10 patch 0     Review of Systems  Constitutional: Negative.   Gastrointestinal: Positive for nausea and vomiting. Negative for abdominal pain, constipation and diarrhea.  Genitourinary: Negative.    Physical Exam   Blood pressure 106/80, pulse (!) 101, temperature 98.3 F (36.8 C), temperature source Oral, resp. rate 18, height 5\' 7"  (1.702 m), weight 134.2 kg, last menstrual period 03/05/2020, SpO2 100 %.  Physical Exam Vitals and nursing note reviewed.  Constitutional:      General: She is not in acute distress.    Appearance: Normal appearance.  HENT:     Head: Normocephalic and atraumatic.  Cardiovascular:     Rate and Rhythm: Regular rhythm. Tachycardia present.  Pulmonary:     Effort: Pulmonary effort is normal. No respiratory distress.  Neurological:     Mental Status: She is alert.  Psychiatric:        Mood and Affect: Mood normal.        Behavior: Behavior normal.     MAU  Course  Procedures Results for orders placed or performed during the hospital encounter of 05/03/20 (from the past 24 hour(s))  Basic metabolic panel     Status: Abnormal   Collection Time: 05/03/20  4:26 PM  Result Value Ref Range   Sodium 135 135 - 145 mmol/L   Potassium 3.2 (L) 3.5 - 5.1 mmol/L   Chloride 100 98 - 111 mmol/L   CO2 21 (L) 22 - 32 mmol/L   Glucose, Bld 111 (H) 70 - 99 mg/dL   BUN 14 6 - 20 mg/dL   Creatinine, Ser 0.88 0.44 - 1.00 mg/dL   Calcium 9.9 8.9 - 10.3 mg/dL   GFR, Estimated >60 >60 mL/min   Anion gap 14 5 - 15    MDM Patient presents with continued n/v that she hasn't treated today. Given IV fluids, reglan, and pepcid.  Patient no longer nauseated & hasn't vomited. Discussing using these meds at home - per chart these medications were previously prescribed but patient states they weren't at the pharmacy. Will re send rx for pepcid, reglan, and scop patch  2 gm IV flagyl given for trichomonas treatment since patient didn't tolerate oral regimen.   Assessment and Plan   1. Nausea and vomiting during pregnancy prior to [redacted] weeks gestation   2. Trichomonal vaginitis during pregnancy in first trimester   3. [redacted] weeks gestation of pregnancy    -discussed medication regimen. rx reglan, scop patch, & pepcid -no intercourse x 2 weeks. Partner has been treated -decrease zofran d/t constipation  Jorje Guild 05/03/2020, 6:59 PM

## 2020-05-04 ENCOUNTER — Other Ambulatory Visit (HOSPITAL_COMMUNITY): Payer: Self-pay

## 2020-05-09 ENCOUNTER — Other Ambulatory Visit (HOSPITAL_COMMUNITY): Payer: Self-pay

## 2020-05-09 DIAGNOSIS — Z3201 Encounter for pregnancy test, result positive: Secondary | ICD-10-CM | POA: Diagnosis not present

## 2020-05-09 DIAGNOSIS — R112 Nausea with vomiting, unspecified: Secondary | ICD-10-CM | POA: Diagnosis not present

## 2020-05-09 MED ORDER — ONDANSETRON 4 MG PO TBDP
ORAL_TABLET | ORAL | 1 refills | Status: DC
  Filled 2020-05-09: qty 9, 3d supply, fill #0

## 2020-05-10 ENCOUNTER — Other Ambulatory Visit (HOSPITAL_COMMUNITY): Payer: Self-pay

## 2020-05-10 MED ORDER — ONDANSETRON 4 MG PO TBDP
ORAL_TABLET | ORAL | 1 refills | Status: DC
  Filled 2020-05-10: qty 9, 3d supply, fill #0

## 2020-05-17 ENCOUNTER — Other Ambulatory Visit (HOSPITAL_COMMUNITY): Payer: Self-pay

## 2020-06-09 ENCOUNTER — Inpatient Hospital Stay (HOSPITAL_COMMUNITY)
Admission: AD | Admit: 2020-06-09 | Discharge: 2020-06-15 | DRG: 831 | Disposition: A | Discharge status: Home / Self Care | Payer: Medicaid Other | Attending: Internal Medicine | Admitting: Internal Medicine

## 2020-06-09 ENCOUNTER — Other Ambulatory Visit: Payer: Self-pay

## 2020-06-09 ENCOUNTER — Encounter (HOSPITAL_COMMUNITY): Payer: Self-pay | Admitting: Family Medicine

## 2020-06-09 DIAGNOSIS — R7989 Other specified abnormal findings of blood chemistry: Secondary | ICD-10-CM

## 2020-06-09 DIAGNOSIS — E039 Hypothyroidism, unspecified: Secondary | ICD-10-CM | POA: Diagnosis present

## 2020-06-09 DIAGNOSIS — O99282 Endocrine, nutritional and metabolic diseases complicating pregnancy, second trimester: Secondary | ICD-10-CM | POA: Diagnosis present

## 2020-06-09 DIAGNOSIS — R945 Abnormal results of liver function studies: Secondary | ICD-10-CM

## 2020-06-09 DIAGNOSIS — R748 Abnormal levels of other serum enzymes: Secondary | ICD-10-CM

## 2020-06-09 DIAGNOSIS — O219 Vomiting of pregnancy, unspecified: Secondary | ICD-10-CM

## 2020-06-09 DIAGNOSIS — E0590 Thyrotoxicosis, unspecified without thyrotoxic crisis or storm: Secondary | ICD-10-CM

## 2020-06-09 DIAGNOSIS — A6009 Herpesviral infection of other urogenital tract: Secondary | ICD-10-CM | POA: Diagnosis present

## 2020-06-09 DIAGNOSIS — E876 Hypokalemia: Secondary | ICD-10-CM

## 2020-06-09 DIAGNOSIS — O98312 Other infections with a predominantly sexual mode of transmission complicating pregnancy, second trimester: Secondary | ICD-10-CM | POA: Diagnosis present

## 2020-06-09 DIAGNOSIS — O26832 Pregnancy related renal disease, second trimester: Secondary | ICD-10-CM | POA: Diagnosis present

## 2020-06-09 DIAGNOSIS — A5901 Trichomonal vulvovaginitis: Secondary | ICD-10-CM | POA: Diagnosis present

## 2020-06-09 DIAGNOSIS — I10 Essential (primary) hypertension: Secondary | ICD-10-CM | POA: Diagnosis present

## 2020-06-09 DIAGNOSIS — R1011 Right upper quadrant pain: Secondary | ICD-10-CM

## 2020-06-09 DIAGNOSIS — K851 Biliary acute pancreatitis without necrosis or infection: Secondary | ICD-10-CM | POA: Diagnosis present

## 2020-06-09 DIAGNOSIS — K5900 Constipation, unspecified: Secondary | ICD-10-CM | POA: Diagnosis present

## 2020-06-09 DIAGNOSIS — N179 Acute kidney failure, unspecified: Secondary | ICD-10-CM

## 2020-06-09 DIAGNOSIS — O26612 Liver and biliary tract disorders in pregnancy, second trimester: Principal | ICD-10-CM | POA: Diagnosis present

## 2020-06-09 DIAGNOSIS — E871 Hypo-osmolality and hyponatremia: Secondary | ICD-10-CM

## 2020-06-09 DIAGNOSIS — O99012 Anemia complicating pregnancy, second trimester: Secondary | ICD-10-CM | POA: Diagnosis present

## 2020-06-09 DIAGNOSIS — E860 Dehydration: Secondary | ICD-10-CM | POA: Diagnosis present

## 2020-06-09 DIAGNOSIS — O210 Mild hyperemesis gravidarum: Secondary | ICD-10-CM

## 2020-06-09 DIAGNOSIS — Z20822 Contact with and (suspected) exposure to covid-19: Secondary | ICD-10-CM | POA: Diagnosis present

## 2020-06-09 DIAGNOSIS — O2342 Unspecified infection of urinary tract in pregnancy, second trimester: Secondary | ICD-10-CM | POA: Diagnosis present

## 2020-06-09 DIAGNOSIS — N390 Urinary tract infection, site not specified: Secondary | ICD-10-CM | POA: Diagnosis present

## 2020-06-09 DIAGNOSIS — O211 Hyperemesis gravidarum with metabolic disturbance: Secondary | ICD-10-CM | POA: Diagnosis present

## 2020-06-09 DIAGNOSIS — O99212 Obesity complicating pregnancy, second trimester: Secondary | ICD-10-CM | POA: Diagnosis present

## 2020-06-09 DIAGNOSIS — Z3A14 14 weeks gestation of pregnancy: Secondary | ICD-10-CM

## 2020-06-09 DIAGNOSIS — O209 Hemorrhage in early pregnancy, unspecified: Secondary | ICD-10-CM

## 2020-06-09 MED ORDER — ONDANSETRON HCL 4 MG/2ML IJ SOLN
4.0000 mg | Freq: Once | INTRAMUSCULAR | Status: AC
  Administered 2020-06-09: 4 mg via INTRAVENOUS
  Filled 2020-06-09: qty 2

## 2020-06-09 MED ORDER — LACTATED RINGERS IV BOLUS: 1000.0000 mL | Freq: Once | INTRAVENOUS | Status: DC

## 2020-06-09 MED ORDER — FAMOTIDINE IN NACL 20-0.9 MG/50ML-% IV SOLN
20.0000 mg | Freq: Once | INTRAVENOUS | Status: AC
  Administered 2020-06-09: 20 mg via INTRAVENOUS
  Filled 2020-06-09: qty 50

## 2020-06-09 MED ORDER — SODIUM CHLORIDE 0.9 % IV SOLN
25.0000 mg | Freq: Four times a day (QID) | INTRAVENOUS | Status: DC | PRN
  Administered 2020-06-10: 25 mg via INTRAVENOUS
  Filled 2020-06-09: qty 1

## 2020-06-09 MED ORDER — LACTATED RINGERS IV BOLUS
1000.0000 mL | Freq: Once | INTRAVENOUS | Status: AC
  Administered 2020-06-09: 1000 mL via INTRAVENOUS

## 2020-06-09 NOTE — MAU Note (Signed)
Pt reports she has has extreme fatigue for a few weeks. C/o nausea and vomiting and lack of appetite. Has not eaten an full meal in over a weak.  C/o abd pressure of and on. Denies any vag bleeding reports some brown vag discharge for a few weeks.

## 2020-06-09 NOTE — MAU Provider Note (Signed)
Chief Complaint: Fatigue and Nausea   Event Date/Time   First Provider Initiated Contact with Patient 06/09/20 2252      SUBJECTIVE HPI: Stacy Fowler is a 30 y.o. G1P0 at [redacted]w[redacted]d by LMP who presents to maternity admissions reporting nausea and vomiting since early pregnancy, worsening in the last few days. She cannot keep anything down and reports she has not eaten a meal in at least 1 week.  She also reports fatigue and brown vaginal spotting. She has not yet started prenatal care in this pregnancy and ran out of the nausea medications prescribed from MAU on 05/03/20.  She reports she completed her treatment for trichomonas prescribed 04/26/20.   Location: generalized abdomen upper and lower Quality: cramping Severity: 5/10 on pain scale Duration: 3+ weeks Timing: constant Modifying factors: none Associated signs and symptoms: nausea/vomiting, unable to eat  HPI  Past Medical History:  Diagnosis Date  . Chronic hypertension   . Genital herpes   . Infection    UTI  . Obesity   . Skin infection    Past Surgical History:  Procedure Laterality Date  . BREAST REDUCTION SURGERY     Social History   Socioeconomic History  . Marital status: Single    Spouse name: Not on file  . Number of children: Not on file  . Years of education: Not on file  . Highest education level: Not on file  Occupational History  . Not on file  Tobacco Use  . Smoking status: Never Smoker  . Smokeless tobacco: Never Used  Vaping Use  . Vaping Use: Never used  Substance and Sexual Activity  . Alcohol use: No  . Drug use: No  . Sexual activity: Not Currently    Birth control/protection: None  Other Topics Concern  . Not on file  Social History Narrative  . Not on file   Social Determinants of Health   Financial Resource Strain: Not on file  Food Insecurity: Not on file  Transportation Needs: Not on file  Physical Activity: Not on file  Stress: Not on file  Social Connections: Not on file   Intimate Partner Violence: Not on file   No current facility-administered medications on file prior to encounter.   Current Outpatient Medications on File Prior to Encounter  Medication Sig Dispense Refill  . labetalol (NORMODYNE) 100 MG tablet TAKE 1 TABLET BY MOUTH 2 TIMES DAILY 60 tablet 0  . labetalol (NORMODYNE) 200 MG tablet Take 1 tablet by mouth twice a day. 60 tablet 1  . metoCLOPramide (REGLAN) 10 MG tablet Take 1 tablet (10 mg total) by mouth every 8 (eight) hours as needed for nausea. 30 tablet 0  . ondansetron (ZOFRAN-ODT) 4 MG disintegrating tablet Dissolve 1 tablet sublingually every 8 hours as needed 24 tablet 1  . ondansetron (ZOFRAN-ODT) 4 MG disintegrating tablet Place 1 tablet under the tongue every 8 hours as needed. 24 tablet 1  . Prenatal Vit-Fe Fumarate-FA (PRENATAL VITAMINS PO) Prenatal Vitamin    . famotidine (PEPCID) 20 MG tablet Take 1 tablet (20 mg total) by mouth 2 (two) times daily. 60 tablet 0  . famotidine (PEPCID) 40 MG tablet Take 1 tablet (40 mg total) by mouth daily. 90 tablet 0  . ondansetron (ZOFRAN-ODT) 4 MG disintegrating tablet Dissolve 1 tablet under the tongue every 8 hours as needed 24 tablet 1  . valACYclovir (VALTREX) 500 MG tablet TAKE 1 TABLET BY MOUTH TWO TIMES DAILY FOR 3 DAYS AS NEEDED 10 tablet 2  . [  DISCONTINUED] fluticasone (FLONASE) 50 MCG/ACT nasal spray Place 2 sprays into both nostrils daily. 1 g 0  . [DISCONTINUED] ipratropium (ATROVENT) 0.06 % nasal spray Place 2 sprays into both nostrils 4 (four) times daily. 15 mL 0  . [DISCONTINUED] sodium chloride (OCEAN) 0.65 % SOLN nasal spray Place 1 spray into both nostrils 3 (three) times daily as needed for congestion.     Allergies  Allergen Reactions  . Other     Cantaloupe: itching    ROS:  Review of Systems  Constitutional: Positive for fatigue. Negative for chills and fever.  HENT: Negative for congestion and sore throat.   Respiratory: Negative for shortness of breath.    Cardiovascular: Negative for chest pain.  Gastrointestinal: Positive for abdominal pain, nausea and vomiting. Negative for constipation.  Genitourinary: Negative for difficulty urinating, dysuria, flank pain, pelvic pain, vaginal bleeding, vaginal discharge and vaginal pain.  Neurological: Negative for dizziness and headaches.  Psychiatric/Behavioral: Negative.      I have reviewed patient's Past Medical Hx, Surgical Hx, Family Hx, Social Hx, medications and allergies.   Physical Exam   Patient Vitals for the past 24 hrs:  BP Temp Temp src Pulse SpO2 Height Weight  06/09/20 2206 115/70 (!) 97.5 F (36.4 C) Axillary (!) 142 100 % 5\' 7"  (1.702 m) 117.9 kg   Constitutional: Well-developed, well-nourished female in moderate distress.  HEART: tachycardia, normal heart sounds, regular rhythm RESP: normal effort, lung sounds clear and equal bilaterally GI: Abd soft, mildly tender throughout. No rebound tenderness or guarding. Pos BS x 4 MS: Extremities nontender, no edema, normal ROM Neurologic: Alert and oriented x 4.  GU: Neg CVAT.  PELVIC EXAM: vaginal cultures collected by blind swab  FHT 138 by doppler  LAB RESULTS Results for orders placed or performed during the hospital encounter of 06/09/20 (from the past 24 hour(s))  CBC     Status: Abnormal   Collection Time: 06/09/20 11:40 PM  Result Value Ref Range   WBC 10.1 4.0 - 10.5 K/uL   RBC 4.14 3.87 - 5.11 MIL/uL   Hemoglobin 11.2 (L) 12.0 - 15.0 g/dL   HCT 32.7 (L) 36.0 - 46.0 %   MCV 79.0 (L) 80.0 - 100.0 fL   MCH 27.1 26.0 - 34.0 pg   MCHC 34.3 30.0 - 36.0 g/dL   RDW 11.9 11.5 - 15.5 %   Platelets 256 150 - 400 K/uL   nRBC 0.0 0.0 - 0.2 %  Comprehensive metabolic panel     Status: Abnormal   Collection Time: 06/09/20 11:40 PM  Result Value Ref Range   Sodium 124 (L) 135 - 145 mmol/L   Potassium 2.6 (LL) 3.5 - 5.1 mmol/L   Chloride 76 (L) 98 - 111 mmol/L   CO2 27 22 - 32 mmol/L   Glucose, Bld 165 (H) 70 - 99 mg/dL    BUN 56 (H) 6 - 20 mg/dL   Creatinine, Ser 2.58 (H) 0.44 - 1.00 mg/dL   Calcium 9.5 8.9 - 10.3 mg/dL   Total Protein 7.3 6.5 - 8.1 g/dL   Albumin 2.7 (L) 3.5 - 5.0 g/dL   AST 286 (H) 15 - 41 U/L   ALT 309 (H) 0 - 44 U/L   Alkaline Phosphatase 67 38 - 126 U/L   Total Bilirubin 3.1 (H) 0.3 - 1.2 mg/dL   GFR, Estimated 25 (L) >60 mL/min   Anion gap 21 (H) 5 - 15  Lipase, blood     Status: Abnormal   Collection  Time: 06/10/20  2:20 AM  Result Value Ref Range   Lipase 307 (H) 11 - 51 U/L  Wet prep, genital     Status: Abnormal   Collection Time: 06/10/20  2:34 AM   Specimen: Genital  Result Value Ref Range   Yeast Wet Prep HPF POC NONE SEEN NONE SEEN   Trich, Wet Prep PRESENT (A) NONE SEEN   Clue Cells Wet Prep HPF POC PRESENT (A) NONE SEEN   WBC, Wet Prep HPF POC FEW (A) NONE SEEN   Sperm NONE SEEN        IMAGING US OB Comp Less 14 Wks  Result Date: 06/10/2020 CLINICAL DATA:  Bleeding EXAM: OBSTETRIC <14 WK ULTRASOUND TECHNIQUE: Transabdominal ultrasound was performed for evaluation of the gestation as well as the maternal uterus and adnexal regions. COMPARISON:  None. FINDINGS: Intrauterine gestational sac: Single Yolk sac:  Not visualized Embryo:  Visualized Cardiac Activity: Visualized Heart Rate: 164 bpm CRL: 78 mm   13 w 6 d                  [place] EDC: 12/10/2020 Subchorionic hemorrhage:  None visualized. Maternal uterus/adnexae: Ovaries nonvisualized. Large exophytic mass at the uterine fundus measuring 13.4 x 9.3 x 14.9 cm. No free fluid IMPRESSION: 1. Single viable intrauterine pregnancy as above. 2. Large 14.9 cm exophytic fibroid off the uterine fundus. Otherwise no specific abnormality is seen. Electronically Signed   By: Donavan Foil M.D.   On: 06/10/2020 01:45   US Abdomen Limited RUQ (LIVER/GB)  Result Date: 06/10/2020 CLINICAL DATA:  Nausea and vomiting, right upper quadrant pain, [redacted] weeks pregnant, increased liver function tests EXAM: ULTRASOUND ABDOMEN LIMITED RIGHT  UPPER QUADRANT COMPARISON:  None. FINDINGS: Gallbladder: Gallbladder is moderately distended, with gallbladder sludge layering dependently. No shadowing gallstones. There is borderline gallbladder wall thickening measuring 4 mm. No pericholecystic fluid. Negative sonographic Murphy sign. Common bile duct: Diameter: 3 mm Liver: No focal lesion identified. Within normal limits in parenchymal echogenicity. Portal vein is patent on color Doppler imaging with normal direction of blood flow towards the liver. Other: None. IMPRESSION: 1. Distended gallbladder, with internal echogenic sludge and borderline gallbladder wall thickening. No shadowing gallstones and negative sonographic Murphy sign. Findings are equivocal for acute cholecystitis. Electronically Signed   By: Randa Ngo M.D.   On: 06/10/2020 03:36    MAU Management/MDM: Orders Placed This Encounter  Procedures  . Wet prep, genital  . US OB Comp Less 14 Wks  . US Abdomen Limited RUQ (LIVER/GB)  . Urinalysis, Routine w reflex microscopic Urine, Clean Catch  . CBC  . Comprehensive metabolic panel  . Lipase, blood  . Hepatitis panel, acute  . Magnesium  . ED EKG  . EKG 12-Lead  . Admit to Inpatient (patient's expected length of stay will be greater than 2 midnights or inpatient only procedure)    Meds ordered this encounter  Medications  . lactated ringers bolus 1,000 mL  . promethazine (PHENERGAN) 25 mg in sodium chloride 0.9 % 50 mL IVPB  . famotidine (PEPCID) IVPB 20 mg premix  . ondansetron (ZOFRAN) injection 4 mg  . DISCONTD: lactated ringers bolus 1,000 mL  . DISCONTD: lactated ringers 1,000 mL with potassium chloride 20 mEq infusion  . lactated ringers 1,000 mL with potassium chloride 20 mEq infusion    EKG: sinus tachycardia.  Pt with improvement in n/v with IV fluids and antiemetics.  OB US with normal pregnancy with size equal to LMP dates, large exophytic  fibroid off uterine fundus.  Pt to f/u with early prenatal care.  List provided. Labs returned with elevated liver enzymes, creatinine and lipase.  RUQ ultrasound ordered and evidence of cholecystitis.  Consult Dr Elly Modena who recommends consult to medicine services.  Called [facility] and pt admitted to medicine service.     ASSESSMENT 1. Acute gallstone pancreatitis   2. Vaginal bleeding in pregnancy, first trimester   3. RUQ pain   4. Nausea and vomiting during pregnancy prior to [redacted] weeks gestation   5. Hyperemesis gravidarum   6. Elevated liver enzymes   7. Elevated serum creatinine   8. Trichomonal vaginitis during pregnancy in first trimester     PLAN Admit to medicine service  Fatima Blank Certified Nurse-Midwife 06/10/2020  4:41 AM

## 2020-06-10 ENCOUNTER — Inpatient Hospital Stay (HOSPITAL_COMMUNITY): Payer: Medicaid Other

## 2020-06-10 ENCOUNTER — Inpatient Hospital Stay: Admit: 2020-06-10 | Payer: Medicaid Other | Admitting: Family Medicine

## 2020-06-10 ENCOUNTER — Encounter (HOSPITAL_COMMUNITY): Payer: Self-pay | Admitting: Family Medicine

## 2020-06-10 DIAGNOSIS — O219 Vomiting of pregnancy, unspecified: Secondary | ICD-10-CM | POA: Diagnosis not present

## 2020-06-10 DIAGNOSIS — N179 Acute kidney failure, unspecified: Secondary | ICD-10-CM

## 2020-06-10 DIAGNOSIS — O98312 Other infections with a predominantly sexual mode of transmission complicating pregnancy, second trimester: Secondary | ICD-10-CM | POA: Diagnosis present

## 2020-06-10 DIAGNOSIS — E876 Hypokalemia: Secondary | ICD-10-CM | POA: Diagnosis present

## 2020-06-10 DIAGNOSIS — A5901 Trichomonal vulvovaginitis: Secondary | ICD-10-CM | POA: Diagnosis present

## 2020-06-10 DIAGNOSIS — O10911 Unspecified pre-existing hypertension complicating pregnancy, first trimester: Secondary | ICD-10-CM

## 2020-06-10 DIAGNOSIS — O99281 Endocrine, nutritional and metabolic diseases complicating pregnancy, first trimester: Secondary | ICD-10-CM

## 2020-06-10 DIAGNOSIS — O99012 Anemia complicating pregnancy, second trimester: Secondary | ICD-10-CM | POA: Diagnosis present

## 2020-06-10 DIAGNOSIS — R1011 Right upper quadrant pain: Secondary | ICD-10-CM | POA: Diagnosis not present

## 2020-06-10 DIAGNOSIS — O99212 Obesity complicating pregnancy, second trimester: Secondary | ICD-10-CM | POA: Diagnosis present

## 2020-06-10 DIAGNOSIS — E0590 Thyrotoxicosis, unspecified without thyrotoxic crisis or storm: Secondary | ICD-10-CM | POA: Diagnosis present

## 2020-06-10 DIAGNOSIS — I10 Essential (primary) hypertension: Secondary | ICD-10-CM | POA: Diagnosis not present

## 2020-06-10 DIAGNOSIS — O26891 Other specified pregnancy related conditions, first trimester: Secondary | ICD-10-CM

## 2020-06-10 DIAGNOSIS — O211 Hyperemesis gravidarum with metabolic disturbance: Secondary | ICD-10-CM | POA: Diagnosis present

## 2020-06-10 DIAGNOSIS — K851 Biliary acute pancreatitis without necrosis or infection: Secondary | ICD-10-CM

## 2020-06-10 DIAGNOSIS — Z3A14 14 weeks gestation of pregnancy: Secondary | ICD-10-CM | POA: Diagnosis not present

## 2020-06-10 DIAGNOSIS — O99282 Endocrine, nutritional and metabolic diseases complicating pregnancy, second trimester: Secondary | ICD-10-CM | POA: Diagnosis present

## 2020-06-10 DIAGNOSIS — O210 Mild hyperemesis gravidarum: Secondary | ICD-10-CM

## 2020-06-10 DIAGNOSIS — E039 Hypothyroidism, unspecified: Secondary | ICD-10-CM | POA: Diagnosis present

## 2020-06-10 DIAGNOSIS — O26832 Pregnancy related renal disease, second trimester: Secondary | ICD-10-CM | POA: Diagnosis present

## 2020-06-10 DIAGNOSIS — O99612 Diseases of the digestive system complicating pregnancy, second trimester: Secondary | ICD-10-CM | POA: Diagnosis not present

## 2020-06-10 DIAGNOSIS — R945 Abnormal results of liver function studies: Secondary | ICD-10-CM

## 2020-06-10 DIAGNOSIS — O209 Hemorrhage in early pregnancy, unspecified: Secondary | ICD-10-CM | POA: Diagnosis not present

## 2020-06-10 DIAGNOSIS — O26612 Liver and biliary tract disorders in pregnancy, second trimester: Secondary | ICD-10-CM | POA: Diagnosis present

## 2020-06-10 DIAGNOSIS — Z20822 Contact with and (suspected) exposure to covid-19: Secondary | ICD-10-CM | POA: Diagnosis present

## 2020-06-10 DIAGNOSIS — E860 Dehydration: Secondary | ICD-10-CM | POA: Diagnosis present

## 2020-06-10 DIAGNOSIS — A6009 Herpesviral infection of other urogenital tract: Secondary | ICD-10-CM | POA: Diagnosis present

## 2020-06-10 DIAGNOSIS — O2342 Unspecified infection of urinary tract in pregnancy, second trimester: Secondary | ICD-10-CM | POA: Diagnosis present

## 2020-06-10 DIAGNOSIS — K5900 Constipation, unspecified: Secondary | ICD-10-CM | POA: Diagnosis present

## 2020-06-10 DIAGNOSIS — O26611 Liver and biliary tract disorders in pregnancy, first trimester: Secondary | ICD-10-CM | POA: Diagnosis not present

## 2020-06-10 DIAGNOSIS — E871 Hypo-osmolality and hyponatremia: Secondary | ICD-10-CM | POA: Diagnosis present

## 2020-06-10 DIAGNOSIS — N390 Urinary tract infection, site not specified: Secondary | ICD-10-CM | POA: Diagnosis present

## 2020-06-10 DIAGNOSIS — O10012 Pre-existing essential hypertension complicating pregnancy, second trimester: Secondary | ICD-10-CM | POA: Diagnosis not present

## 2020-06-10 DIAGNOSIS — Z3A13 13 weeks gestation of pregnancy: Secondary | ICD-10-CM

## 2020-06-10 LAB — CBC
HCT: 28.9 % — ABNORMAL LOW (ref 36.0–46.0)
HCT: 32.7 % — ABNORMAL LOW (ref 36.0–46.0)
Hemoglobin: 11.2 g/dL — ABNORMAL LOW (ref 12.0–15.0)
Hemoglobin: 9.9 g/dL — ABNORMAL LOW (ref 12.0–15.0)
MCH: 27.1 pg (ref 26.0–34.0)
MCH: 27.3 pg (ref 26.0–34.0)
MCHC: 34.3 g/dL (ref 30.0–36.0)
MCHC: 34.3 g/dL (ref 30.0–36.0)
MCV: 79 fL — ABNORMAL LOW (ref 80.0–100.0)
MCV: 79.8 fL — ABNORMAL LOW (ref 80.0–100.0)
Platelets: 218 10*3/uL (ref 150–400)
Platelets: 256 10*3/uL (ref 150–400)
RBC: 3.62 MIL/uL — ABNORMAL LOW (ref 3.87–5.11)
RBC: 4.14 MIL/uL (ref 3.87–5.11)
RDW: 11.9 % (ref 11.5–15.5)
RDW: 12.1 % (ref 11.5–15.5)
WBC: 10.1 10*3/uL (ref 4.0–10.5)
WBC: 7.8 10*3/uL (ref 4.0–10.5)
nRBC: 0 % (ref 0.0–0.2)
nRBC: 0 % (ref 0.0–0.2)

## 2020-06-10 LAB — URINALYSIS, ROUTINE W REFLEX MICROSCOPIC
Bilirubin Urine: NEGATIVE
Glucose, UA: NEGATIVE mg/dL
Ketones, ur: 5 mg/dL — AB
Nitrite: NEGATIVE
Protein, ur: 30 mg/dL — AB
Specific Gravity, Urine: 1.01 (ref 1.005–1.030)
pH: 5 (ref 5.0–8.0)

## 2020-06-10 LAB — WET PREP, GENITAL
Sperm: NONE SEEN
Yeast Wet Prep HPF POC: NONE SEEN

## 2020-06-10 LAB — COMPREHENSIVE METABOLIC PANEL
ALT: 309 U/L — ABNORMAL HIGH (ref 0–44)
ALT: 263 U/L — ABNORMAL HIGH (ref 0–44)
AST: 286 U/L — ABNORMAL HIGH (ref 15–41)
AST: 260 U/L — ABNORMAL HIGH (ref 15–41)
Albumin: 2.7 g/dL — ABNORMAL LOW (ref 3.5–5.0)
Albumin: 2.3 g/dL — ABNORMAL LOW (ref 3.5–5.0)
Alkaline Phosphatase: 67 U/L (ref 38–126)
Alkaline Phosphatase: 57 U/L (ref 38–126)
Anion gap: 21 — ABNORMAL HIGH (ref 5–15)
Anion gap: 15 (ref 5–15)
BUN: 56 mg/dL — ABNORMAL HIGH (ref 6–20)
BUN: 53 mg/dL — ABNORMAL HIGH (ref 6–20)
CO2: 27 mmol/L (ref 22–32)
CO2: 27 mmol/L (ref 22–32)
Calcium: 9.5 mg/dL (ref 8.9–10.3)
Calcium: 9 mg/dL (ref 8.9–10.3)
Chloride: 76 mmol/L — ABNORMAL LOW (ref 98–111)
Chloride: 83 mmol/L — ABNORMAL LOW (ref 98–111)
Creatinine, Ser: 2.58 mg/dL — ABNORMAL HIGH (ref 0.44–1.00)
Creatinine, Ser: 2.31 mg/dL — ABNORMAL HIGH (ref 0.44–1.00)
GFR, Estimated: 25 mL/min — ABNORMAL LOW (ref >60)
GFR, Estimated: 28 mL/min — ABNORMAL LOW (ref >60)
Glucose, Bld: 165 mg/dL — ABNORMAL HIGH (ref 70–99)
Glucose, Bld: 136 mg/dL — ABNORMAL HIGH (ref 70–99)
Potassium: 2.6 mmol/L — CL (ref 3.5–5.1)
Potassium: 2.9 mmol/L — ABNORMAL LOW (ref 3.5–5.1)
Sodium: 124 mmol/L — ABNORMAL LOW (ref 135–145)
Sodium: 125 mmol/L — ABNORMAL LOW (ref 135–145)
Total Bilirubin: 3.1 mg/dL — ABNORMAL HIGH (ref 0.3–1.2)
Total Bilirubin: 2.6 mg/dL — ABNORMAL HIGH (ref 0.3–1.2)
Total Protein: 7.3 g/dL (ref 6.5–8.1)
Total Protein: 6.2 g/dL — ABNORMAL LOW (ref 6.5–8.1)

## 2020-06-10 LAB — RESP PANEL BY RT-PCR (FLU A&B, COVID) ARPGX2
Influenza A by PCR: NEGATIVE
Influenza B by PCR: NEGATIVE
SARS Coronavirus 2 by RT PCR: NEGATIVE

## 2020-06-10 LAB — HIV ANTIBODY (ROUTINE TESTING W REFLEX): HIV Screen 4th Generation wRfx: NONREACTIVE

## 2020-06-10 LAB — MAGNESIUM: Magnesium: 2.1 mg/dL (ref 1.7–2.4)

## 2020-06-10 LAB — LIPASE, BLOOD: Lipase: 307 U/L — ABNORMAL HIGH (ref 11–51)

## 2020-06-10 LAB — TSH: TSH: <0.01 u[IU]/mL — ABNORMAL LOW (ref 0.350–4.500)

## 2020-06-10 LAB — T4, FREE: Free T4: 4.32 ng/dL — ABNORMAL HIGH (ref 0.61–1.12)

## 2020-06-10 MED ORDER — POTASSIUM CHLORIDE 2 MEQ/ML IV SOLN: INTRAVENOUS | Status: DC

## 2020-06-10 MED ORDER — THIAMINE HCL 100 MG/ML IJ SOLN
Freq: Once | INTRAVENOUS | Status: AC
  Filled 2020-06-10: qty 1000

## 2020-06-10 MED ORDER — SCOPOLAMINE 1 MG/3DAYS TD PT72
1.0000 | MEDICATED_PATCH | TRANSDERMAL | Status: DC
  Administered 2020-06-10 – 2020-06-13 (×2): 1.5 mg via TRANSDERMAL
  Filled 2020-06-10 (×3): qty 1

## 2020-06-10 MED ORDER — ONDANSETRON HCL 4 MG/2ML IJ SOLN: 4.0000 mg | Freq: Four times a day (QID) | INTRAMUSCULAR | Status: DC | PRN

## 2020-06-10 MED ORDER — ACETAMINOPHEN 650 MG RE SUPP: 650.0000 mg | Freq: Four times a day (QID) | RECTAL | Status: DC | PRN

## 2020-06-10 MED ORDER — POTASSIUM CHLORIDE CRYS ER 20 MEQ PO TBCR
40.0000 meq | EXTENDED_RELEASE_TABLET | ORAL | Status: AC
  Administered 2020-06-10: 40 meq via ORAL
  Filled 2020-06-10 (×3): qty 2

## 2020-06-10 MED ORDER — SODIUM CHLORIDE 0.9 % IV SOLN
25.0000 mg | Freq: Four times a day (QID) | INTRAVENOUS | Status: DC
  Administered 2020-06-10 – 2020-06-14 (×6): 25 mg via INTRAVENOUS
  Filled 2020-06-10 (×19): qty 1

## 2020-06-10 MED ORDER — LACTATED RINGERS IV SOLN: INTRAVENOUS | Status: DC

## 2020-06-10 MED ORDER — ONDANSETRON 4 MG PO TBDP
4.0000 mg | ORAL_TABLET | Freq: Four times a day (QID) | ORAL | Status: DC
  Administered 2020-06-10 – 2020-06-13 (×9): 4 mg via ORAL
  Filled 2020-06-10 (×12): qty 1

## 2020-06-10 MED ORDER — POLYETHYLENE GLYCOL 3350 17 G PO PACK
17.0000 g | PACK | Freq: Two times a day (BID) | ORAL | Status: DC
  Administered 2020-06-10 – 2020-06-11 (×2): 17 g via ORAL
  Filled 2020-06-10 (×3): qty 1

## 2020-06-10 MED ORDER — POTASSIUM CHLORIDE 10 MEQ/100ML IV SOLN
10.0000 meq | INTRAVENOUS | Status: AC
  Administered 2020-06-10 (×4): 10 meq via INTRAVENOUS
  Filled 2020-06-10 (×4): qty 100

## 2020-06-10 MED ORDER — ACETAMINOPHEN 325 MG PO TABS
650.0000 mg | ORAL_TABLET | Freq: Four times a day (QID) | ORAL | Status: DC | PRN
Start: 2020-06-10 — End: 2020-06-15

## 2020-06-10 MED ORDER — FAMOTIDINE IN NACL 20-0.9 MG/50ML-% IV SOLN
20.0000 mg | Freq: Two times a day (BID) | INTRAVENOUS | Status: DC
  Administered 2020-06-10 – 2020-06-12 (×5): 20 mg via INTRAVENOUS
  Filled 2020-06-10 (×6): qty 50

## 2020-06-10 MED ORDER — ONDANSETRON HCL 4 MG PO TABS: 4.0000 mg | ORAL_TABLET | Freq: Four times a day (QID) | ORAL | Status: DC | PRN

## 2020-06-10 MED ORDER — LABETALOL HCL 100 MG PO TABS
100.0000 mg | ORAL_TABLET | Freq: Two times a day (BID) | ORAL | Status: DC
  Administered 2020-06-10 – 2020-06-15 (×11): 100 mg via ORAL
  Filled 2020-06-10 (×13): qty 1

## 2020-06-10 MED ORDER — POTASSIUM CHLORIDE 2 MEQ/ML IV SOLN
INTRAVENOUS | Status: AC
  Filled 2020-06-10 (×3): qty 1000

## 2020-06-10 NOTE — MAU Note (Signed)
Lab called with critical potassium value of 2.6. Lattie Haw Left-Witch CNM and primary RN notified.

## 2020-06-10 NOTE — Progress Notes (Addendum)
PROGRESS NOTE    Stacy Fowler  DGL:[id number] DOB: 13-Sep-1990 DOA: 06/09/2020 PCP: Patient, No Pcp Per (Inactive)  [id number]   Assessment & Plan:   Active Problems:   Acute gallstone pancreatitis   Stacy Fowler is a 30 y.o. female with medical history significant for being 13 weeks 6 days pregnant. Reports he has been having abdominal pain with nausea and vomiting with poor p.o. intake for the last month.  She reports she has vomiting after eating solid foods and even when she just drinks water sometimes.  She has lost weight over the last 4 to 6 weeks.  Nurse midwife reported that patient has lost 14 kg over the last month month and a half that they have been following the patient.  Her mother in the room and elaborates her story that she is not eating much.  She reports over the last few weeks has been having upper and right upper quadrant abdominal pain that is worsened after she tries to eat.   She presented to the Maternity ward  for work-up and was evaluated by the Nurse Midwife on duty.  In the work-up she was found to have AKI, low potasium level, elevated LFTs, elevated lipase. RUQ u/s revealed a distended gallbladder with sludge and wall thickening.      Acute gallstone pancreatitis --cont MIVF --advance to low-fat diet, per pt request --[place] consult today  AKI  --likely 2/2 dehydration --cont MIVF  Hypokalemia --replete and monitor  Hyperemesis gravidum Has had weight loss over the past month.  --cont MIVF --Zofran PRN  Trichomonas infection Will need treatment for trichomonas infection once vomiting is resolved.   IUP at ~14 weeks --has not been followed by Ob --Ob consult today   DVT prophylaxis: None:Ambulating Code Status: Full code  Family Communication: mother updated at bedside Level of care: Med-Surg Dispo:   The patient is from: home Anticipated d/c is to: home Anticipated d/c date is: 1-2 days Patient currently is not medically ready  to d/c due to: acute pancreatitis and AKI on IVF.   Subjective and Interval History:  Pt reported abdominal pain, N/V improved.  Complained of being hungry and wanted to eat veggies.  Reported no BM in 2 weeks.  GenSurg and OB consulted today.   Objective: Vitals:   06/09/20 2206 06/10/20 0748 06/10/20 1221  BP: 115/70 116/61 123/61  Pulse: (!) 142 (!) 124 (!) 108  Resp:  14 16  Temp: (!) 97.5 F (36.4 C) 98.6 F (37 C) 98.5 F (36.9 C)  TempSrc: Axillary Oral Oral  SpO2: 100%  100%  Weight: 117.9 kg    Height: 5\' 7"  (1.702 m)      Intake/Output Summary (Last 24 hours) at 06/10/2020 1829 Last data filed at 06/10/2020 1510 Gross per 24 hour  Intake 240 ml  Output --  Net 240 ml   Filed Weights   06/09/20 2206  Weight: 117.9 kg    Examination:   Constitutional: NAD, AAOx3 HEENT: conjunctivae and lids normal, EOMI CV: No cyanosis.   RESP: normal respiratory effort, on RA Extremities: No effusions, edema in BLE SKIN: warm, dry Neuro: II - XII grossly intact.   Psych: Subdued mood and affect.     Data Reviewed: I have personally reviewed following labs and imaging studies  CBC: Recent Labs  Lab 06/09/20 2340 06/10/20 0903  WBC 10.1 7.8  HGB 11.2* 9.9*  HCT 32.7* 28.9*  MCV 79.0* 79.8*  PLT 256 518   Basic Metabolic Panel: Recent  Labs  Lab 06/09/20 2340 06/10/20 0225 06/10/20 0903  NA 124*  --  125*  K 2.6*  --  2.9*  CL 76*  --  83*  CO2 27  --  27  GLUCOSE 165*  --  136*  BUN 56*  --  53*  CREATININE 2.58*  --  2.31*  CALCIUM 9.5  --  9.0  MG  --  2.1  --    GFR: Estimated Creatinine Clearance: 46.8 mL/min (A) (by C-G formula based on SCr of 2.31 mg/dL (H)). Liver Function Tests: Recent Labs  Lab 06/09/20 2340 06/10/20 0903  AST 286* 260*  ALT 309* 263*  ALKPHOS 67 57  BILITOT 3.1* 2.6*  PROT 7.3 6.2*  ALBUMIN 2.7* 2.3*   Recent Labs  Lab 06/10/20 0220  LIPASE 307*   No results for input(s): AMMONIA in the last 168  hours. Coagulation Profile: No results for input(s): INR, PROTIME in the last 168 hours. Cardiac Enzymes: No results for input(s): CKTOTAL, CKMB, CKMBINDEX, TROPONINI in the last 168 hours. BNP (last 3 results) No results for input(s): PROBNP in the last 8760 hours. HbA1C: No results for input(s): HGBA1C in the last 72 hours. CBG: No results for input(s): GLUCAP in the last 168 hours. Lipid Profile: No results for input(s): CHOL, HDL, LDLCALC, TRIG, CHOLHDL, LDLDIRECT in the last 72 hours. Thyroid Function Tests: No results for input(s): TSH, T4TOTAL, FREET4, T3FREE, THYROIDAB in the last 72 hours. Anemia Panel: No results for input(s): VITAMINB12, FOLATE, FERRITIN, TIBC, IRON, RETICCTPCT in the last 72 hours. Sepsis Labs: No results for input(s): PROCALCITON, LATICACIDVEN in the last 168 hours.  Recent Results (from the past 240 hour(s))  Wet prep, genital     Status: Abnormal   Collection Time: 06/10/20  2:34 AM   Specimen: Genital  Result Value Ref Range Status   Yeast Wet Prep HPF POC NONE SEEN NONE SEEN Final   Trich, Wet Prep PRESENT (A) NONE SEEN Final   Clue Cells Wet Prep HPF POC PRESENT (A) NONE SEEN Final   WBC, Wet Prep HPF POC FEW (A) NONE SEEN Final   Sperm NONE SEEN  Final    Comment: Performed at [facility], [address]. [address] [place], [place] [address]  Resp Panel by RT-PCR (Flu A&B, Covid) Nasopharyngeal Swab     Status: None   Collection Time: 06/10/20  9:00 AM   Specimen: Nasopharyngeal Swab; Nasopharyngeal(NP) swabs in vial transport medium  Result Value Ref Range Status   SARS Coronavirus 2 by RT PCR NEGATIVE NEGATIVE Final    Comment: (NOTE) SARS-CoV-2 target nucleic acids are NOT DETECTED.  The SARS-CoV-2 RNA is generally detectable in upper respiratory specimens during the acute phase of infection. The lowest concentration of SARS-CoV-2 viral copies this assay can detect is 138 copies/mL. A negative result does not preclude SARS-Cov-2 infection  and should not be used as the sole basis for treatment or other patient management decisions. A negative result may occur with  improper specimen collection/handling, submission of specimen other than nasopharyngeal swab, presence of viral mutation(s) within the areas targeted by this assay, and inadequate number of viral copies(<138 copies/mL). A negative result must be combined with clinical observations, patient history, and epidemiological information. The expected result is Negative.  Fact Sheet for Patients:  EntrepreneurPulse.com.au  Fact Sheet for Healthcare Providers:  IncredibleEmployment.be  This test is no t yet approved or cleared by the [place] FDA and  has been authorized for detection and/or diagnosis  of SARS-CoV-2 by FDA under an Emergency Use Authorization (EUA). This EUA will remain  in effect (meaning this test can be used) for the duration of the COVID-19 declaration under Section 564(b)(1) of the Act, 21 U.S.C.section 360bbb-3(b)(1), unless the authorization is terminated  or revoked sooner.       Influenza A by PCR NEGATIVE NEGATIVE Final   Influenza B by PCR NEGATIVE NEGATIVE Final    Comment: (NOTE) The Xpert Xpress SARS-CoV-2/FLU/RSV plus assay is intended as an aid in the diagnosis of influenza from Nasopharyngeal swab specimens and should not be used as a sole basis for treatment. Nasal washings and aspirates are unacceptable for Xpert Xpress SARS-CoV-2/FLU/RSV testing.  Fact Sheet for Patients: EntrepreneurPulse.com.au  Fact Sheet for Healthcare Providers: IncredibleEmployment.be  This test is not yet approved or cleared by the [place] FDA and has been authorized for detection and/or diagnosis of SARS-CoV-2 by FDA under an Emergency Use Authorization (EUA). This EUA will remain in effect (meaning this test can be used) for the duration of the COVID-19 declaration  under Section 564(b)(1) of the Act, 21 U.S.C. section 360bbb-3(b)(1), unless the authorization is terminated or revoked.  Performed at [facility], [place] [address] [place], [place] [address]       Radiology Studies: [place] Comp Less 14 Wks  Result Date: 06/10/2020 CLINICAL DATA:  Bleeding EXAM: OBSTETRIC <14 WK ULTRASOUND TECHNIQUE: Transabdominal ultrasound was performed for evaluation of the gestation as well as the maternal uterus and adnexal regions. COMPARISON:  None. FINDINGS: Intrauterine gestational sac: Single Yolk sac:  Not visualized Embryo:  Visualized Cardiac Activity: Visualized Heart Rate: 164 bpm CRL: 78 mm   13 w 6 d                  [place] EDC: 12/10/2020 Subchorionic hemorrhage:  None visualized. Maternal uterus/adnexae: Ovaries nonvisualized. Large exophytic mass at the uterine fundus measuring 13.4 x 9.3 x 14.9 cm. No free fluid IMPRESSION: 1. Single viable intrauterine pregnancy as above. 2. Large 14.9 cm exophytic fibroid off the uterine fundus. Otherwise no specific abnormality is seen. Electronically Signed   By: Donavan Foil M.D.   On: 06/10/2020 01:45   US Abdomen Limited RUQ (LIVER/GB)  Result Date: 06/10/2020 CLINICAL DATA:  Nausea and vomiting, right upper quadrant pain, [redacted] weeks pregnant, increased liver function tests EXAM: ULTRASOUND ABDOMEN LIMITED RIGHT UPPER QUADRANT COMPARISON:  None. FINDINGS: Gallbladder: Gallbladder is moderately distended, with gallbladder sludge layering dependently. No shadowing gallstones. There is borderline gallbladder wall thickening measuring 4 mm. No pericholecystic fluid. Negative sonographic Murphy sign. Common bile duct: Diameter: 3 mm Liver: No focal lesion identified. Within normal limits in parenchymal echogenicity. Portal vein is patent on color Doppler imaging with normal direction of blood flow towards the liver. Other: None. IMPRESSION: 1. Distended gallbladder, with internal echogenic sludge and borderline gallbladder  wall thickening. No shadowing gallstones and negative sonographic Murphy sign. Findings are equivocal for acute cholecystitis. Electronically Signed   By: Randa Ngo M.D.   On: 06/10/2020 03:36     Scheduled Meds: . labetalol  100 mg Oral BID  . polyethylene glycol  17 g Oral BID  . scopolamine  1 patch Transdermal Q72H   Continuous Infusions: . lactated ringers 150 mL/hr at 06/10/20 0801  . promethazine (PHENERGAN) injection (IM or IVPB) 25 mg (06/10/20 0001)     LOS: 0 days   Billed for extended service for consulting and discussing care with GenSurg, consulting Ob.   Otila Kluver  Billie Ruddy, MD [facility] If 7PM-7AM, please contact night-coverage 06/10/2020, 6:29 PM

## 2020-06-10 NOTE — H&P (Signed)
History and Physical    Stacy Fowler GUY:[id number] DOB: [birth year] DOA: 06/09/2020  PCP: Patient, No Pcp Per (Inactive)   Patient coming from:   Home  Chief Complaint: nausea and vomiting. Abdominal pain  HPI: Stacy Fowler is a 30 y.o. female with medical history significant for being 13 weeks 6 days pregnant. Reports he has been having abdominal pain with nausea and vomiting with poor p.o. intake for the last month.  She reports she has vomiting after eating solid foods and even when she just drinks water sometimes.  She has lost weight over the last 4 to 6 weeks.  Nurse midwife reported that patient has lost 14 kg over the last month month and a half that they have been following the patient.  Her mother in the room and elaborates her story that she is not eating much.  She reports over the last few weeks has been having upper and right upper quadrant abdominal pain that is worsened after she tries to eat.  She has not had any black for ground emesis.  She reports she has occasionally seen a small amount of blood in her vomit but none recently.  He has no history of ulcers.  She denies any injury or trauma to her abdomen.  She presented to the Maternity ward  for work-up and was evaluated by the Nurse Midwife on duty.  In the work-up she was found to have AKI, low potasium level, elevated LFTs, elevated lipase. RUQ u/s revealed a distended gallbladder with sludge and wall thickening.  Labs revealed WBC of 10,100, hemoglobin 11.2, hematocrit 32.7, platelets 256,000, sodium 124, potassium 2.6, chloride 76, bicarb 27, glucose 165, BUN 56, creatinine 2.58.  Baseline creatinine was 0.88.  Lipase 307, AST 286, ALT 309, bilirubin 3.1, alkaline phosphate 67.  Hospitalist service was asked to evaluate patient and admit for further management.  Review of Systems:  General: Reports decreased appetite and weighty loss. Denies  fever, chills, night sweats.  Denies dizziness.  HENT: Denies head trauma,  headache, denies change in hearing, tinnitus. Denies nasal bleeding. Denies sore throat, sores in mouth. Denies difficulty swallowing Eyes: Denies blurry vision, pain in eye, drainage.  Denies discoloration of eyes. Neck: Denies pain.  Denies swelling.  Denies pain with movement. Cardiovascular: Denies chest pain, palpitations.  Denies edema.  Denies orthopnea Respiratory: Denies shortness of breath, cough. Denies wheezing. Denies sputum production Gastrointestinal: Reports abdominal pain, nausea, vomiting. Denies diarrhea.  Denies melena.  Denies hematemesis. Musculoskeletal: Denies limitation of movement.  Denies deformity or swelling.  Denies pain.  Genitourinary: reports pelvic pain.  Denies urinary frequency or hesitancy.  Denies dysuria.  Skin: Denies rash.  Denies petechiae, purpura, ecchymosis. Neurological: Denies headache. Denies syncope. Denies seizure activity. Denies paresthesia.  Denies slurred speech, drooping face. Denies visual change. Psychiatric: Denies depression, anxiety.  Denies hallucinations.  Past Medical History:  Diagnosis Date  . Chronic hypertension   . Genital herpes   . Infection    UTI  . Obesity   . Skin infection     Past Surgical History:  Procedure Laterality Date  . BREAST REDUCTION SURGERY      Social History  reports that she has never smoked. She has never used smokeless tobacco. She reports that she does not drink alcohol and does not use drugs.  Allergies  Allergen Reactions  . Other     Cantaloupe: itching    Family History  Problem Relation Age of Onset  . Diabetes Other   .  Hypertension Other   . Healthy Mother      Prior to Admission medications   Medication Sig Start Date End Date Taking? Authorizing Provider  labetalol (NORMODYNE) 100 MG tablet TAKE 1 TABLET BY MOUTH 2 TIMES DAILY 04/04/20 04/04/21 Yes Almquist, Earlyne Iba, MD  labetalol (NORMODYNE) 200 MG tablet Take 1 tablet by mouth twice a day. 04/17/20  Yes   metoCLOPramide  (REGLAN) 10 MG tablet Take 1 tablet (10 mg total) by mouth every 8 (eight) hours as needed for nausea. 05/03/20  Yes Jorje Guild, NP  ondansetron (ZOFRAN-ODT) 4 MG disintegrating tablet Dissolve 1 tablet sublingually every 8 hours as needed 05/01/20  Yes   ondansetron (ZOFRAN-ODT) 4 MG disintegrating tablet Place 1 tablet under the tongue every 8 hours as needed. 05/09/20  Yes   Prenatal Vit-Fe Fumarate-FA (PRENATAL VITAMINS PO) Prenatal Vitamin   Yes [provider]  famotidine (PEPCID) 20 MG tablet Take 1 tablet (20 mg total) by mouth 2 (two) times daily. 04/23/20 05/23/20  Jorje Guild, NP  famotidine (PEPCID) 40 MG tablet Take 1 tablet (40 mg total) by mouth daily. 05/01/20     ondansetron (ZOFRAN-ODT) 4 MG disintegrating tablet Dissolve 1 tablet under the tongue every 8 hours as needed 05/09/20     valACYclovir (VALTREX) 500 MG tablet TAKE 1 TABLET BY MOUTH TWO TIMES DAILY FOR 3 DAYS AS NEEDED 07/07/19 07/06/20  Avon Gully, NP  fluticasone [facility]) 50 MCG/ACT nasal spray Place 2 sprays into both nostrils daily. 04/23/17 07/14/19  Tasia Catchings, Amy V, PA-C  ipratropium (ATROVENT) 0.06 % nasal spray Place 2 sprays into both nostrils 4 (four) times daily. 04/23/17 07/14/19  Tasia Catchings, Amy V, PA-C  sodium chloride (OCEAN) 0.65 % SOLN nasal spray Place 1 spray into both nostrils 3 (three) times daily as needed for congestion.  07/14/19  [provider]    Physical Exam: Vitals:   06/09/20 2206  BP: 115/70  Pulse: (!) 142  Temp: (!) 97.5 F (36.4 C)  TempSrc: Axillary  SpO2: 100%  Weight: 117.9 kg  Height: 5\' 7"  (1.702 m)    Constitutional: NAD, calm, comfortable Vitals:   06/09/20 2206  BP: 115/70  Pulse: (!) 142  Temp: (!) 97.5 F (36.4 C)  TempSrc: Axillary  SpO2: 100%  Weight: 117.9 kg  Height: 5\' 7"  (1.702 m)   General: WDWN, Alert and oriented x3.  Eyes: EOMI, PERRL, conjunctivae normal.  Sclera nonicteric HENT:  [place]/AT, external ears normal.  Nares patent without epistasis.   Mucous membranes are dry Neck: Soft, normal range of motion, supple, no masses, no thyromegaly. Trachea midline Respiratory: clear to auscultation bilaterally, no wheezing, no crackles. Normal respiratory effort. No accessory muscle use.  Cardiovascular: Regular rate and rhythm, no murmurs / rubs / gallops. No extremity edema. 2+ pedal pulses. Abdomen: Soft, Has right upper abdominal tenderness. Positive Murphy's sign. nondistended, no rebound or guarding. Morbidly obese. No masses palpated. Negative McBurney point tenderness. Bowel sounds normoactive Musculoskeletal: FROM. no cyanosis. No joint deformity upper and lower extremities. Normal muscle tone.  Skin: Warm, dry, intact no rashes, lesions, ulcers. No induration Neurologic: CN 2-12 grossly intact.  Normal speech.  Sensation intact. Strength 5/5 in all extremities.   Psychiatric: Normal judgment and insight.  Normal mood.    Labs on Admission: I have personally reviewed following labs and imaging studies  CBC: Recent Labs  Lab 06/09/20 2340  WBC 10.1  HGB 11.2*  HCT 32.7*  MCV 79.0*  PLT 591    Basic Metabolic  Panel: Recent Labs  Lab 06/09/20 2340  NA 124*  K 2.6*  CL 76*  CO2 27  GLUCOSE 165*  BUN 56*  CREATININE 2.58*  CALCIUM 9.5    GFR: Estimated Creatinine Clearance: 41.9 mL/min (A) (by C-G formula based on SCr of 2.58 mg/dL (H)).  Liver Function Tests: Recent Labs  Lab 06/09/20 2340  AST 286*  ALT 309*  ALKPHOS 67  BILITOT 3.1*  PROT 7.3  ALBUMIN 2.7*    Urine analysis:    Component Value Date/Time   COLORURINE AMBER (A) 04/25/2020 1301   APPEARANCEUR CLOUDY (A) 04/25/2020 1301   LABSPEC 1.025 04/25/2020 1301   PHURINE 6.0 04/25/2020 1301   GLUCOSEU NEGATIVE 04/25/2020 1301   HGBUR MODERATE (A) 04/25/2020 1301   HGBUR small 11/26/2009 1454   BILIRUBINUR NEGATIVE 04/25/2020 1301   KETONESUR 80 (A) 04/25/2020 1301   PROTEINUR 30 (A) 04/25/2020 1301   UROBILINOGEN 0.2 03/25/2020 1157    NITRITE NEGATIVE 04/25/2020 1301   LEUKOCYTESUR LARGE (A) 04/25/2020 1301    Radiological Exams on Admission: US OB Comp Less 14 Wks  Result Date: 06/10/2020 CLINICAL DATA:  Bleeding EXAM: OBSTETRIC <14 WK ULTRASOUND TECHNIQUE: Transabdominal ultrasound was performed for evaluation of the gestation as well as the maternal uterus and adnexal regions. COMPARISON:  None. FINDINGS: Intrauterine gestational sac: Single Yolk sac:  Not visualized Embryo:  Visualized Cardiac Activity: Visualized Heart Rate: 164 bpm CRL: 78 mm   13 w 6 d                  [place] EDC: 12/10/2020 Subchorionic hemorrhage:  None visualized. Maternal uterus/adnexae: Ovaries nonvisualized. Large exophytic mass at the uterine fundus measuring 13.4 x 9.3 x 14.9 cm. No free fluid IMPRESSION: 1. Single viable intrauterine pregnancy as above. 2. Large 14.9 cm exophytic fibroid off the uterine fundus. Otherwise no specific abnormality is seen. Electronically Signed   By: Donavan Foil M.D.   On: 06/10/2020 01:45   US Abdomen Limited RUQ (LIVER/GB)  Result Date: 06/10/2020 CLINICAL DATA:  Nausea and vomiting, right upper quadrant pain, [redacted] weeks pregnant, increased liver function tests EXAM: ULTRASOUND ABDOMEN LIMITED RIGHT UPPER QUADRANT COMPARISON:  None. FINDINGS: Gallbladder: Gallbladder is moderately distended, with gallbladder sludge layering dependently. No shadowing gallstones. There is borderline gallbladder wall thickening measuring 4 mm. No pericholecystic fluid. Negative sonographic Murphy sign. Common bile duct: Diameter: 3 mm Liver: No focal lesion identified. Within normal limits in parenchymal echogenicity. Portal vein is patent on color Doppler imaging with normal direction of blood flow towards the liver. Other: None. IMPRESSION: 1. Distended gallbladder, with internal echogenic sludge and borderline gallbladder wall thickening. No shadowing gallstones and negative sonographic Murphy sign. Findings are equivocal for acute  cholecystitis. Electronically Signed   By: Randa Ngo M.D.   On: 06/10/2020 03:36    Assessment/Plan Active Problems:   Acute gallstone pancreatitis Ms. Leasure is admitted to med telemetry. IVF hydration and antiemetics provided. Consult surgery for evaluation in the am.  Has thickened gallbladder with sludge on U/S. Has elevated lipase and LFTs.  AKI  IVF hydration with LR at 150 ml/hr overnight.  Recheck electrolytes and renal function on labs in am.   Hypokalemia Magnesium has been ordered and pending. Was given 20 mEq of KCL. Will await magnesium level before further repletion of potassium level. Monitor on telemetry.   Dehydration IVF hydration with LR  Hyperemesis gravidum Antiemetics provided. IVF hydration Has had weight loss over the past month.   Trichomonas infection  Will need treatment for trichomonas infection once vomiting is resolved.   IUP at 13 weeks Also has fibroid per [facility] Midwife which will be followed by OB/Gyn.      DVT prophylaxis: [place] score low. TED hose and early ambulation for DVT prophylaxis  Code Status:   Full Code  Family Communication:  Does not plan discussed with patient and her mother who is at bedside.  They verbalized understanding.  Questions answered.  Further recommendations to follow as clinically indicated Disposition Plan:   Patient is from:  Home  Anticipated DC to:  Home  Anticipated DC date:  A 2 midnight or more stay in the hospital to treat acute condition  Anticipated DC barriers: No barriers to discharge identified at this time  Admission status:  inpatient   Eben Burow MD [facility]  How to contact the [facility] Attending or Consulting provider [place] or covering provider during after hours [place], for this patient?   1. Check the care team in [facility] and look for a) attending/consulting [facility] provider listed and b) the [facility] team listed 2. Log into [email or web address] and use [place]'s universal password to  access. If you do not have the password, please contact the hospital operator. 3. Locate the [facility] provider you are looking for under [facility] and page to a number that you can be directly reached. 4. If you still have difficulty reaching the provider, please page the [facility] (Director on Call) for the Hospitalists listed on amion for assistance.  06/10/2020, 4:35 AM

## 2020-06-10 NOTE — Consult Note (Signed)
Impression/Recommendations: Intrauterine pregnancy at 13-6/7 weeks Will need to begin prenatal care, has appointment with Dr. Charlesetta Garibaldi at [facility], previously a patient of [facility] however they do not take Medicaid and she cannot go there for this pregnancy.  Hyperemesis gravidarum Per patient reports she weighed 310 pounds prior to pregnancy and has had significant nausea vomiting and weight loss. The patient was down to 260 pounds today IV fluid repletion, add multivitamin We will add scopolamine patch, continue Phenergan, Zofran as needed Discussed multi faceted approach to treatment Consider Medrol taper as needed Rule out hyperthyroidism  Pancreatitis Can certainly related to gallstones/sludge pancreatitis or could be related to hyperemesis ongoing.  Acute kidney injury Almost certainly related to dehydration.  Will need vigorous rehydration and monitoring.  Avoid nephrotoxic agents.  Hyponatremia Replete electrolytes  Hypokalemia Replete and trend  Abnormal LFTs Trend and hopefully will improve with hydration and control of emesis. Appreciate surgery's input and can consider GI if needed.  Given lack of fever and lack of elevated [place] count unlikely to be acute cholecystitis. There was some question about cholelithiasis however, this is not common in early pregnancy.  Gallbladder sludge Certainly may be contributing or may be there due to previous contraction dehydration state.  Hypertension May use labetalol if needed for blood pressure control with goal to have blood pressures less than 130/80. If tolerating may add aspirin to prevent preeclampsia in the third trimester.  Trichomonas Did receive IV Flagyl once but obviously this was not enough to treat.  Suspect 7 days of p.o. Flagyl will be better once we have her nausea better controlled.  Reason for consult: Patient is a 30 y.o. G1P0 female who was admitted with electrolyte derangement, hyperemesis,  pancreatitis to the medicine service line.  This is her first pregnancy and she has had ongoing nausea and emesis and has been to the ER on multiple occasions for hydration and antiemetics.  She had recently run out of the antiemetics that she had gotten while in the ED and had not yet started prenatal care so was unable to get refills.  She reports not being able to keep anything down and not having had a meal in the past 1 week.  Apparently family brought her to the hospital because she appeared so weak.  She did receive IV fluids and then maternity assessment unit and then electrolytes were so deranged it was felt she was best suited for admission.  Of note her lipase was elevated as were her LFTs and she had acute kidney injury.  A right upper quadrant ultrasound showed gallbladder sludge and there was a question of cholecystitis.  General surgery has been consulted.  We are asked to see the patient regarding pregnancy concerns and hyperemesis.   Past Medical History:  Diagnosis Date  . Chronic hypertension   . Genital herpes   . Infection    UTI  . Obesity   . Skin infection     Past Surgical History:  Procedure Laterality Date  . BREAST REDUCTION SURGERY      Family History  Problem Relation Age of Onset  . Diabetes Other   . Hypertension Other   . Healthy Mother     Social History   Socioeconomic History  . Marital status: Single    Spouse name: Not on file  . Number of children: Not on file  . Years of education: Not on file  . Highest education level: Not on file  Occupational History  . Not  on file  Tobacco Use  . Smoking status: Never Smoker  . Smokeless tobacco: Never Used  Vaping Use  . Vaping Use: Never used  Substance and Sexual Activity  . Alcohol use: No  . Drug use: No  . Sexual activity: Not Currently    Birth control/protection: None  Other Topics Concern  . Not on file  Social History Narrative  . Not on file   Social Determinants of Health    Financial Resource Strain: Not on file  Food Insecurity: Not on file  Transportation Needs: Not on file  Physical Activity: Not on file  Stress: Not on file  Social Connections: Not on file  Intimate Partner Violence: Not on file    . labetalol  100 mg Oral BID  . polyethylene glycol  17 g Oral BID  . potassium chloride  40 mEq Oral Q4H  . scopolamine  1 patch Transdermal Q72H    Allergies  Allergen Reactions  . Other     Cantaloupe: itching    Review of Systems - Negative except As per HPI  Exam Vitals:   06/10/20 0748 06/10/20 1221  BP: 116/61 123/61  Pulse: (!) 124 (!) 108  Resp: 14 16  Temp: 98.6 F (37 C) 98.5 F (36.9 C)  SpO2:  100%    Physical Examination: General appearance - overweight and chronically ill appearing Chest -normal effort Heart - normal rate and regular rhythm Abdomen -soft, minimally tender Neurological -nonfocal Extremities -symmetric Skin - normal coloration and turgor, no rashes, no suspicious skin lesions noted  Labs: Results for orders placed or performed during the hospital encounter of 06/09/20 (from the past 24 hour(s))  CBC     Status: Abnormal   Collection Time: 06/09/20 11:40 PM  Result Value Ref Range   WBC 10.1 4.0 - 10.5 K/uL   RBC 4.14 3.87 - 5.11 MIL/uL   Hemoglobin 11.2 (L) 12.0 - 15.0 g/dL   HCT 32.7 (L) 36.0 - 46.0 %   MCV 79.0 (L) 80.0 - 100.0 fL   MCH 27.1 26.0 - 34.0 pg   MCHC 34.3 30.0 - 36.0 g/dL   RDW 11.9 11.5 - 15.5 %   Platelets 256 150 - 400 K/uL   nRBC 0.0 0.0 - 0.2 %  Comprehensive metabolic panel     Status: Abnormal   Collection Time: 06/09/20 11:40 PM  Result Value Ref Range   Sodium 124 (L) 135 - 145 mmol/L   Potassium 2.6 (LL) 3.5 - 5.1 mmol/L   Chloride 76 (L) 98 - 111 mmol/L   CO2 27 22 - 32 mmol/L   Glucose, Bld 165 (H) 70 - 99 mg/dL   BUN 56 (H) 6 - 20 mg/dL   Creatinine, Ser 2.58 (H) 0.44 - 1.00 mg/dL   Calcium 9.5 8.9 - 10.3 mg/dL   Total Protein 7.3 6.5 - 8.1 g/dL   Albumin  2.7 (L) 3.5 - 5.0 g/dL   AST 286 (H) 15 - 41 U/L   ALT 309 (H) 0 - 44 U/L   Alkaline Phosphatase 67 38 - 126 U/L   Total Bilirubin 3.1 (H) 0.3 - 1.2 mg/dL   GFR, Estimated 25 (L) >60 mL/min   Anion gap 21 (H) 5 - 15  Lipase, blood     Status: Abnormal   Collection Time: 06/10/20  2:20 AM  Result Value Ref Range   Lipase 307 (H) 11 - 51 U/L  Magnesium     Status: None   Collection Time: 06/10/20  2:25 AM  Result Value Ref Range   Magnesium 2.1 1.7 - 2.4 mg/dL  Wet prep, genital     Status: Abnormal   Collection Time: 06/10/20  2:34 AM   Specimen: Genital  Result Value Ref Range   Yeast Wet Prep HPF POC NONE SEEN NONE SEEN   Trich, Wet Prep PRESENT (A) NONE SEEN   Clue Cells Wet Prep HPF POC PRESENT (A) NONE SEEN   WBC, Wet Prep HPF POC FEW (A) NONE SEEN   Sperm NONE SEEN   Urinalysis, Routine w reflex microscopic Urine, Clean Catch     Status: Abnormal   Collection Time: 06/10/20  6:09 AM  Result Value Ref Range   Color, Urine AMBER (A) YELLOW   APPearance CLOUDY (A) CLEAR   Specific Gravity, Urine 1.010 1.005 - 1.030   pH 5.0 5.0 - 8.0   Glucose, UA NEGATIVE NEGATIVE mg/dL   Hgb urine dipstick SMALL (A) NEGATIVE   Bilirubin Urine NEGATIVE NEGATIVE   Ketones, ur 5 (A) NEGATIVE mg/dL   Protein, ur 30 (A) NEGATIVE mg/dL   Nitrite NEGATIVE NEGATIVE   Leukocytes,Ua TRACE (A) NEGATIVE   RBC / HPF 0-5 0 - 5 RBC/hpf   WBC, UA 11-20 0 - 5 WBC/hpf   Bacteria, UA MANY (A) NONE SEEN   Squamous Epithelial / LPF 0-5 0 - 5   Mucus PRESENT    Trichomonas, UA PRESENT (A) NONE SEEN   Amorphous Crystal PRESENT   Resp Panel by RT-PCR (Flu A&B, Covid) Nasopharyngeal Swab     Status: None   Collection Time: 06/10/20  9:00 AM   Specimen: Nasopharyngeal Swab; Nasopharyngeal(NP) swabs in vial transport medium  Result Value Ref Range   SARS Coronavirus 2 by RT PCR NEGATIVE NEGATIVE   Influenza A by PCR NEGATIVE NEGATIVE   Influenza B by PCR NEGATIVE NEGATIVE  HIV Antibody (routine testing w  rflx)     Status: None   Collection Time: 06/10/20  9:03 AM  Result Value Ref Range   HIV Screen 4th Generation wRfx Non Reactive Non Reactive  Comprehensive metabolic panel     Status: Abnormal   Collection Time: 06/10/20  9:03 AM  Result Value Ref Range   Sodium 125 (L) 135 - 145 mmol/L   Potassium 2.9 (L) 3.5 - 5.1 mmol/L   Chloride 83 (L) 98 - 111 mmol/L   CO2 27 22 - 32 mmol/L   Glucose, Bld 136 (H) 70 - 99 mg/dL   BUN 53 (H) 6 - 20 mg/dL   Creatinine, Ser 2.31 (H) 0.44 - 1.00 mg/dL   Calcium 9.0 8.9 - 10.3 mg/dL   Total Protein 6.2 (L) 6.5 - 8.1 g/dL   Albumin 2.3 (L) 3.5 - 5.0 g/dL   AST 260 (H) 15 - 41 U/L   ALT 263 (H) 0 - 44 U/L   Alkaline Phosphatase 57 38 - 126 U/L   Total Bilirubin 2.6 (H) 0.3 - 1.2 mg/dL   GFR, Estimated 28 (L) >60 mL/min   Anion gap 15 5 - 15  CBC     Status: Abnormal   Collection Time: 06/10/20  9:03 AM  Result Value Ref Range   WBC 7.8 4.0 - 10.5 K/uL   RBC 3.62 (L) 3.87 - 5.11 MIL/uL   Hemoglobin 9.9 (L) 12.0 - 15.0 g/dL   HCT 28.9 (L) 36.0 - 46.0 %   MCV 79.8 (L) 80.0 - 100.0 fL   MCH 27.3 26.0 - 34.0 pg   MCHC 34.3  30.0 - 36.0 g/dL   RDW 12.1 11.5 - 15.5 %   Platelets 218 150 - 400 K/uL   nRBC 0.0 0.0 - 0.2 %    Radiological Studies US OB Comp Less 14 Wks  Result Date: 06/10/2020 CLINICAL DATA:  Bleeding EXAM: OBSTETRIC <14 WK ULTRASOUND TECHNIQUE: Transabdominal ultrasound was performed for evaluation of the gestation as well as the maternal uterus and adnexal regions. COMPARISON:  None. FINDINGS: Intrauterine gestational sac: Single Yolk sac:  Not visualized Embryo:  Visualized Cardiac Activity: Visualized Heart Rate: 164 bpm CRL: 78 mm   13 w 6 d                  [place] EDC: 12/10/2020 Subchorionic hemorrhage:  None visualized. Maternal uterus/adnexae: Ovaries nonvisualized. Large exophytic mass at the uterine fundus measuring 13.4 x 9.3 x 14.9 cm. No free fluid IMPRESSION: 1. Single viable intrauterine pregnancy as above. 2. Large 14.9 cm  exophytic fibroid off the uterine fundus. Otherwise no specific abnormality is seen. Electronically Signed   By: Donavan Foil M.D.   On: 06/10/2020 01:45   US Abdomen Limited RUQ (LIVER/GB)  Result Date: 06/10/2020 CLINICAL DATA:  Nausea and vomiting, right upper quadrant pain, [redacted] weeks pregnant, increased liver function tests EXAM: ULTRASOUND ABDOMEN LIMITED RIGHT UPPER QUADRANT COMPARISON:  None. FINDINGS: Gallbladder: Gallbladder is moderately distended, with gallbladder sludge layering dependently. No shadowing gallstones. There is borderline gallbladder wall thickening measuring 4 mm. No pericholecystic fluid. Negative sonographic Murphy sign. Common bile duct: Diameter: 3 mm Liver: No focal lesion identified. Within normal limits in parenchymal echogenicity. Portal vein is patent on color Doppler imaging with normal direction of blood flow towards the liver. Other: None. IMPRESSION: 1. Distended gallbladder, with internal echogenic sludge and borderline gallbladder wall thickening. No shadowing gallstones and negative sonographic Murphy sign. Findings are equivocal for acute cholecystitis. Electronically Signed   By: Randa Ngo M.D.   On: 06/10/2020 03:36    Thank you so much for allowing [place] to participate in the care of this patient.  We will continue to follow with you. Please call the attending OB/GYN physician with questions or concerns at [phone] M-F 8a-5p, after hours and on weekend, we can be reached at [phone].

## 2020-06-10 NOTE — Consult Note (Signed)
Stacy Fowler Oct 28, 1990  [id number].    Requesting MD: Dr. Enzo Bi Chief Complaint/Reason for Consult: gallstone pancreatitis  HPI:  This is a pleasant, morbidly obese black female who is currently about [redacted] weeks pregnant.  She has had a very difficult pregnancy thus far and is currently out on FMLA secondary to severe nausea and vomiting.  She had an OB/GYN but has apparently left this practice and is currently not under the care of anyone.  She states for the last at least a month or so she has been having weakness and fatigue along with persistent nausea, vomiting, inability to take in nutrition.  She states that she is drinking about 6 cups of water a day.  She states that 2 weeks ago she developed pelvic pressure and pain.  She denies any vaginal discharge.  She denies any upper abdominal symptoms.  Her mother and grandmother began to notice how severely weak she was yesterday and decided to bring her to the emergency department she did not have any abrupt changes in abdominal pain or new symptoms that prompted this.  Upon arrival the patient was noted to have acute kidney injury with a creatinine of 2.56, hyponatremia, hypokalemia, as well as elevation in her AST, ALT, and total bilirubin.  Her lipase was noted to be slightly elevated at 307.  She also has been found to have trichomonas as well as a urinary tract infection.  Her Maertens blood cell count is normal.  She underwent an abdominal ultrasound which reveals a moderately distended gallbladder with sludge but no definitive gallstones were noted.  There was borderline wall thickening at 4 mm but a negative sonographic Murphy sign.  Given her lipase as well as her ultrasound there was concern for possible gallstone pancreatitis.  We have been asked to evaluate the patient for further evaluation and recommendations.  ROS: ROS: Please see HPI, otherwise all other systems reviewed and are negative.  Family History  Problem Relation Age of  Onset  . Diabetes Other   . Hypertension Other   . Healthy Mother     Past Medical History:  Diagnosis Date  . Chronic hypertension   . Genital herpes   . Infection    UTI  . Obesity   . Skin infection     Past Surgical History:  Procedure Laterality Date  . BREAST REDUCTION SURGERY      Social History:  reports that she has never smoked. She has never used smokeless tobacco. She reports that she does not drink alcohol and does not use drugs.  Allergies:  Allergies  Allergen Reactions  . Other     Cantaloupe: itching    Medications Prior to Admission  Medication Sig Dispense Refill  . labetalol (NORMODYNE) 200 MG tablet Take 1 tablet by mouth twice a day. (Patient taking differently: Take 200 mg by mouth 2 (two) times daily.) 60 tablet 1  . metoCLOPramide (REGLAN) 10 MG tablet Take 1 tablet (10 mg total) by mouth every 8 (eight) hours as needed for nausea. 30 tablet 0  . ondansetron (ZOFRAN-ODT) 4 MG disintegrating tablet Dissolve 1 tablet sublingually every 8 hours as needed (Patient taking differently: Take 4 mg by mouth every 8 (eight) hours as needed for nausea.) 24 tablet 1  . Prenatal Vit-Fe Fumarate-FA (PRENATAL VITAMINS PO) Take 1 tablet by mouth daily.    . famotidine (PEPCID) 20 MG tablet Take 1 tablet (20 mg total) by mouth 2 (two) times daily. 60 tablet 0  .  famotidine (PEPCID) 40 MG tablet Take 1 tablet (40 mg total) by mouth daily. (Patient not taking: Reported on 06/10/2020) 90 tablet 0  . labetalol (NORMODYNE) 100 MG tablet TAKE 1 TABLET BY MOUTH 2 TIMES DAILY (Patient not taking: Reported on 06/10/2020) 60 tablet 0  . ondansetron (ZOFRAN-ODT) 4 MG disintegrating tablet Place 1 tablet under the tongue every 8 hours as needed. (Patient not taking: Reported on 06/10/2020) 24 tablet 1  . ondansetron (ZOFRAN-ODT) 4 MG disintegrating tablet Dissolve 1 tablet under the tongue every 8 hours as needed (Patient not taking: Reported on 06/10/2020) 24 tablet 1      Physical Exam: Blood pressure 123/61, pulse (!) 108, temperature 98.5 F (36.9 C), temperature source Oral, resp. rate 16, height 5\' 7"  (1.702 m), weight 117.9 kg, last menstrual period 03/05/2020, SpO2 100 %. General: pleasant, morbidly obese black female who is laying in bed in NAD HEENT: head is normocephalic, atraumatic.  Sclera are noninjected.  PERRL.  Ears and nose without any masses or lesions.  Mouth is pink and moist Heart: regular but tachy.  Normal s1,s2. No obvious murmurs, gallops, or rubs noted.  Palpable radial and pedal pulses bilaterally Lungs: CTAB, no wheezes, rhonchi, or rales noted.  Respiratory effort nonlabored Abd: soft, minimally tender in RUQ, but no definitive murphy's sign, greatest tenderness in lower abdomen centrally in pelvis, ND/obese, +BS, no masses, hernias, or organomegaly MS: all 4 extremities are symmetrical with no cyanosis, clubbing, or edema. Skin: warm and dry with no masses, lesions, or rashes Neuro: Cranial nerves 2-12 grossly intact, sensation is normal throughout Psych: A&Ox3 with an appropriate affect.   Results for orders placed or performed during the hospital encounter of 06/09/20 (from the past 48 hour(s))  CBC     Status: Abnormal   Collection Time: 06/09/20 11:40 PM  Result Value Ref Range   WBC 10.1 4.0 - 10.5 K/uL   RBC 4.14 3.87 - 5.11 MIL/uL   Hemoglobin 11.2 (L) 12.0 - 15.0 g/dL   HCT 32.7 (L) 36.0 - 46.0 %   MCV 79.0 (L) 80.0 - 100.0 fL   MCH 27.1 26.0 - 34.0 pg   MCHC 34.3 30.0 - 36.0 g/dL   RDW 11.9 11.5 - 15.5 %   Platelets 256 150 - 400 K/uL   nRBC 0.0 0.0 - 0.2 %    Comment: Performed at [facility], [place] [address], [place], [place] [address]  Comprehensive metabolic panel     Status: Abnormal   Collection Time: 06/09/20 11:40 PM  Result Value Ref Range   Sodium 124 (L) 135 - 145 mmol/L   Potassium 2.6 (LL) 3.5 - 5.1 mmol/L    Comment: CRITICAL RESULT CALLED TO, READ BACK BY AND VERIFIED WITH: D  MARTIN,RN 06/10/2020 0105 WILDERK    Chloride 76 (L) 98 - 111 mmol/L   CO2 27 22 - 32 mmol/L   Glucose, Bld 165 (H) 70 - 99 mg/dL    Comment: Glucose reference range applies only to samples taken after fasting for at least 8 hours.   BUN 56 (H) 6 - 20 mg/dL   Creatinine, Ser 2.58 (H) 0.44 - 1.00 mg/dL   Calcium 9.5 8.9 - 10.3 mg/dL   Total Protein 7.3 6.5 - 8.1 g/dL   Albumin 2.7 (L) 3.5 - 5.0 g/dL   AST 286 (H) 15 - 41 U/L   ALT 309 (H) 0 - 44 U/L   Alkaline Phosphatase 67 38 - 126 U/L   Total Bilirubin  3.1 (H) 0.3 - 1.2 mg/dL   GFR, Estimated 25 (L) >60 mL/min    Comment: (NOTE) Calculated using the CKD-EPI Creatinine Equation (2021)    Anion gap 21 (H) 5 - 15    Comment: Performed at [facility] [address] [place], [place] [address]  Lipase, blood     Status: Abnormal   Collection Time: 06/10/20  2:20 AM  Result Value Ref Range   Lipase 307 (H) 11 - 51 U/L    Comment: Performed at [facility], [place] [address], [place], [place] [address]  Magnesium     Status: None   Collection Time: 06/10/20  2:25 AM  Result Value Ref Range   Magnesium 2.1 1.7 - 2.4 mg/dL    Comment: Performed at [facility] [address], [place], [place] [address]  Wet prep, genital     Status: Abnormal   Collection Time: 06/10/20  2:34 AM   Specimen: Genital  Result Value Ref Range   Yeast Wet Prep HPF POC NONE SEEN NONE SEEN   Trich, Wet Prep PRESENT (A) NONE SEEN   Clue Cells Wet Prep HPF POC PRESENT (A) NONE SEEN   WBC, Wet Prep HPF POC FEW (A) NONE SEEN   Sperm NONE SEEN     Comment: Performed at [facility], [place] [address], [place], [place] [address]  Urinalysis, Routine w reflex microscopic Urine, Clean Catch     Status: Abnormal   Collection Time: 06/10/20  6:09 AM  Result Value Ref Range   Color, Urine AMBER (A) YELLOW    Comment: BIOCHEMICALS MAY BE AFFECTED BY COLOR   APPearance CLOUDY (A) CLEAR   Specific Gravity, Urine 1.010 1.005 - 1.030   pH 5.0  5.0 - 8.0   Glucose, UA NEGATIVE NEGATIVE mg/dL   Hgb urine dipstick SMALL (A) NEGATIVE   Bilirubin Urine NEGATIVE NEGATIVE   Ketones, ur 5 (A) NEGATIVE mg/dL   Protein, ur 30 (A) NEGATIVE mg/dL   Nitrite NEGATIVE NEGATIVE   Leukocytes,Ua TRACE (A) NEGATIVE   RBC / HPF 0-5 0 - 5 RBC/hpf   WBC, UA 11-20 0 - 5 WBC/hpf   Bacteria, UA MANY (A) NONE SEEN   Squamous Epithelial / LPF 0-5 0 - 5   Mucus PRESENT    Trichomonas, UA PRESENT (A) NONE SEEN   Amorphous Crystal PRESENT     Comment: Performed at [facility], [address], [place], [place] [address]  Resp Panel by RT-PCR (Flu A&B, Covid) Nasopharyngeal Swab     Status: None   Collection Time: 06/10/20  9:00 AM   Specimen: Nasopharyngeal Swab; Nasopharyngeal(NP) swabs in vial transport medium  Result Value Ref Range   SARS Coronavirus 2 by RT PCR NEGATIVE NEGATIVE    Comment: (NOTE) SARS-CoV-2 target nucleic acids are NOT DETECTED.  The SARS-CoV-2 RNA is generally detectable in upper respiratory specimens during the acute phase of infection. The lowest concentration of SARS-CoV-2 viral copies this assay can detect is 138 copies/mL. A negative result does not preclude SARS-Cov-2 infection and should not be used as the sole basis for treatment or other patient management decisions. A negative result may occur with  improper specimen collection/handling, submission of specimen other than nasopharyngeal swab, presence of viral mutation(s) within the areas targeted by this assay, and inadequate number of viral copies(<138 copies/mL). A negative result must be combined with clinical observations, patient history, and epidemiological information. The expected result is Negative.  Fact Sheet for Patients:  EntrepreneurPulse.com.au  Fact Sheet for Healthcare Providers:  IncredibleEmployment.be  This test is no t yet approved or cleared by the [place] FDA and  has been authorized for  detection and/or diagnosis of SARS-CoV-2 by FDA under an Emergency Use Authorization (EUA). This EUA will remain  in effect (meaning this test can be used) for the duration of the COVID-19 declaration under Section 564(b)(1) of the Act, 21 U.S.C.section 360bbb-3(b)(1), unless the authorization is terminated  or revoked sooner.       Influenza A by PCR NEGATIVE NEGATIVE   Influenza B by PCR NEGATIVE NEGATIVE    Comment: (NOTE) The Xpert Xpress SARS-CoV-2/FLU/RSV plus assay is intended as an aid in the diagnosis of influenza from Nasopharyngeal swab specimens and should not be used as a sole basis for treatment. Nasal washings and aspirates are unacceptable for Xpert Xpress SARS-CoV-2/FLU/RSV testing.  Fact Sheet for Patients: EntrepreneurPulse.com.au  Fact Sheet for Healthcare Providers: IncredibleEmployment.be  This test is not yet approved or cleared by the [place] FDA and has been authorized for detection and/or diagnosis of SARS-CoV-2 by FDA under an Emergency Use Authorization (EUA). This EUA will remain in effect (meaning this test can be used) for the duration of the COVID-19 declaration under Section 564(b)(1) of the Act, 21 U.S.C. section 360bbb-3(b)(1), unless the authorization is terminated or revoked.  Performed at [facility], [place] [address] [place], [place] [address]   Comprehensive metabolic panel     Status: Abnormal   Collection Time: 06/10/20  9:03 AM  Result Value Ref Range   Sodium 125 (L) 135 - 145 mmol/L   Potassium 2.9 (L) 3.5 - 5.1 mmol/L   Chloride 83 (L) 98 - 111 mmol/L   CO2 27 22 - 32 mmol/L   Glucose, Bld 136 (H) 70 - 99 mg/dL    Comment: Glucose reference range applies only to samples taken after fasting for at least 8 hours.   BUN 53 (H) 6 - 20 mg/dL   Creatinine, Ser 2.31 (H) 0.44 - 1.00 mg/dL   Calcium 9.0 8.9 - 10.3 mg/dL   Total Protein 6.2 (L) 6.5 - 8.1 g/dL   Albumin 2.3 (L) 3.5 - 5.0  g/dL   AST 260 (H) 15 - 41 U/L   ALT 263 (H) 0 - 44 U/L   Alkaline Phosphatase 57 38 - 126 U/L   Total Bilirubin 2.6 (H) 0.3 - 1.2 mg/dL   GFR, Estimated 28 (L) >60 mL/min    Comment: (NOTE) Calculated using the CKD-EPI Creatinine Equation (2021)    Anion gap 15 5 - 15    Comment: Performed at [facility] [address], [place], [place] [address]  CBC     Status: Abnormal   Collection Time: 06/10/20  9:03 AM  Result Value Ref Range   WBC 7.8 4.0 - 10.5 K/uL   RBC 3.62 (L) 3.87 - 5.11 MIL/uL   Hemoglobin 9.9 (L) 12.0 - 15.0 g/dL   HCT 28.9 (L) 36.0 - 46.0 %   MCV 79.8 (L) 80.0 - 100.0 fL   MCH 27.3 26.0 - 34.0 pg   MCHC 34.3 30.0 - 36.0 g/dL   RDW 12.1 11.5 - 15.5 %   Platelets 218 150 - 400 K/uL   nRBC 0.0 0.0 - 0.2 %    Comment: Performed at [facility], [place] [address], [place], [place] [address]   [place] Comp Less 14 Wks  Result Date:  06/10/2020 CLINICAL DATA:  Bleeding EXAM: OBSTETRIC <14 WK ULTRASOUND TECHNIQUE: Transabdominal ultrasound was performed for evaluation of the gestation as well as the maternal uterus and adnexal regions. COMPARISON:  None. FINDINGS: Intrauterine gestational sac: Single Yolk sac:  Not visualized Embryo:  Visualized Cardiac Activity: Visualized Heart Rate: 164 bpm CRL: 78 mm   13 w 6 d                  [place] EDC: 12/10/2020 Subchorionic hemorrhage:  None visualized. Maternal uterus/adnexae: Ovaries nonvisualized. Large exophytic mass at the uterine fundus measuring 13.4 x 9.3 x 14.9 cm. No free fluid IMPRESSION: 1. Single viable intrauterine pregnancy as above. 2. Large 14.9 cm exophytic fibroid off the uterine fundus. Otherwise no specific abnormality is seen. Electronically Signed   By: Donavan Foil M.D.   On: 06/10/2020 01:45   US Abdomen Limited RUQ (LIVER/GB)  Result Date: 06/10/2020 CLINICAL DATA:  Nausea and vomiting, right upper quadrant pain, [redacted] weeks pregnant, increased liver function tests EXAM: ULTRASOUND ABDOMEN LIMITED RIGHT  UPPER QUADRANT COMPARISON:  None. FINDINGS: Gallbladder: Gallbladder is moderately distended, with gallbladder sludge layering dependently. No shadowing gallstones. There is borderline gallbladder wall thickening measuring 4 mm. No pericholecystic fluid. Negative sonographic Murphy sign. Common bile duct: Diameter: 3 mm Liver: No focal lesion identified. Within normal limits in parenchymal echogenicity. Portal vein is patent on color Doppler imaging with normal direction of blood flow towards the liver. Other: None. IMPRESSION: 1. Distended gallbladder, with internal echogenic sludge and borderline gallbladder wall thickening. No shadowing gallstones and negative sonographic Murphy sign. Findings are equivocal for acute cholecystitis. Electronically Signed   By: Randa Ngo M.D.   On: 06/10/2020 03:36      Assessment/Plan 13W Intrauterine pregnancy with suspected hyperemesis gravidarum Dehydration with AKI Electrolyte derangement - replace per medicine Trichomonas infection - per medicine UTI - per medicine  Elevated lipase with elevated LFTs and biliary sludge  The patient's story is consistent with her difficult pregnancy and her hyperemesis gravidarum.  She does note fullness, pressure, pain in her pelvis over the last 2 weeks but denies upper abdominal pain.  She had no acute event of sudden onset epigastric abdominal pain or right upper quadrant abdominal pain consistent with pancreatitis.  She does however have a slight elevation of her lipase at 300 as well as some elevation of her AST, ALT, and total bilirubin.  Her ultrasound does not reveal any definitive gallstones but does reveal sludge which could potentially cause pancreatitis.  It is possible that she is having elevation secondary to cholestasis from her pregnancy along with nausea and vomiting which can cause the gallbladder to be distended as well.  It is not definitively clear to me at this time that the patient definitely has  "gallstone pancreatitis."  We will continue to follow her labs as well as her abdominal exam.  With all of that being said, if the patient does have this secondary to sludge it is not generally recommended to leave the gallbladder as there is a risk that this could recur and be worse.  We will see how the patient progresses over the next couple of days and talk with the oncoming surgeon about how we want to proceed.   FEN - CLD VTE - ok from surgical standpoint ID - none currently warranted for her gallbladder, defer for trich and UTI   Henreitta Cea, [facility] Surgery 06/10/2020, 12:41 PM Please see Amion for pager number during day hours  7:00am-4:30pm or 7:00am -11:30am on weekends

## 2020-06-11 DIAGNOSIS — O210 Mild hyperemesis gravidarum: Secondary | ICD-10-CM | POA: Diagnosis not present

## 2020-06-11 DIAGNOSIS — O26611 Liver and biliary tract disorders in pregnancy, first trimester: Secondary | ICD-10-CM | POA: Diagnosis not present

## 2020-06-11 DIAGNOSIS — R945 Abnormal results of liver function studies: Secondary | ICD-10-CM

## 2020-06-11 DIAGNOSIS — E876 Hypokalemia: Secondary | ICD-10-CM

## 2020-06-11 DIAGNOSIS — K851 Biliary acute pancreatitis without necrosis or infection: Secondary | ICD-10-CM | POA: Diagnosis not present

## 2020-06-11 DIAGNOSIS — I10 Essential (primary) hypertension: Secondary | ICD-10-CM | POA: Diagnosis not present

## 2020-06-11 DIAGNOSIS — N179 Acute kidney failure, unspecified: Secondary | ICD-10-CM

## 2020-06-11 DIAGNOSIS — E871 Hypo-osmolality and hyponatremia: Secondary | ICD-10-CM

## 2020-06-11 DIAGNOSIS — R7989 Other specified abnormal findings of blood chemistry: Secondary | ICD-10-CM

## 2020-06-11 DIAGNOSIS — O10911 Unspecified pre-existing hypertension complicating pregnancy, first trimester: Secondary | ICD-10-CM | POA: Diagnosis not present

## 2020-06-11 DIAGNOSIS — E0590 Thyrotoxicosis, unspecified without thyrotoxic crisis or storm: Secondary | ICD-10-CM

## 2020-06-11 LAB — COMPREHENSIVE METABOLIC PANEL
ALT: 210 U/L — ABNORMAL HIGH (ref 0–44)
AST: 202 U/L — ABNORMAL HIGH (ref 15–41)
Albumin: 2.2 g/dL — ABNORMAL LOW (ref 3.5–5.0)
Alkaline Phosphatase: 57 U/L (ref 38–126)
Anion gap: 13 (ref 5–15)
BUN: 47 mg/dL — ABNORMAL HIGH (ref 6–20)
CO2: 26 mmol/L (ref 22–32)
Calcium: 8.8 mg/dL — ABNORMAL LOW (ref 8.9–10.3)
Chloride: 90 mmol/L — ABNORMAL LOW (ref 98–111)
Creatinine, Ser: 1.94 mg/dL — ABNORMAL HIGH (ref 0.44–1.00)
GFR, Estimated: 35 mL/min — ABNORMAL LOW (ref >60)
Glucose, Bld: 85 mg/dL (ref 70–99)
Potassium: 2.9 mmol/L — ABNORMAL LOW (ref 3.5–5.1)
Sodium: 129 mmol/L — ABNORMAL LOW (ref 135–145)
Total Bilirubin: 1.6 mg/dL — ABNORMAL HIGH (ref 0.3–1.2)
Total Protein: 5.4 g/dL — ABNORMAL LOW (ref 6.5–8.1)

## 2020-06-11 LAB — CBC
HCT: 26.6 % — ABNORMAL LOW (ref 36.0–46.0)
Hemoglobin: 8.9 g/dL — ABNORMAL LOW (ref 12.0–15.0)
MCH: 27.1 pg (ref 26.0–34.0)
MCHC: 33.5 g/dL (ref 30.0–36.0)
MCV: 81.1 fL (ref 80.0–100.0)
Platelets: 180 10*3/uL (ref 150–400)
RBC: 3.28 MIL/uL — ABNORMAL LOW (ref 3.87–5.11)
RDW: 12.1 % (ref 11.5–15.5)
WBC: 6.9 10*3/uL (ref 4.0–10.5)
nRBC: 0 % (ref 0.0–0.2)

## 2020-06-11 LAB — MAGNESIUM: Magnesium: 1.7 mg/dL (ref 1.7–2.4)

## 2020-06-11 LAB — T3, FREE: T3, Free: 8.8 pg/mL — ABNORMAL HIGH (ref 2.0–4.4)

## 2020-06-11 LAB — LIPASE, BLOOD: Lipase: 294 U/L — ABNORMAL HIGH (ref 11–51)

## 2020-06-11 MED ORDER — ONDANSETRON HCL 4 MG/2ML IJ SOLN
4.0000 mg | INTRAMUSCULAR | Status: DC | PRN
  Administered 2020-06-14 – 2020-06-15 (×3): 4 mg via INTRAVENOUS
  Filled 2020-06-11 (×3): qty 2

## 2020-06-11 MED ORDER — POLYETHYLENE GLYCOL 3350 17 G PO PACK
34.0000 g | PACK | ORAL | Status: AC
  Administered 2020-06-11 (×4): 34 g via ORAL
  Filled 2020-06-11 (×5): qty 2

## 2020-06-11 MED ORDER — POTASSIUM CHLORIDE 20 MEQ PO PACK
20.0000 meq | PACK | Freq: Two times a day (BID) | ORAL | Status: AC
  Administered 2020-06-11 – 2020-06-12 (×3): 20 meq via ORAL
  Filled 2020-06-11 (×3): qty 1

## 2020-06-11 MED ORDER — ASPIRIN 81 MG PO CHEW
81.0000 mg | CHEWABLE_TABLET | Freq: Every day | ORAL | Status: DC
  Administered 2020-06-11 – 2020-06-15 (×5): 81 mg via ORAL
  Filled 2020-06-11 (×6): qty 1

## 2020-06-11 MED ORDER — METHIMAZOLE 5 MG PO TABS
5.0000 mg | ORAL_TABLET | Freq: Three times a day (TID) | ORAL | Status: DC
  Administered 2020-06-11 – 2020-06-15 (×12): 5 mg via ORAL
  Filled 2020-06-11 (×15): qty 1

## 2020-06-11 MED ORDER — MAGNESIUM SULFATE 2 GM/50ML IV SOLN
2.0000 g | Freq: Once | INTRAVENOUS | Status: AC
  Administered 2020-06-11: 2 g via INTRAVENOUS
  Filled 2020-06-11: qty 50

## 2020-06-11 MED ORDER — POTASSIUM CHLORIDE 10 MEQ/100ML IV SOLN
10.0000 meq | INTRAVENOUS | Status: AC
  Administered 2020-06-11 (×4): 10 meq via INTRAVENOUS
  Filled 2020-06-11 (×3): qty 100

## 2020-06-11 NOTE — Progress Notes (Signed)
Subjective/Chief Complaint: Feels better.  No emesis Able to keep salad down +flatus Some Lower abd pain (points to suprapubic area) and Right sided lateral upper abd pain   Objective: Vital signs in last 24 hours: Temp:  [97.7 F (36.5 C)-98.5 F (36.9 C)] 98.4 F (36.9 C) (05/30 0553) Pulse Rate:  [97-110] 97 (05/30 0553) Resp:  [16-18] 17 (05/30 0553) BP: (107-123)/(58-61) 107/58 (05/30 0553) SpO2:  [98 %-100 %] 98 % (05/30 0553)    Intake/Output from previous day: 05/29 0701 - 05/30 0700 In: 1260 [P.O.:960; I.V.:150; IV Piggyback:150] Out: -  Intake/Output this shift: No intake/output data recorded.  Nad, nontoxic Obese, soft, min TTP right lateral upper abd   Lab Results:  Recent Labs    06/10/20 0903 06/11/20 0032  WBC 7.8 6.9  HGB 9.9* 8.9*  HCT 28.9* 26.6*  PLT 218 180   BMET Recent Labs    06/10/20 0903 06/11/20 0032  NA 125* 129*  K 2.9* 2.9*  CL 83* 90*  CO2 27 26  GLUCOSE 136* 85  BUN 53* 47*  CREATININE 2.31* 1.94*  CALCIUM 9.0 8.8*   PT/INR No results for input(s): LABPROT, INR in the last 72 hours. ABG No results for input(s): PHART, HCO3 in the last 72 hours.  Invalid input(s): PCO2, PO2  Studies/Results: US OB Comp Less 14 Wks  Result Date: 06/10/2020 CLINICAL DATA:  Bleeding EXAM: OBSTETRIC <14 WK ULTRASOUND TECHNIQUE: Transabdominal ultrasound was performed for evaluation of the gestation as well as the maternal uterus and adnexal regions. COMPARISON:  None. FINDINGS: Intrauterine gestational sac: Single Yolk sac:  Not visualized Embryo:  Visualized Cardiac Activity: Visualized Heart Rate: 164 bpm CRL: 78 mm   13 w 6 d                  [place] EDC: 12/10/2020 Subchorionic hemorrhage:  None visualized. Maternal uterus/adnexae: Ovaries nonvisualized. Large exophytic mass at the uterine fundus measuring 13.4 x 9.3 x 14.9 cm. No free fluid IMPRESSION: 1. Single viable intrauterine pregnancy as above. 2. Large 14.9 cm exophytic fibroid  off the uterine fundus. Otherwise no specific abnormality is seen. Electronically Signed   By: Donavan Foil M.D.   On: 06/10/2020 01:45   US Abdomen Limited RUQ (LIVER/GB)  Result Date: 06/10/2020 CLINICAL DATA:  Nausea and vomiting, right upper quadrant pain, [redacted] weeks pregnant, increased liver function tests EXAM: ULTRASOUND ABDOMEN LIMITED RIGHT UPPER QUADRANT COMPARISON:  None. FINDINGS: Gallbladder: Gallbladder is moderately distended, with gallbladder sludge layering dependently. No shadowing gallstones. There is borderline gallbladder wall thickening measuring 4 mm. No pericholecystic fluid. Negative sonographic Murphy sign. Common bile duct: Diameter: 3 mm Liver: No focal lesion identified. Within normal limits in parenchymal echogenicity. Portal vein is patent on color Doppler imaging with normal direction of blood flow towards the liver. Other: None. IMPRESSION: 1. Distended gallbladder, with internal echogenic sludge and borderline gallbladder wall thickening. No shadowing gallstones and negative sonographic Murphy sign. Findings are equivocal for acute cholecystitis. Electronically Signed   By: Randa Ngo M.D.   On: 06/10/2020 03:36    Anti-infectives: Anti-infectives (From admission, onward)   None      Assessment/Plan: 13W Intrauterine pregnancy with suspected hyperemesis gravidarum Dehydration with AKI Electrolyte derangement - replace per medicine Trichomonas infection - per medicine UTI - per medicine Anemia Abnormal thyroid function - tsh 0.010  Elevated lipase with elevated LFTs and biliary sludge  -equivocal signs of cholecystitis on u/s, no GS, +sludge/boderline wall thickening. no fever. No wbc  but does have right sided tenderness -lfts improving -lipase improving  -cont to trend LFTs -cont diet as tolerated -will follow  Leighton Ruff. Redmond Pulling, MD, FACS General, Bariatric, & Minimally Invasive Surgery [facility] Surgery, [place]   LOS: 1 day    Greer Pickerel 06/11/2020

## 2020-06-11 NOTE — Progress Notes (Signed)
Patient ID: Stacy Fowler, female   DOB: February 24, 1990, 30 y.o.   MRN: [id number]   Assessment/Plan: Active Problems:   HYPERTENSION, BENIGN ESSENTIAL   Acute gallstone pancreatitis   Hyperthyroidism   Hypokalemia   Hyponatremia   Abnormal LFTs   AKI (acute kidney injury) ([place])  Improving labs including serum Cr and LFTs Electrolytes are slightly better Continue IV hydration Potassium and other electrolyte repletion Will begin methimazole for thyroid Labetalol for BP and begin ASA  Subjective: Interval History: symptoms are improving. Tolerating some po.  Objective: Vital signs in last 24 hours: Temp:  [97.7 F (36.5 C)-98.6 F (37 C)] 98.4 F (36.9 C) (05/30 0553) Pulse Rate:  [97-124] 97 (05/30 0553) Resp:  [14-18] 17 (05/30 0553) BP: (107-123)/(58-61) 107/58 (05/30 0553) SpO2:  [98 %-100 %] 98 % (05/30 0553)  Intake/Output from previous day: 05/29 0701 - 05/30 0700 In: 1260 [P.O.:960; I.V.:150] Out: -  Intake/Output this shift: No intake/output data recorded.  General appearance: alert, morbidly obese and ill-appearing Head: Normocephalic, without obvious abnormality, atraumatic Neck: supple, symmetrical, trachea midline Lungs: normal effort Heart: regular rate and rhythm Abdomen: soft, minimally tender Extremities: Homans sign is negative, no sign of DVT Skin: Skin color, texture, turgor normal. No rashes or lesions Neurologic: Grossly normal  Results for orders placed or performed during the hospital encounter of 06/09/20 (from the past 24 hour(s))  Resp Panel by RT-PCR (Flu A&B, Covid) Nasopharyngeal Swab     Status: None   Collection Time: 06/10/20  9:00 AM   Specimen: Nasopharyngeal Swab; Nasopharyngeal(NP) swabs in vial transport medium  Result Value Ref Range   SARS Coronavirus 2 by RT PCR NEGATIVE NEGATIVE   Influenza A by PCR NEGATIVE NEGATIVE   Influenza B by PCR NEGATIVE NEGATIVE  HIV Antibody (routine testing w rflx)     Status: None   Collection  Time: 06/10/20  9:03 AM  Result Value Ref Range   HIV Screen 4th Generation wRfx Non Reactive Non Reactive  Comprehensive metabolic panel     Status: Abnormal   Collection Time: 06/10/20  9:03 AM  Result Value Ref Range   Sodium 125 (L) 135 - 145 mmol/L   Potassium 2.9 (L) 3.5 - 5.1 mmol/L   Chloride 83 (L) 98 - 111 mmol/L   CO2 27 22 - 32 mmol/L   Glucose, Bld 136 (H) 70 - 99 mg/dL   BUN 53 (H) 6 - 20 mg/dL   Creatinine, Ser 2.31 (H) 0.44 - 1.00 mg/dL   Calcium 9.0 8.9 - 10.3 mg/dL   Total Protein 6.2 (L) 6.5 - 8.1 g/dL   Albumin 2.3 (L) 3.5 - 5.0 g/dL   AST 260 (H) 15 - 41 U/L   ALT 263 (H) 0 - 44 U/L   Alkaline Phosphatase 57 38 - 126 U/L   Total Bilirubin 2.6 (H) 0.3 - 1.2 mg/dL   GFR, Estimated 28 (L) >60 mL/min   Anion gap 15 5 - 15  CBC     Status: Abnormal   Collection Time: 06/10/20  9:03 AM  Result Value Ref Range   WBC 7.8 4.0 - 10.5 K/uL   RBC 3.62 (L) 3.87 - 5.11 MIL/uL   Hemoglobin 9.9 (L) 12.0 - 15.0 g/dL   HCT 28.9 (L) 36.0 - 46.0 %   MCV 79.8 (L) 80.0 - 100.0 fL   MCH 27.3 26.0 - 34.0 pg   MCHC 34.3 30.0 - 36.0 g/dL   RDW 12.1 11.5 - 15.5 %   Platelets 218  150 - 400 K/uL   nRBC 0.0 0.0 - 0.2 %  TSH     Status: Abnormal   Collection Time: 06/10/20  5:30 PM  Result Value Ref Range   TSH <0.010 (L) 0.350 - 4.500 uIU/mL  T4, free     Status: Abnormal   Collection Time: 06/10/20  5:30 PM  Result Value Ref Range   Free T4 4.32 (H) 0.61 - 1.12 ng/dL  Lipase, blood     Status: Abnormal   Collection Time: 06/11/20 12:32 AM  Result Value Ref Range   Lipase 294 (H) 11 - 51 U/L  Comprehensive metabolic panel     Status: Abnormal   Collection Time: 06/11/20 12:32 AM  Result Value Ref Range   Sodium 129 (L) 135 - 145 mmol/L   Potassium 2.9 (L) 3.5 - 5.1 mmol/L   Chloride 90 (L) 98 - 111 mmol/L   CO2 26 22 - 32 mmol/L   Glucose, Bld 85 70 - 99 mg/dL   BUN 47 (H) 6 - 20 mg/dL   Creatinine, Ser 1.94 (H) 0.44 - 1.00 mg/dL   Calcium 8.8 (L) 8.9 - 10.3 mg/dL    Total Protein 5.4 (L) 6.5 - 8.1 g/dL   Albumin 2.2 (L) 3.5 - 5.0 g/dL   AST 202 (H) 15 - 41 U/L   ALT 210 (H) 0 - 44 U/L   Alkaline Phosphatase 57 38 - 126 U/L   Total Bilirubin 1.6 (H) 0.3 - 1.2 mg/dL   GFR, Estimated 35 (L) >60 mL/min   Anion gap 13 5 - 15  CBC     Status: Abnormal   Collection Time: 06/11/20 12:32 AM  Result Value Ref Range   WBC 6.9 4.0 - 10.5 K/uL   RBC 3.28 (L) 3.87 - 5.11 MIL/uL   Hemoglobin 8.9 (L) 12.0 - 15.0 g/dL   HCT 26.6 (L) 36.0 - 46.0 %   MCV 81.1 80.0 - 100.0 fL   MCH 27.1 26.0 - 34.0 pg   MCHC 33.5 30.0 - 36.0 g/dL   RDW 12.1 11.5 - 15.5 %   Platelets 180 150 - 400 K/uL   nRBC 0.0 0.0 - 0.2 %  Magnesium     Status: None   Collection Time: 06/11/20 12:32 AM  Result Value Ref Range   Magnesium 1.7 1.7 - 2.4 mg/dL    Studies/Results: US OB Comp Less 14 Wks  Result Date: 06/10/2020 CLINICAL DATA:  Bleeding EXAM: OBSTETRIC <14 WK ULTRASOUND TECHNIQUE: Transabdominal ultrasound was performed for evaluation of the gestation as well as the maternal uterus and adnexal regions. COMPARISON:  None. FINDINGS: Intrauterine gestational sac: Single Yolk sac:  Not visualized Embryo:  Visualized Cardiac Activity: Visualized Heart Rate: 164 bpm CRL: 78 mm   13 w 6 d                  [place] EDC: 12/10/2020 Subchorionic hemorrhage:  None visualized. Maternal uterus/adnexae: Ovaries nonvisualized. Large exophytic mass at the uterine fundus measuring 13.4 x 9.3 x 14.9 cm. No free fluid IMPRESSION: 1. Single viable intrauterine pregnancy as above. 2. Large 14.9 cm exophytic fibroid off the uterine fundus. Otherwise no specific abnormality is seen. Electronically Signed   By: Donavan Foil M.D.   On: 06/10/2020 01:45   US Abdomen Limited RUQ (LIVER/GB)  Result Date: 06/10/2020 CLINICAL DATA:  Nausea and vomiting, right upper quadrant pain, [redacted] weeks pregnant, increased liver function tests EXAM: ULTRASOUND ABDOMEN LIMITED RIGHT UPPER QUADRANT COMPARISON:  None. FINDINGS:  Gallbladder: Gallbladder is moderately distended, with gallbladder sludge layering dependently. No shadowing gallstones. There is borderline gallbladder wall thickening measuring 4 mm. No pericholecystic fluid. Negative sonographic Murphy sign. Common bile duct: Diameter: 3 mm Liver: No focal lesion identified. Within normal limits in parenchymal echogenicity. Portal vein is patent on color Doppler imaging with normal direction of blood flow towards the liver. Other: None. IMPRESSION: 1. Distended gallbladder, with internal echogenic sludge and borderline gallbladder wall thickening. No shadowing gallstones and negative sonographic Murphy sign. Findings are equivocal for acute cholecystitis. Electronically Signed   By: Randa Ngo M.D.   On: 06/10/2020 03:36    Scheduled Meds: . labetalol  100 mg Oral BID  . ondansetron  4 mg Oral Q6H  . polyethylene glycol  17 g Oral BID  . scopolamine  1 patch Transdermal Q72H   Continuous Infusions: . famotidine (PEPCID) IV Stopped (06/10/20 2130)  . lactated ringers 150 mL/hr at 06/11/20 0541  . potassium chloride    . promethazine (PHENERGAN) injection (IM or IVPB) Stopped (06/11/20 0321)   PRN Meds:acetaminophen **OR** acetaminophen    LOS: 1 day   Donnamae Jude, MD 06/11/2020 7:37 AM

## 2020-06-11 NOTE — Progress Notes (Signed)
   06/11/20 1311  Assess: MEWS Score  Temp 98.2 F (36.8 C)  BP 129/66  Pulse Rate (!) 111  Resp 16  SpO2 100 %  O2 Device Room Air  Assess: MEWS Score  MEWS Temp 0  MEWS Systolic 0  MEWS Pulse 2  MEWS RR 0  MEWS LOC 0  MEWS Score 2  MEWS Score Color Yellow  Assess: if the MEWS score is Yellow or Red  Were vital signs taken at a resting state? Yes  Focused Assessment No change from prior assessment  Early Detection of Sepsis Score *See Row Information* Low  MEWS guidelines implemented *See Row Information* Yes  Take Vital Signs  Increase Vital Sign Frequency  Yellow: Q 2hr X 2 then Q 4hr X 2, if remains yellow, continue Q 4hrs  Escalate  MEWS: Escalate Yellow: discuss with charge nurse/RN and consider discussing with provider and RRT  Notify: Charge Nurse/RN  Name of Charge Nurse/RN Notified Ronaldo Miyamoto  Date Charge Nurse/RN Notified 06/11/20  Time Charge Nurse/RN Notified 1330  Notify: Provider  Provider Name/Title Billie Ruddy  Date Provider Notified 06/11/20  Time Provider Notified 1335  Notification Type Page  Notification Reason Other (Comment) (yellow mews)  Provider response No new orders  Document  Progress note created (see row info) Yes  Pt heart rate triggered yellow MEWS. Pt had been running tachycardiac prior to being admitted on this unit. MD text paged

## 2020-06-11 NOTE — Progress Notes (Signed)
PROGRESS NOTE    Stacy Fowler  CBJ:[id number] DOB: 1990-08-27 DOA: 06/09/2020 PCP: Patient, No Pcp Per (Inactive)  [id number]   Assessment & Plan:   Active Problems:   HYPERTENSION, BENIGN ESSENTIAL   Acute gallstone pancreatitis   Hyperthyroidism   Hypokalemia   Hyponatremia   Abnormal LFTs   AKI (acute kidney injury) ([place])   Stacy Fowler is a 30 y.o. female with medical history significant for being 13 weeks 6 days pregnant. Reports he has been having abdominal pain with nausea and vomiting with poor p.o. intake for the last month.  She reports she has vomiting after eating solid foods and even when she just drinks water sometimes.  She has lost weight over the last 4 to 6 weeks.  Nurse midwife reported that patient has lost 14 kg over the last month month and a half that they have been following the patient.  Her mother in the room and elaborates her story that she is not eating much.  She reports over the last few weeks has been having upper and right upper quadrant abdominal pain that is worsened after she tries to eat.   She presented to the Maternity ward  for work-up and was evaluated by the Nurse Midwife on duty.  In the work-up she was found to have AKI, low potasium level, elevated LFTs, elevated lipase. RUQ u/s revealed a distended gallbladder with sludge and wall thickening.      Acute gallstone pancreatitis --cont MIVF --cont low-fat diet --No surgical interventional planned, per [place]  AKI  --likely 2/2 dehydration --cont MIVF  Hypokalemia --monitor and replete PRN  Hyperemesis gravidum Has had weight loss over the past month.  --cont MIVF --zofran and phenergan q6h scheduled, per Ob  Trichomonas infection --management per Ob  IUP at ~14 weeks --has not been followed by Ob --Ob consulted and following  Hyperthyroid --start methimazole, per Ob  HTN --start labetalol 100 mg BID, per Ob   DVT prophylaxis: None:Ambulating Code Status: Full  code  Family Communication: sister updated at bedside today  Level of care: Med-Surg Dispo:   The patient is from: home Anticipated d/c is to: home Anticipated d/c date is: 1-2 days Patient currently is not medically ready to d/c due to: acute pancreatitis and AKI on IVF.   Subjective and Interval History:  Pt tolerated low-fat diet.  No N/V.  Abdominal pain on the right side improved.  Pt said she hasn't had a BM in 2-4 weeks.   Objective: Vitals:   06/10/20 2000 06/11/20 0553 06/11/20 1311 06/11/20 1535  BP: (!) 107/59 (!) 107/58 129/66 (!) 110/57  Pulse: (!) 110 97 (!) 111 (!) 101  Resp: 18 17 16 17   Temp: 97.7 F (36.5 C) 98.4 F (36.9 C) 98.2 F (36.8 C) 98.3 F (36.8 C)  TempSrc: Oral Oral Oral Oral  SpO2: 100% 98% 100% 100%  Weight:      Height:        Intake/Output Summary (Last 24 hours) at 06/11/2020 1738 Last data filed at 06/11/2020 1300 Gross per 24 hour  Intake 1500 ml  Output --  Net 1500 ml   Filed Weights   06/09/20 2206  Weight: 117.9 kg    Examination:   Constitutional: NAD, AAOx3 HEENT: conjunctivae and lids normal, EOMI CV: No cyanosis.   RESP: normal respiratory effort, on RA Extremities: No effusions, edema in BLE SKIN: warm, dry Neuro: II - XII grossly intact.   Psych: subdued mood and affect.  Appropriate judgement  and reason   Data Reviewed: I have personally reviewed following labs and imaging studies  CBC: Recent Labs  Lab 06/09/20 2340 06/10/20 0903 06/11/20 0032  WBC 10.1 7.8 6.9  HGB 11.2* 9.9* 8.9*  HCT 32.7* 28.9* 26.6*  MCV 79.0* 79.8* 81.1  PLT 256 218 401   Basic Metabolic Panel: Recent Labs  Lab 06/09/20 2340 06/10/20 0225 06/10/20 0903 06/11/20 0032  NA 124*  --  125* 129*  K 2.6*  --  2.9* 2.9*  CL 76*  --  83* 90*  CO2 27  --  27 26  GLUCOSE 165*  --  136* 85  BUN 56*  --  53* 47*  CREATININE 2.58*  --  2.31* 1.94*  CALCIUM 9.5  --  9.0 8.8*  MG  --  2.1  --  1.7   GFR: Estimated Creatinine  Clearance: 55.8 mL/min (A) (by C-G formula based on SCr of 1.94 mg/dL (H)). Liver Function Tests: Recent Labs  Lab 06/09/20 2340 06/10/20 0903 06/11/20 0032  AST 286* 260* 202*  ALT 309* 263* 210*  ALKPHOS 67 57 57  BILITOT 3.1* 2.6* 1.6*  PROT 7.3 6.2* 5.4*  ALBUMIN 2.7* 2.3* 2.2*   Recent Labs  Lab 06/10/20 0220 06/11/20 0032  LIPASE 307* 294*   No results for input(s): AMMONIA in the last 168 hours. Coagulation Profile: No results for input(s): INR, PROTIME in the last 168 hours. Cardiac Enzymes: No results for input(s): CKTOTAL, CKMB, CKMBINDEX, TROPONINI in the last 168 hours. BNP (last 3 results) No results for input(s): PROBNP in the last 8760 hours. HbA1C: No results for input(s): HGBA1C in the last 72 hours. CBG: No results for input(s): GLUCAP in the last 168 hours. Lipid Profile: No results for input(s): CHOL, HDL, LDLCALC, TRIG, CHOLHDL, LDLDIRECT in the last 72 hours. Thyroid Function Tests: Recent Labs    06/10/20 1730  TSH <0.010*  FREET4 4.32*  T3FREE 8.8*   Anemia Panel: No results for input(s): VITAMINB12, FOLATE, FERRITIN, TIBC, IRON, RETICCTPCT in the last 72 hours. Sepsis Labs: No results for input(s): PROCALCITON, LATICACIDVEN in the last 168 hours.  Recent Results (from the past 240 hour(s))  Wet prep, genital     Status: Abnormal   Collection Time: 06/10/20  2:34 AM   Specimen: Genital  Result Value Ref Range Status   Yeast Wet Prep HPF POC NONE SEEN NONE SEEN Final   Trich, Wet Prep PRESENT (A) NONE SEEN Final   Clue Cells Wet Prep HPF POC PRESENT (A) NONE SEEN Final   WBC, Wet Prep HPF POC FEW (A) NONE SEEN Final   Sperm NONE SEEN  Final    Comment: Performed at [facility], [place]. [address], [place], [place] [address]  Resp Panel by RT-PCR (Flu A&B, Covid) Nasopharyngeal Swab     Status: None   Collection Time: 06/10/20  9:00 AM   Specimen: Nasopharyngeal Swab; Nasopharyngeal(NP) swabs in vial transport medium  Result Value  Ref Range Status   SARS Coronavirus 2 by RT PCR NEGATIVE NEGATIVE Final    Comment: (NOTE) SARS-CoV-2 target nucleic acids are NOT DETECTED.  The SARS-CoV-2 RNA is generally detectable in upper respiratory specimens during the acute phase of infection. The lowest concentration of SARS-CoV-2 viral copies this assay can detect is 138 copies/mL. A negative result does not preclude SARS-Cov-2 infection and should not be used as the sole basis for treatment or other patient management decisions. A negative result may occur with  improper specimen  collection/handling, submission of specimen other than nasopharyngeal swab, presence of viral mutation(s) within the areas targeted by this assay, and inadequate number of viral copies(<138 copies/mL). A negative result must be combined with clinical observations, patient history, and epidemiological information. The expected result is Negative.  Fact Sheet for Patients:  EntrepreneurPulse.com.au  Fact Sheet for Healthcare Providers:  IncredibleEmployment.be  This test is no t yet approved or cleared by the [place] FDA and  has been authorized for detection and/or diagnosis of SARS-CoV-2 by FDA under an Emergency Use Authorization (EUA). This EUA will remain  in effect (meaning this test can be used) for the duration of the COVID-19 declaration under Section 564(b)(1) of the Act, 21 U.S.C.section 360bbb-3(b)(1), unless the authorization is terminated  or revoked sooner.       Influenza A by PCR NEGATIVE NEGATIVE Final   Influenza B by PCR NEGATIVE NEGATIVE Final    Comment: (NOTE) The Xpert Xpress SARS-CoV-2/FLU/RSV plus assay is intended as an aid in the diagnosis of influenza from Nasopharyngeal swab specimens and should not be used as a sole basis for treatment. Nasal washings and aspirates are unacceptable for Xpert Xpress SARS-CoV-2/FLU/RSV testing.  Fact Sheet for  Patients: EntrepreneurPulse.com.au  Fact Sheet for Healthcare Providers: IncredibleEmployment.be  This test is not yet approved or cleared by the [place] FDA and has been authorized for detection and/or diagnosis of SARS-CoV-2 by FDA under an Emergency Use Authorization (EUA). This EUA will remain in effect (meaning this test can be used) for the duration of the COVID-19 declaration under Section 564(b)(1) of the Act, 21 U.S.C. section 360bbb-3(b)(1), unless the authorization is terminated or revoked.  Performed at [facility], [place] [address], [place],  [address]       Radiology Studies: [place] Comp Less 14 Wks  Result Date: 06/10/2020 CLINICAL DATA:  Bleeding EXAM: OBSTETRIC <14 WK ULTRASOUND TECHNIQUE: Transabdominal ultrasound was performed for evaluation of the gestation as well as the maternal uterus and adnexal regions. COMPARISON:  None. FINDINGS: Intrauterine gestational sac: Single Yolk sac:  Not visualized Embryo:  Visualized Cardiac Activity: Visualized Heart Rate: 164 bpm CRL: 78 mm   13 w 6 d                  [place] EDC: 12/10/2020 Subchorionic hemorrhage:  None visualized. Maternal uterus/adnexae: Ovaries nonvisualized. Large exophytic mass at the uterine fundus measuring 13.4 x 9.3 x 14.9 cm. No free fluid IMPRESSION: 1. Single viable intrauterine pregnancy as above. 2. Large 14.9 cm exophytic fibroid off the uterine fundus. Otherwise no specific abnormality is seen. Electronically Signed   By: Donavan Foil M.D.   On: 06/10/2020 01:45   US Abdomen Limited RUQ (LIVER/GB)  Result Date: 06/10/2020 CLINICAL DATA:  Nausea and vomiting, right upper quadrant pain, [redacted] weeks pregnant, increased liver function tests EXAM: ULTRASOUND ABDOMEN LIMITED RIGHT UPPER QUADRANT COMPARISON:  None. FINDINGS: Gallbladder: Gallbladder is moderately distended, with gallbladder sludge layering dependently. No shadowing gallstones. There is  borderline gallbladder wall thickening measuring 4 mm. No pericholecystic fluid. Negative sonographic Murphy sign. Common bile duct: Diameter: 3 mm Liver: No focal lesion identified. Within normal limits in parenchymal echogenicity. Portal vein is patent on color Doppler imaging with normal direction of blood flow towards the liver. Other: None. IMPRESSION: 1. Distended gallbladder, with internal echogenic sludge and borderline gallbladder wall thickening. No shadowing gallstones and negative sonographic Murphy sign. Findings are equivocal for acute cholecystitis. Electronically Signed   By: Diana Eves.D.  On: 06/10/2020 03:36     Scheduled Meds: . aspirin  81 mg Oral Daily  . labetalol  100 mg Oral BID  . methimazole  5 mg Oral TID  . ondansetron  4 mg Oral Q6H  . polyethylene glycol  34 g Oral Q2H  . potassium chloride  20 mEq Oral BID  . scopolamine  1 patch Transdermal Q72H   Continuous Infusions: . famotidine (PEPCID) IV 20 mg (06/11/20 1130)  . lactated ringers 150 mL/hr at 06/11/20 1719  . promethazine (PHENERGAN) injection (IM or IVPB) Stopped (06/11/20 0321)     LOS: 1 day     Enzo Bi, MD [facility] If 7PM-7AM, please contact night-coverage 06/11/2020, 5:38 PM

## 2020-06-12 DIAGNOSIS — O99891 Other specified diseases and conditions complicating pregnancy: Secondary | ICD-10-CM

## 2020-06-12 DIAGNOSIS — E876 Hypokalemia: Secondary | ICD-10-CM

## 2020-06-12 DIAGNOSIS — E871 Hypo-osmolality and hyponatremia: Secondary | ICD-10-CM

## 2020-06-12 DIAGNOSIS — N179 Acute kidney failure, unspecified: Secondary | ICD-10-CM | POA: Diagnosis not present

## 2020-06-12 DIAGNOSIS — E0590 Thyrotoxicosis, unspecified without thyrotoxic crisis or storm: Secondary | ICD-10-CM | POA: Diagnosis not present

## 2020-06-12 DIAGNOSIS — O99281 Endocrine, nutritional and metabolic diseases complicating pregnancy, first trimester: Secondary | ICD-10-CM | POA: Diagnosis not present

## 2020-06-12 DIAGNOSIS — O98312 Other infections with a predominantly sexual mode of transmission complicating pregnancy, second trimester: Secondary | ICD-10-CM

## 2020-06-12 DIAGNOSIS — A599 Trichomoniasis, unspecified: Secondary | ICD-10-CM

## 2020-06-12 DIAGNOSIS — Z3A14 14 weeks gestation of pregnancy: Secondary | ICD-10-CM

## 2020-06-12 LAB — CBC
HCT: 23.9 % — ABNORMAL LOW (ref 36.0–46.0)
Hemoglobin: 7.9 g/dL — ABNORMAL LOW (ref 12.0–15.0)
MCH: 27.5 pg (ref 26.0–34.0)
MCHC: 33.1 g/dL (ref 30.0–36.0)
MCV: 83.3 fL (ref 80.0–100.0)
Platelets: 177 10*3/uL (ref 150–400)
RBC: 2.87 MIL/uL — ABNORMAL LOW (ref 3.87–5.11)
RDW: 12.4 % (ref 11.5–15.5)
WBC: 7.9 10*3/uL (ref 4.0–10.5)
nRBC: 0.3 % — ABNORMAL HIGH (ref 0.0–0.2)

## 2020-06-12 LAB — COMPREHENSIVE METABOLIC PANEL
ALT: 158 U/L — ABNORMAL HIGH (ref 0–44)
AST: 146 U/L — ABNORMAL HIGH (ref 15–41)
Albumin: 2 g/dL — ABNORMAL LOW (ref 3.5–5.0)
Alkaline Phosphatase: 82 U/L (ref 38–126)
Anion gap: 10 (ref 5–15)
BUN: 36 mg/dL — ABNORMAL HIGH (ref 6–20)
CO2: 30 mmol/L (ref 22–32)
Calcium: 8.8 mg/dL — ABNORMAL LOW (ref 8.9–10.3)
Chloride: 94 mmol/L — ABNORMAL LOW (ref 98–111)
Creatinine, Ser: 1.59 mg/dL — ABNORMAL HIGH (ref 0.44–1.00)
GFR, Estimated: 44 mL/min — ABNORMAL LOW (ref >60)
Glucose, Bld: 126 mg/dL — ABNORMAL HIGH (ref 70–99)
Potassium: 3 mmol/L — ABNORMAL LOW (ref 3.5–5.1)
Sodium: 134 mmol/L — ABNORMAL LOW (ref 135–145)
Total Bilirubin: 1 mg/dL (ref 0.3–1.2)
Total Protein: 5.1 g/dL — ABNORMAL LOW (ref 6.5–8.1)

## 2020-06-12 LAB — LIPASE, BLOOD: Lipase: 198 U/L — ABNORMAL HIGH (ref 11–51)

## 2020-06-12 LAB — GC/CHLAMYDIA PROBE AMP (~~LOC~~) NOT AT ARMC
Chlamydia: NEGATIVE
Comment: NEGATIVE
Comment: NORMAL
Neisseria Gonorrhea: NEGATIVE

## 2020-06-12 LAB — MAGNESIUM: Magnesium: 1.5 mg/dL — ABNORMAL LOW (ref 1.7–2.4)

## 2020-06-12 MED ORDER — MAGNESIUM SULFATE 2 GM/50ML IV SOLN
2.0000 g | Freq: Once | INTRAVENOUS | Status: AC
  Administered 2020-06-12: 2 g via INTRAVENOUS
  Filled 2020-06-12: qty 50

## 2020-06-12 MED ORDER — URSODIOL 300 MG PO CAPS
300.0000 mg | ORAL_CAPSULE | Freq: Three times a day (TID) | ORAL | Status: DC
  Administered 2020-06-12 – 2020-06-15 (×9): 300 mg via ORAL
  Filled 2020-06-12 (×13): qty 1

## 2020-06-12 MED ORDER — POLYETHYLENE GLYCOL 3350 17 G PO PACK
34.0000 g | PACK | ORAL | Status: DC
  Administered 2020-06-12 (×2): 34 g via ORAL
  Filled 2020-06-12 (×3): qty 2

## 2020-06-12 MED ORDER — POTASSIUM CHLORIDE CRYS ER 20 MEQ PO TBCR
40.0000 meq | EXTENDED_RELEASE_TABLET | ORAL | Status: DC
  Filled 2020-06-12: qty 2

## 2020-06-12 MED ORDER — METRONIDAZOLE 500 MG PO TABS
500.0000 mg | ORAL_TABLET | Freq: Two times a day (BID) | ORAL | Status: DC
  Administered 2020-06-12 – 2020-06-15 (×7): 500 mg via ORAL
  Filled 2020-06-12 (×9): qty 1

## 2020-06-12 MED ORDER — POTASSIUM CHLORIDE 20 MEQ PO PACK
40.0000 meq | PACK | Freq: Once | ORAL | Status: AC
  Administered 2020-06-12: 40 meq via ORAL
  Filled 2020-06-12: qty 2

## 2020-06-12 MED ORDER — MAGNESIUM CITRATE PO SOLN
1.0000 | Freq: Once | ORAL | Status: AC
  Administered 2020-06-12: 1 via ORAL
  Filled 2020-06-12: qty 296

## 2020-06-12 NOTE — Progress Notes (Signed)
FACULTY PRACTICE ANTEPARTUM PROGRESS NOTE  Stacy Fowler is a 30 y.o. G1P0 at [redacted]w[redacted]d who is admitted for emesis and deranged electrolytes.  Estimated Date of Delivery: 12/10/20  Length of Stay:  2 Days. Admitted 06/09/2020  Subjective: Patient reports feeling better this am compared with previous days. She is able to eat and drink and is keeping things down. She is still having some discomfort/pain in RUQ but improved. Some cramping pelvic pain but denies bleeding.    Vitals:  Blood pressure 118/60, pulse 95, temperature 98.2 F (36.8 C), temperature source Oral, resp. rate 18, height 5\' 7"  (1.702 m), weight 117.9 kg, last menstrual period 03/05/2020, SpO2 98 %. Physical Examination: CONSTITUTIONAL: Well-developed, well-nourished female in no acute distress.  SKIN: Skin is warm and dry. No rash noted. Not diaphoretic. No erythema. No pallor. [place]: Alert and oriented to person, place, and time. Normal reflexes, muscle tone coordination. No cranial nerve deficit noted. PSYCHIATRIC: Normal mood and affect. Normal behavior. Normal judgment and thought content. CARDIOVASCULAR: Normal heart rate noted RESPIRATORY: Effort normal, no problems with respiration noted MUSCULOSKELETAL: Normal range of motion. No edema and no tenderness. ABDOMEN: Soft, diffusely tender, R>L, nondistended CERVIX: deferred  Results for orders placed or performed during the hospital encounter of 06/09/20 (from the past 48 hour(s))  TSH     Status: Abnormal   Collection Time: 06/10/20  5:30 PM  Result Value Ref Range   TSH <0.010 (L) 0.350 - 4.500 uIU/mL    Comment: Performed by a 3rd Generation assay with a functional sensitivity of <=0.01 uIU/mL. Performed at [facility], [place] [address] [place], [place] [address]   T4, free     Status: Abnormal   Collection Time: 06/10/20  5:30 PM  Result Value Ref Range   Free T4 4.32 (H) 0.61 - 1.12 ng/dL    Comment: (NOTE) Biotin ingestion may interfere with free T4  tests. If the results are inconsistent with the TSH level, previous test results, or the clinical presentation, then consider biotin interference. If needed, order repeat testing after stopping biotin. Performed at [facility], [place] [address], [place], [place] [address]   T3, free     Status: Abnormal   Collection Time: 06/10/20  5:30 PM  Result Value Ref Range   T3, Free 8.8 (H) 2.0 - 4.4 pg/mL    Comment: (NOTE) Performed At: [facility] [address] [place], [place] [id number] Rush Farmer MD EG:[id number]   Lipase, blood     Status: Abnormal   Collection Time: 06/11/20 12:32 AM  Result Value Ref Range   Lipase 294 (H) 11 - 51 U/L    Comment: Performed at [facility], [place] [address], [place], [place] [address]  Comprehensive metabolic panel     Status: Abnormal   Collection Time: 06/11/20 12:32 AM  Result Value Ref Range   Sodium 129 (L) 135 - 145 mmol/L   Potassium 2.9 (L) 3.5 - 5.1 mmol/L   Chloride 90 (L) 98 - 111 mmol/L   CO2 26 22 - 32 mmol/L   Glucose, Bld 85 70 - 99 mg/dL    Comment: Glucose reference range applies only to samples taken after fasting for at least 8 hours.   BUN 47 (H) 6 - 20 mg/dL   Creatinine, Ser 1.94 (H) 0.44 - 1.00 mg/dL   Calcium 8.8 (L) 8.9 - 10.3 mg/dL   Total Protein 5.4 (L) 6.5 - 8.1 g/dL   Albumin 2.2 (L) 3.5 -  5.0 g/dL   AST 202 (H) 15 - 41 U/L   ALT 210 (H) 0 - 44 U/L   Alkaline Phosphatase 57 38 - 126 U/L   Total Bilirubin 1.6 (H) 0.3 - 1.2 mg/dL   GFR, Estimated 35 (L) >60 mL/min    Comment: (NOTE) Calculated using the CKD-EPI Creatinine Equation (2021)    Anion gap 13 5 - 15    Comment: Performed at [facility] [address] [place], [place] [address]  CBC     Status: Abnormal   Collection Time: 06/11/20 12:32 AM  Result Value Ref Range   WBC 6.9 4.0 - 10.5 K/uL   RBC 3.28 (L) 3.87 - 5.11 MIL/uL   Hemoglobin 8.9 (L) 12.0 - 15.0 g/dL   HCT 26.6 (L) 36.0 - 46.0 %   MCV 81.1 80.0 - 100.0 fL    MCH 27.1 26.0 - 34.0 pg   MCHC 33.5 30.0 - 36.0 g/dL   RDW 12.1 11.5 - 15.5 %   Platelets 180 150 - 400 K/uL   nRBC 0.0 0.0 - 0.2 %    Comment: Performed at [facility], [place] [address], [place], [place] [address]  Magnesium     Status: None   Collection Time: 06/11/20 12:32 AM  Result Value Ref Range   Magnesium 1.7 1.7 - 2.4 mg/dL    Comment: Performed at [facility], [place] [address], [place], [place] [address]  Magnesium     Status: Abnormal   Collection Time: 06/12/20 12:48 AM  Result Value Ref Range   Magnesium 1.5 (L) 1.7 - 2.4 mg/dL    Comment: Performed at [facility] [address], [place], [place] [address]  Comprehensive metabolic panel     Status: Abnormal   Collection Time: 06/12/20 12:48 AM  Result Value Ref Range   Sodium 134 (L) 135 - 145 mmol/L   Potassium 3.0 (L) 3.5 - 5.1 mmol/L   Chloride 94 (L) 98 - 111 mmol/L   CO2 30 22 - 32 mmol/L   Glucose, Bld 126 (H) 70 - 99 mg/dL    Comment: Glucose reference range applies only to samples taken after fasting for at least 8 hours.   BUN 36 (H) 6 - 20 mg/dL   Creatinine, Ser 1.59 (H) 0.44 - 1.00 mg/dL   Calcium 8.8 (L) 8.9 - 10.3 mg/dL   Total Protein 5.1 (L) 6.5 - 8.1 g/dL   Albumin 2.0 (L) 3.5 - 5.0 g/dL   AST 146 (H) 15 - 41 U/L   ALT 158 (H) 0 - 44 U/L   Alkaline Phosphatase 82 38 - 126 U/L   Total Bilirubin 1.0 0.3 - 1.2 mg/dL   GFR, Estimated 44 (L) >60 mL/min    Comment: (NOTE) Calculated using the CKD-EPI Creatinine Equation (2021)    Anion gap 10 5 - 15    Comment: Performed at [facility], [place] [address], [place], [place] [address]  Lipase, blood     Status: Abnormal   Collection Time: 06/12/20 12:48 AM  Result Value Ref Range   Lipase 198 (H) 11 - 51 U/L    Comment: Performed at [facility], [place] [address] [place] [address]  CBC     Status: Abnormal   Collection Time: 06/12/20 12:48 AM  Result Value Ref Range   WBC 7.9 4.0 - 10.5 K/uL   RBC 2.87 (L)  3.87 - 5.11 MIL/uL   Hemoglobin  7.9 (L) 12.0 - 15.0 g/dL   HCT 23.9 (L) 36.0 - 46.0 %   MCV 83.3 80.0 - 100.0 fL   MCH 27.5 26.0 - 34.0 pg   MCHC 33.1 30.0 - 36.0 g/dL   RDW 12.4 11.5 - 15.5 %   Platelets 177 150 - 400 K/uL   nRBC 0.3 (H) 0.0 - 0.2 %    Comment: Performed at [facility] [address] [place], [place] [address]    I have reviewed the patient's current medications.  ASSESSMENT: Active Problems:   HYPERTENSION, BENIGN ESSENTIAL   Acute gallstone pancreatitis   Hyperthyroidism   Hypokalemia   Hyponatremia   Abnormal LFTs   AKI (acute kidney injury) ([place])   PLAN: Thyroid - methimazole started - reviewed with patient that she will need follow up labs in several weeks for this, can be done at [facility] office  Hyperemesis - improved significantly, patient tolerating almost full diet - cont IVFs  Trichomonas - will start PO flagyl as she is tolerating PO now - to complete 7 days, 500 mg BID  HTN - started on labetalol 100 mg BID, titrate prn  RUQ pain - no intervention at this point per gen surg - AST/ALT improving  AKI - improving significantly   Electrolyte abnormalities - improving - cont repleting prn   Pregnancy - cont baby ASA - will need close follow up with OB once discharged   Thank you for this consult, will follow along.   Feliz Beam, MD, [facility] [employer] [facility] (Faculty Practice)  06/12/2020 1:18 PM

## 2020-06-12 NOTE — Progress Notes (Addendum)
PROGRESS NOTE    Stacy Fowler  NLG:[id number] DOB: 02-09-1990 DOA: 06/09/2020 PCP: Patient, No Pcp Per (Inactive)  [id number]   Assessment & Plan:   Active Problems:   HYPERTENSION, BENIGN ESSENTIAL   Acute gallstone pancreatitis   Hyperthyroidism   Hypokalemia   Hyponatremia   Abnormal LFTs   AKI (acute kidney injury) ([place])   Stacy Fowler is a 30 y.o. female with medical history significant for being 13 weeks 6 days pregnant. Reports he has been having abdominal pain with nausea and vomiting with poor p.o. intake for the last month.  She reports she has vomiting after eating solid foods and even when she just drinks water sometimes.  She has lost weight over the last 4 to 6 weeks.  Nurse midwife reported that patient has lost 14 kg over the last month month and a half that they have been following the patient.  Her mother in the room and elaborates her story that she is not eating much.  She reports over the last few weeks has been having upper and right upper quadrant abdominal pain that is worsened after she tries to eat.   She presented to the Maternity ward  for work-up and was evaluated by the Nurse Midwife on duty.  In the work-up she was found to have AKI, low potasium level, elevated LFTs, elevated lipase. RUQ u/s revealed a distended gallbladder with sludge and wall thickening.      Acute gallstone pancreatitis with gallbladder sludge --cont MIVF --cont low-fat diet --No surgical interventional planned, per [place] --start ursodiol to thin gallbladder sludge, per surgery  AKI  --likely 2/2 dehydration --cont MIVF  Hypokalemia --monitor and replete PRN --order potassium pocket, pt can tolerate tablet  Hypomag --replete with IV mag  Hyperemesis gravidum Has had weight loss over the past month.  --cont MIVF --zofran and phenergan q6h scheduled, per Ob --Scopolamine patch, per Ob  Trichomonas infection --start Flagyl, per Ob --to complete 7 days  IUP at  ~14 weeks --has not been followed by Ob, but does have upcoming outpatient appointment to establish with one. --Ob consulted and following  Hyperthyroid --cont methimazole, started by Ob  HTN --cont labetalol 100 mg BID, started by Ob  Constipation --pt reported no BM for 2-4 weeks, maybe contributing to abdominal pain --aggressive Miralax   DVT prophylaxis: None:Ambulating Code Status: Full code  Family Communication:   Level of care: Med-Surg Dispo:   The patient is from: home Anticipated d/c is to: home Anticipated d/c date is: 1-2 days Patient currently is not medically ready to d/c due to: acute pancreatitis and AKI on IVF.   Subjective and Interval History:  Still had not had BM after aggressive Miralax yesterday.    Pt complained of pressure in her abdomen.  No N/V.  Tolerating low fat diet.   Objective: Vitals:   06/12/20 0138 06/12/20 0551 06/12/20 1035 06/12/20 1241  BP: 120/61 132/63 136/70 118/60  Pulse: (!) 102 96 100 95  Resp: 18 18 18 18   Temp: 98.3 F (36.8 C) 98.1 F (36.7 C) 97.6 F (36.4 C) 98.2 F (36.8 C)  TempSrc: Oral Oral Oral Oral  SpO2: 99% 98% 99% 98%  Weight:      Height:        Intake/Output Summary (Last 24 hours) at 06/12/2020 1930 Last data filed at 06/12/2020 0450 Gross per 24 hour  Intake 480 ml  Output --  Net 480 ml   Filed Weights   06/09/20 2206  Weight:  117.9 kg    Examination:   Constitutional: NAD, AAOx3 HEENT: conjunctivae and lids normal, EOMI CV: No cyanosis.   RESP: normal respiratory effort, on RA Extremities: No effusions, edema in BLE SKIN: warm, dry Neuro: II - XII grossly intact.   Psych: depressed mood and affect.  Appropriate judgement and reason   Data Reviewed: I have personally reviewed following labs and imaging studies  CBC: Recent Labs  Lab 06/09/20 2340 06/10/20 0903 06/11/20 0032 06/12/20 0048  WBC 10.1 7.8 6.9 7.9  HGB 11.2* 9.9* 8.9* 7.9*  HCT 32.7* 28.9* 26.6* 23.9*  MCV  79.0* 79.8* 81.1 83.3  PLT 256 218 180 468   Basic Metabolic Panel: Recent Labs  Lab 06/09/20 2340 06/10/20 0225 06/10/20 0903 06/11/20 0032 06/12/20 0048  NA 124*  --  125* 129* 134*  K 2.6*  --  2.9* 2.9* 3.0*  CL 76*  --  83* 90* 94*  CO2 27  --  27 26 30   GLUCOSE 165*  --  136* 85 126*  BUN 56*  --  53* 47* 36*  CREATININE 2.58*  --  2.31* 1.94* 1.59*  CALCIUM 9.5  --  9.0 8.8* 8.8*  MG  --  2.1  --  1.7 1.5*   GFR: Estimated Creatinine Clearance: 68.1 mL/min (A) (by C-G formula based on SCr of 1.59 mg/dL (H)). Liver Function Tests: Recent Labs  Lab 06/09/20 2340 06/10/20 0903 06/11/20 0032 06/12/20 0048  AST 286* 260* 202* 146*  ALT 309* 263* 210* 158*  ALKPHOS 67 57 57 82  BILITOT 3.1* 2.6* 1.6* 1.0  PROT 7.3 6.2* 5.4* 5.1*  ALBUMIN 2.7* 2.3* 2.2* 2.0*   Recent Labs  Lab 06/10/20 0220 06/11/20 0032 06/12/20 0048  LIPASE 307* 294* 198*   No results for input(s): AMMONIA in the last 168 hours. Coagulation Profile: No results for input(s): INR, PROTIME in the last 168 hours. Cardiac Enzymes: No results for input(s): CKTOTAL, CKMB, CKMBINDEX, TROPONINI in the last 168 hours. BNP (last 3 results) No results for input(s): PROBNP in the last 8760 hours. HbA1C: No results for input(s): HGBA1C in the last 72 hours. CBG: No results for input(s): GLUCAP in the last 168 hours. Lipid Profile: No results for input(s): CHOL, HDL, LDLCALC, TRIG, CHOLHDL, LDLDIRECT in the last 72 hours. Thyroid Function Tests: Recent Labs    06/10/20 1730  TSH <0.010*  FREET4 4.32*  T3FREE 8.8*   Anemia Panel: No results for input(s): VITAMINB12, FOLATE, FERRITIN, TIBC, IRON, RETICCTPCT in the last 72 hours. Sepsis Labs: No results for input(s): PROCALCITON, LATICACIDVEN in the last 168 hours.  Recent Results (from the past 240 hour(s))  Wet prep, genital     Status: Abnormal   Collection Time: 06/10/20  2:34 AM   Specimen: Genital  Result Value Ref Range Status   Yeast  Wet Prep HPF POC NONE SEEN NONE SEEN Final   Trich, Wet Prep PRESENT (A) NONE SEEN Final   Clue Cells Wet Prep HPF POC PRESENT (A) NONE SEEN Final   WBC, Wet Prep HPF POC FEW (A) NONE SEEN Final   Sperm NONE SEEN  Final    Comment: Performed at [facility], [place]. [address], [place], [place] [address]  Resp Panel by RT-PCR (Flu A&B, Covid) Nasopharyngeal Swab     Status: None   Collection Time: 06/10/20  9:00 AM   Specimen: Nasopharyngeal Swab; Nasopharyngeal(NP) swabs in vial transport medium  Result Value Ref Range Status   SARS Coronavirus 2  by RT PCR NEGATIVE NEGATIVE Final    Comment: (NOTE) SARS-CoV-2 target nucleic acids are NOT DETECTED.  The SARS-CoV-2 RNA is generally detectable in upper respiratory specimens during the acute phase of infection. The lowest concentration of SARS-CoV-2 viral copies this assay can detect is 138 copies/mL. A negative result does not preclude SARS-Cov-2 infection and should not be used as the sole basis for treatment or other patient management decisions. A negative result may occur with  improper specimen collection/handling, submission of specimen other than nasopharyngeal swab, presence of viral mutation(s) within the areas targeted by this assay, and inadequate number of viral copies(<138 copies/mL). A negative result must be combined with clinical observations, patient history, and epidemiological information. The expected result is Negative.  Fact Sheet for Patients:  EntrepreneurPulse.com.au  Fact Sheet for Healthcare Providers:  IncredibleEmployment.be  This test is no t yet approved or cleared by the [place] FDA and  has been authorized for detection and/or diagnosis of SARS-CoV-2 by FDA under an Emergency Use Authorization (EUA). This EUA will remain  in effect (meaning this test can be used) for the duration of the COVID-19 declaration under Section 564(b)(1) of the Act,  21 U.S.C.section 360bbb-3(b)(1), unless the authorization is terminated  or revoked sooner.       Influenza A by PCR NEGATIVE NEGATIVE Final   Influenza B by PCR NEGATIVE NEGATIVE Final    Comment: (NOTE) The Xpert Xpress SARS-CoV-2/FLU/RSV plus assay is intended as an aid in the diagnosis of influenza from Nasopharyngeal swab specimens and should not be used as a sole basis for treatment. Nasal washings and aspirates are unacceptable for Xpert Xpress SARS-CoV-2/FLU/RSV testing.  Fact Sheet for Patients: EntrepreneurPulse.com.au  Fact Sheet for Healthcare Providers: IncredibleEmployment.be  This test is not yet approved or cleared by the [place] FDA and has been authorized for detection and/or diagnosis of SARS-CoV-2 by FDA under an Emergency Use Authorization (EUA). This EUA will remain in effect (meaning this test can be used) for the duration of the COVID-19 declaration under Section 564(b)(1) of the Act, 21 U.S.C. section 360bbb-3(b)(1), unless the authorization is terminated or revoked.  Performed at [facility], [place] [address] [place], [place] [address]       Radiology Studies: No results found.   Scheduled Meds: . aspirin  81 mg Oral Daily  . labetalol  100 mg Oral BID  . methimazole  5 mg Oral TID  . metroNIDAZOLE  500 mg Oral Q12H  . ondansetron  4 mg Oral Q6H  . polyethylene glycol  34 g Oral Q2H  . scopolamine  1 patch Transdermal Q72H  . ursodiol  300 mg Oral TID   Continuous Infusions: . famotidine (PEPCID) IV 20 mg (06/12/20 1222)  . lactated ringers 100 mL/hr at 06/11/20 1739  . promethazine (PHENERGAN) injection (IM or IVPB) Stopped (06/11/20 0321)     LOS: 2 days     Enzo Bi, MD [facility] If 7PM-7AM, please contact night-coverage 06/12/2020, 7:30 PM

## 2020-06-12 NOTE — Progress Notes (Signed)
Pt tolerated dinner brought in by family.  No episodes of vomiting today. Pt refused phenergan doses on dayshift.  Zofran was sufficient for waves of nausea.  Pt walked in hallway with mother and grandmother at end of shift.

## 2020-06-12 NOTE — Progress Notes (Signed)
   Progress Note     Subjective: Patient still has some nausea but has been tolerating PO intake. She reports mostly pelvic pain but does have some R flank pain at times. Nausea and pain do not necessarily seem associated with eating.   Objective: Vital signs in last 24 hours: Temp:  [98.1 F (36.7 C)-98.7 F (37.1 C)] 98.1 F (36.7 C) (05/31 0551) Pulse Rate:  [96-111] 96 (05/31 0551) Resp:  [16-22] 18 (05/31 0551) BP: (110-132)/(52-66) 132/63 (05/31 0551) SpO2:  [96 %-100 %] 98 % (05/31 0551)    Intake/Output from previous day: 05/30 0701 - 05/31 0700 In: 960 [P.O.:960] Out: -  Intake/Output this shift: No intake/output data recorded.  PE: General: pleasant, WD, obese female who is sitting up in NAD HEENT: Sclera are anicteric.  Heart: regular, rate, and rhythm.   Lungs: Respiratory effort nonlabored Abd: soft, mildly ttp in RUQ, ND, +BS, no masses, hernias, or organomegaly MS: all 4 extremities are symmetrical with no cyanosis, clubbing, or edema. Skin: warm and dry with no masses, lesions, or rashes Psych: A&Ox3 with an appropriate affect.    Lab Results:  Recent Labs    06/11/20 0032 06/12/20 0048  WBC 6.9 7.9  HGB 8.9* 7.9*  HCT 26.6* 23.9*  PLT 180 177   BMET Recent Labs    06/11/20 0032 06/12/20 0048  NA 129* 134*  K 2.9* 3.0*  CL 90* 94*  CO2 26 30  GLUCOSE 85 126*  BUN 47* 36*  CREATININE 1.94* 1.59*  CALCIUM 8.8* 8.8*   PT/INR No results for input(s): LABPROT, INR in the last 72 hours. CMP     Component Value Date/Time   NA 134 (L) 06/12/2020 0048   K 3.0 (L) 06/12/2020 0048   CL 94 (L) 06/12/2020 0048   CO2 30 06/12/2020 0048   GLUCOSE 126 (H) 06/12/2020 0048   BUN 36 (H) 06/12/2020 0048   CREATININE 1.59 (H) 06/12/2020 0048   CALCIUM 8.8 (L) 06/12/2020 0048   PROT 5.1 (L) 06/12/2020 0048   ALBUMIN 2.0 (L) 06/12/2020 0048   AST 146 (H) 06/12/2020 0048   ALT 158 (H) 06/12/2020 0048   ALKPHOS 82 06/12/2020 0048   BILITOT 1.0  06/12/2020 0048   GFRNONAA 44 (L) 06/12/2020 0048   Lipase     Component Value Date/Time   LIPASE 198 (H) 06/12/2020 0048       Studies/Results: No results found.  Anti-infectives: Anti-infectives (From admission, onward)   None       Assessment/Plan 13W Intrauterine pregnancy with suspected hyperemesis gravidarum Dehydration with AKI - Cr 1.59 today, improving Electrolyte derangements - replacement per medicine Trichomonas infection - per medicine ?UTI - per medicine Anemia, unspecified - hgb 7.9 this AM, anemia is physiologic in pregnancy but may also be dilutional given IV fluids, per primary attending  Abnormal thyroid function - tsh 0.010 Morbid Obesity - BMI 40.72  Elevated lipase with elevated LFTs and biliary sludge - equivocal signs of cholecystitis on u/s, no gallstones, +sludge/boderline wall thickening. no fever. No wbc but does have right sided tenderness - lfts improving, lipase improving - could try ursodiol and see if R sided pain improves although currently pain does not seem to be associated with eating   FEN: HH diet VTE: SCDs ID: PO methimazole 5/30>>  LOS: 2 days    Norm Parcel, [facility] Surgery 06/12/2020, 8:45 AM Please see Amion for pager number during day hours 7:00am-4:30pm

## 2020-06-13 ENCOUNTER — Inpatient Hospital Stay (HOSPITAL_COMMUNITY): Payer: Medicaid Other

## 2020-06-13 DIAGNOSIS — E0590 Thyrotoxicosis, unspecified without thyrotoxic crisis or storm: Secondary | ICD-10-CM | POA: Diagnosis not present

## 2020-06-13 DIAGNOSIS — E871 Hypo-osmolality and hyponatremia: Secondary | ICD-10-CM | POA: Diagnosis not present

## 2020-06-13 DIAGNOSIS — K851 Biliary acute pancreatitis without necrosis or infection: Secondary | ICD-10-CM | POA: Diagnosis not present

## 2020-06-13 DIAGNOSIS — N179 Acute kidney failure, unspecified: Secondary | ICD-10-CM | POA: Diagnosis not present

## 2020-06-13 DIAGNOSIS — K5900 Constipation, unspecified: Secondary | ICD-10-CM

## 2020-06-13 DIAGNOSIS — E876 Hypokalemia: Secondary | ICD-10-CM | POA: Diagnosis not present

## 2020-06-13 DIAGNOSIS — O99281 Endocrine, nutritional and metabolic diseases complicating pregnancy, first trimester: Secondary | ICD-10-CM | POA: Diagnosis not present

## 2020-06-13 LAB — COMPREHENSIVE METABOLIC PANEL
ALT: 140 U/L — ABNORMAL HIGH (ref 0–44)
AST: 111 U/L — ABNORMAL HIGH (ref 15–41)
Albumin: 2.1 g/dL — ABNORMAL LOW (ref 3.5–5.0)
Alkaline Phosphatase: 70 U/L (ref 38–126)
Anion gap: 10 (ref 5–15)
BUN: 25 mg/dL — ABNORMAL HIGH (ref 6–20)
CO2: 26 mmol/L (ref 22–32)
Calcium: 8.9 mg/dL (ref 8.9–10.3)
Chloride: 100 mmol/L (ref 98–111)
Creatinine, Ser: 1.37 mg/dL — ABNORMAL HIGH (ref 0.44–1.00)
GFR, Estimated: 53 mL/min — ABNORMAL LOW (ref >60)
Glucose, Bld: 115 mg/dL — ABNORMAL HIGH (ref 70–99)
Potassium: 3.3 mmol/L — ABNORMAL LOW (ref 3.5–5.1)
Sodium: 136 mmol/L (ref 135–145)
Total Bilirubin: 0.9 mg/dL (ref 0.3–1.2)
Total Protein: 5.6 g/dL — ABNORMAL LOW (ref 6.5–8.1)

## 2020-06-13 LAB — CBC
HCT: 25.6 % — ABNORMAL LOW (ref 36.0–46.0)
Hemoglobin: 8.3 g/dL — ABNORMAL LOW (ref 12.0–15.0)
MCH: 27.4 pg (ref 26.0–34.0)
MCHC: 32.4 g/dL (ref 30.0–36.0)
MCV: 84.5 fL (ref 80.0–100.0)
Platelets: 213 10*3/uL (ref 150–400)
RBC: 3.03 MIL/uL — ABNORMAL LOW (ref 3.87–5.11)
RDW: 12.5 % (ref 11.5–15.5)
WBC: 10.1 10*3/uL (ref 4.0–10.5)
nRBC: 0 % (ref 0.0–0.2)

## 2020-06-13 LAB — MAGNESIUM: Magnesium: 1.4 mg/dL — ABNORMAL LOW (ref 1.7–2.4)

## 2020-06-13 LAB — URINE CULTURE

## 2020-06-13 MED ORDER — POTASSIUM CHLORIDE 10 MEQ/100ML IV SOLN: 10.0000 meq | INTRAVENOUS | Status: DC

## 2020-06-13 MED ORDER — BISACODYL 10 MG RE SUPP
10.0000 mg | Freq: Once | RECTAL | Status: AC
  Administered 2020-06-13: 10 mg via RECTAL
  Filled 2020-06-13: qty 1

## 2020-06-13 MED ORDER — MAGNESIUM SULFATE 4 GM/100ML IV SOLN
4.0000 g | Freq: Once | INTRAVENOUS | Status: AC
  Administered 2020-06-13: 4 g via INTRAVENOUS
  Filled 2020-06-13: qty 100

## 2020-06-13 MED ORDER — POTASSIUM CHLORIDE 10 MEQ/100ML IV SOLN
10.0000 meq | INTRAVENOUS | Status: AC
  Administered 2020-06-13 (×3): 10 meq via INTRAVENOUS
  Filled 2020-06-13 (×4): qty 100

## 2020-06-13 MED ORDER — POLYETHYLENE GLYCOL 3350 17 G PO PACK
17.0000 g | PACK | Freq: Every day | ORAL | Status: DC
  Filled 2020-06-13 (×2): qty 1

## 2020-06-13 MED ORDER — FAMOTIDINE 20 MG PO TABS
20.0000 mg | ORAL_TABLET | Freq: Two times a day (BID) | ORAL | Status: DC
  Administered 2020-06-13 – 2020-06-15 (×4): 20 mg via ORAL
  Filled 2020-06-13 (×6): qty 1

## 2020-06-13 MED ORDER — MAGNESIUM CITRATE PO SOLN: 1.0000 | Freq: Once | ORAL | Status: DC

## 2020-06-13 MED ORDER — POTASSIUM CHLORIDE 20 MEQ PO PACK
40.0000 meq | PACK | Freq: Once | ORAL | Status: DC
  Filled 2020-06-13: qty 2

## 2020-06-13 MED ORDER — POTASSIUM CHLORIDE 10 MEQ/100ML IV SOLN
10.0000 meq | Freq: Once | INTRAVENOUS | Status: AC
  Administered 2020-06-13: 10 meq via INTRAVENOUS

## 2020-06-13 MED ORDER — DOCUSATE SODIUM 100 MG PO CAPS
100.0000 mg | ORAL_CAPSULE | Freq: Two times a day (BID) | ORAL | Status: DC
  Administered 2020-06-13 – 2020-06-15 (×4): 100 mg via ORAL
  Filled 2020-06-13 (×5): qty 1

## 2020-06-13 NOTE — Progress Notes (Signed)
PROGRESS NOTE   Shaily Librizzi  OJJ:[id number]    DOB: 31-Oct-1990    DOA: 06/09/2020  PCP: Patient, No Pcp Per (Inactive)   I have briefly reviewed patients previous medical records in [facility]  Chief Complaint  Patient presents with  . Fatigue  . Nausea    Brief Narrative:  30 year old female with medical history significant for but not limited to 13 weeks and 6 days pregnant at time of admission, chronic hypertension, obesity, presented with complaints of abdominal pain with nausea, vomiting and poor oral intake for the last month.  Vomiting both after eating solids or drinking liquids.  Reported 14 kg weight loss over the last month and a half prior to admission.  Also noted right upper quadrant abdominal pain worse after eating.  She presented initially to the maternity ward for work-up, evaluated by nurse midwife, found to have AKI, hypokalemia, abnormal LFTs, elevated lipase, RUQ ultrasound revealed distended gallbladder with sludge and wall thickening.  Admitted for acute gallstone pancreatitis with gallbladder sludge and AKI.  General surgery consulted, followed closely and signed off eventually on 6/1 with plans for outpatient follow-up.  OB continue to follow.   Assessment & Plan:  Active Problems:   HYPERTENSION, BENIGN ESSENTIAL   Acute gallstone pancreatitis   Hyperthyroidism   Hypokalemia   Hyponatremia   Abnormal LFTs   AKI (acute kidney injury) ([place])   Acute biliary pancreatitis with gallbladder sludge: General surgery was consulted.  They indicate equivocal signs of cholecystitis on ultrasound, no gallstones, + sludge/borderline wall thickening, no fever, no leukocytosis but did have right-sided tenderness.  Treated supportively with bowel rest, IV fluids and gradually advanced diet which she is tolerating.  LFTs continue to improve.  Lipase also improved but not checked today, will follow-up in a.m.  Surgery does not recommend laparoscopic cholecystectomy at this  time, have started and recommend continuing ursodiol at this time, plan to follow-up in office in 1 month and if patient requires cholecystectomy prior to delivery, then per their advice, second trimester is the safest time to do this.  However if she is doing well on heart healthy diet and ursodiol on follow-up appointment, would likely recommend delaying cholecystectomy until after childbirth.  Constipation: Patient reports no BM for almost a month.  Passing flatus.  When seen this morning, reluctant to start laxatives.  Subsequently agreeable and OB has started MiraLAX and Colace.  Also initiated Dulcolax rectally x1 dose.  Follow closely.  KUB shows moderate colonic stool burden without enteric obstruction.  Acute kidney injury: Likely related to dehydration from biliary issues.  Continues to improve, continue IV fluids and trend daily BMP.  Avoid nephrotoxic's.  Hypokalemia, persistent: Patient not tolerating oral supplements.  IV potassium replacement and follow BMP in AM.  Hypokalemia: Replace IV and follow.  Anemia: Stable.  Hyperemesis gravidarum: Seems to have improved.  Management per OB.  Trichomonas vaginalis: Complete 7 days course of metronidazole per OB.  Intrauterine pregnancy at approximately 14 weeks: OB continue to see in the hospital.  Hypothyroid: Continue methimazole.  No clinical features of hyperthyroidism at this time.  Hypertension: Continue labetalol 100 mg twice daily.  Body mass index is 40.72 kg/m.    DVT prophylaxis: Place TED hose Start: 06/10/20 0857     Code Status: Full Code Family Communication: None at bedside Disposition:  Status is: Inpatient  Remains inpatient appropriate because:Inpatient level of care appropriate due to severity of illness   Dispo: The patient is from: Home  Anticipated d/c is to: Home              Patient currently is not medically stable to d/c.        Consultants:   General  surgery OB  Procedures:   None.  Antimicrobials:    Anti-infectives (From admission, onward)   Start     Dose/Rate Route Frequency Ordered Stop   06/12/20 1415  metroNIDAZOLE (FLAGYL) tablet 500 mg        500 mg Oral Every 12 hours 06/12/20 1317 06/19/20 0959        Subjective:  Seen this morning.  Reports abdominal pain somewhat worse than yesterday.  Feels that this is due to constipation.  Indicates that she has not had a BM in about 2 months time.  Passing flatus.  Unwilling to start a bowel regimen/laxatives at that time but willing to consider.  No nausea or vomiting.  Tolerating diet.  Objective:   Vitals:   06/12/20 2229 06/13/20 0449 06/13/20 1049 06/13/20 1504  BP: 137/67 (!) 144/73 122/72 129/80  Pulse: (!) 106 90 85 (!) 104  Resp: 20 18 20 16   Temp: 98.7 F (37.1 C) 98.2 F (36.8 C) 98 F (36.7 C) 97.9 F (36.6 C)  TempSrc: Oral Oral Oral Oral  SpO2: 99% 100% 100% 100%  Weight:      Height:        General exam: Pleasant young female, moderately built and morbidly obese, sitting up comfortably in reclining chair this morning. Respiratory system: Clear to auscultation. Respiratory effort normal. Cardiovascular system: S1 & S2 heard, RRR. No JVD, murmurs, rubs, gallops or clicks.  Trace bilateral ankle edema. Gastrointestinal system: Abdomen is nondistended, soft and nontender. No organomegaly or masses felt. Normal bowel sounds heard. Central nervous system: Alert and oriented. No focal neurological deficits. Extremities: Symmetric 5 x 5 power. Skin: No rashes, lesions or ulcers Psychiatry: Judgement and insight appear normal. Mood & affect appropriate.     Data Reviewed:   I have personally reviewed following labs and imaging studies   CBC: Recent Labs  Lab 06/11/20 0032 06/12/20 0048 06/13/20 0125  WBC 6.9 7.9 10.1  HGB 8.9* 7.9* 8.3*  HCT 26.6* 23.9* 25.6*  MCV 81.1 83.3 84.5  PLT 180 177 163    Basic Metabolic Panel: Recent Labs  Lab  06/11/20 0032 06/12/20 0048 06/13/20 0125  NA 129* 134* 136  K 2.9* 3.0* 3.3*  CL 90* 94* 100  CO2 26 30 26   GLUCOSE 85 126* 115*  BUN 47* 36* 25*  CREATININE 1.94* 1.59* 1.37*  CALCIUM 8.8* 8.8* 8.9  MG 1.7 1.5* 1.4*    Liver Function Tests: Recent Labs  Lab 06/11/20 0032 06/12/20 0048 06/13/20 0125  AST 202* 146* 111*  ALT 210* 158* 140*  ALKPHOS 57 82 70  BILITOT 1.6* 1.0 0.9  PROT 5.4* 5.1* 5.6*  ALBUMIN 2.2* 2.0* 2.1*    CBG: No results for input(s): GLUCAP in the last 168 hours.  Microbiology Studies:   Recent Results (from the past 240 hour(s))  Wet prep, genital     Status: Abnormal   Collection Time: 06/10/20  2:34 AM   Specimen: Genital  Result Value Ref Range Status   Yeast Wet Prep HPF POC NONE SEEN NONE SEEN Final   Trich, Wet Prep PRESENT (A) NONE SEEN Final   Clue Cells Wet Prep HPF POC PRESENT (A) NONE SEEN Final   WBC, Wet Prep HPF POC FEW (A) NONE SEEN Final  Sperm NONE SEEN  Final    Comment: Performed at [facility], [place] [address] [place], [place] [address]  Culture, Urine     Status: Abnormal   Collection Time: 06/10/20  6:11 AM   Specimen: Urine, Random  Result Value Ref Range Status   Specimen Description URINE, RANDOM  Final   Special Requests   Final    NONE Performed at [facility], [place] [address], [place], [place] [address]    Culture MULTIPLE SPECIES PRESENT, SUGGEST RECOLLECTION (A)  Final   Report Status 06/13/2020 FINAL  Final  Resp Panel by RT-PCR (Flu A&B, Covid) Nasopharyngeal Swab     Status: None   Collection Time: 06/10/20  9:00 AM   Specimen: Nasopharyngeal Swab; Nasopharyngeal(NP) swabs in vial transport medium  Result Value Ref Range Status   SARS Coronavirus 2 by RT PCR NEGATIVE NEGATIVE Final    Comment: (NOTE) SARS-CoV-2 target nucleic acids are NOT DETECTED.  The SARS-CoV-2 RNA is generally detectable in upper respiratory specimens during the acute phase of infection. The  lowest concentration of SARS-CoV-2 viral copies this assay can detect is 138 copies/mL. A negative result does not preclude SARS-Cov-2 infection and should not be used as the sole basis for treatment or other patient management decisions. A negative result may occur with  improper specimen collection/handling, submission of specimen other than nasopharyngeal swab, presence of viral mutation(s) within the areas targeted by this assay, and inadequate number of viral copies(<138 copies/mL). A negative result must be combined with clinical observations, patient history, and epidemiological information. The expected result is Negative.  Fact Sheet for Patients:  EntrepreneurPulse.com.au  Fact Sheet for Healthcare Providers:  IncredibleEmployment.be  This test is no t yet approved or cleared by the [place] FDA and  has been authorized for detection and/or diagnosis of SARS-CoV-2 by FDA under an Emergency Use Authorization (EUA). This EUA will remain  in effect (meaning this test can be used) for the duration of the COVID-19 declaration under Section 564(b)(1) of the Act, 21 U.S.C.section 360bbb-3(b)(1), unless the authorization is terminated  or revoked sooner.       Influenza A by PCR NEGATIVE NEGATIVE Final   Influenza B by PCR NEGATIVE NEGATIVE Final    Comment: (NOTE) The Xpert Xpress SARS-CoV-2/FLU/RSV plus assay is intended as an aid in the diagnosis of influenza from Nasopharyngeal swab specimens and should not be used as a sole basis for treatment. Nasal washings and aspirates are unacceptable for Xpert Xpress SARS-CoV-2/FLU/RSV testing.  Fact Sheet for Patients: EntrepreneurPulse.com.au  Fact Sheet for Healthcare Providers: IncredibleEmployment.be  This test is not yet approved or cleared by the [place] FDA and has been authorized for detection and/or diagnosis of SARS-CoV-2 by FDA under  an Emergency Use Authorization (EUA). This EUA will remain in effect (meaning this test can be used) for the duration of the COVID-19 declaration under Section 564(b)(1) of the Act, 21 U.S.C. section 360bbb-3(b)(1), unless the authorization is terminated or revoked.  Performed at [facility], [place] [address] [place], [place] [address]      Radiology Studies:  DG Abd Portable 1V  Result Date: 06/13/2020 CLINICAL DATA:  Constipation. Nausea and vomiting. Patient is [redacted] weeks pregnant. EXAM: PORTABLE ABDOMEN - 1 VIEW COMPARISON:  None. FINDINGS: Moderate colonic stool burden without evidence of enteric obstruction. Nondiagnostic evaluation for pneumoperitoneum secondary to supine positioning and exclusion of the lower thorax. No pneumatosis or portal venous gas. There is incomplete fusion involving  the posterior elements of L5. No acute osseous abnormalities. IMPRESSION: Moderate colonic stool burden without evidence of enteric obstruction. Electronically Signed   By: Sandi Mariscal M.D.   On: 06/13/2020 10:55     Scheduled Meds:   . aspirin  81 mg Oral Daily  . docusate sodium  100 mg Oral BID  . famotidine  20 mg Oral BID  . labetalol  100 mg Oral BID  . methimazole  5 mg Oral TID  . metroNIDAZOLE  500 mg Oral Q12H  . ondansetron  4 mg Oral Q6H  . polyethylene glycol  17 g Oral Daily  . scopolamine  1 patch Transdermal Q72H  . ursodiol  300 mg Oral TID    Continuous Infusions:   . lactated ringers 100 mL/hr at 06/12/20 2229  . promethazine (PHENERGAN) injection (IM or IVPB) 25 mg (06/13/20 1001)     LOS: 3 days     Vernell Leep, MD, [place], [facility]. [facility]    To contact the attending provider between 7A-7P or the covering provider during after hours 7P-7A, please log into the web site [email or web address] and access using universal [place] password for that web site. If you do not have the password, please call the hospital operator.  06/13/2020, 5:58 PM

## 2020-06-13 NOTE — Progress Notes (Signed)
Pt having increase in lower abdominal pain, with pain radiating to her lower back. Asked pt if she thought it was related to a BM or if it was more in the area of the baby. She said she didn't know. She agreed to try a suppository.  Notified MD and OB. MD placed new orders. OB came to check pt.

## 2020-06-13 NOTE — Progress Notes (Signed)
G1P0 at 14 2/7 weeks on 6N for electrolyte imbalances, constipation and today, acute abdominal pain.  US performed shows appropriate gestational development.  Dr Edythe Clarity requested doppler qd for patient comfort.  Arrived at [facility].  Patient sitting on commode attempting to have BM.  Patient denied attempt to doppler FHT's.  Too uncomfortable in attempt to have BM. Call RROB RN at [phone] when patient is ready to be dopplered.  Pearletha Forge RNC-OB

## 2020-06-13 NOTE — Progress Notes (Signed)
Patient requests OBRR RN to bedside to assess FHT via doppler. Doppler reading of 134.

## 2020-06-13 NOTE — Progress Notes (Signed)
Changed peripheral IV dressing. It was coming loose around the edges and had been previously reinforced

## 2020-06-13 NOTE — Progress Notes (Signed)
OBGYN note  Over to see patient, per RN, having more pain.   Patient reports slightly increased pain this afternoon, thinks it is because she has not had a BM in several weeks. Has had miralax, colace. Now ordered for suppository. Denies cramping, vaginal bleeding or leaking.  BP 129/80 (BP Location: Left Wrist)   Pulse (!) 104   Temp 97.9 F (36.6 C) (Oral)   Resp 16   Ht 5\' 7"  (1.702 m)   Wt 117.9 kg   LMP 03/05/2020   SpO2 100%   BMI 40.72 kg/m  Gen: alert, oriented Abd: soft, diffusely tender  G1P0@ [redacted]w[redacted]d, worsening abdominal pain this afternoon. Suspect this is due to lack of BM recently. AXR today with no evidence of obstruction and patient has been passing gas. Ddx also could include pain due to fibroid, which is large and exophytic. Reviewed with patient that we would need to rule out other sources before attributing this pain to fibroids, possibly due to pregnancy but less likely given no cramping/bleeding/leaking. Will obtain FHR by doppler for reassurance and see if she is able to get relief/have BM with suppository.   Patient agreeable to plan.  RROB aware of need for doppler.   Feliz Beam, MD, [place] for [employer] [facility])

## 2020-06-13 NOTE — Progress Notes (Signed)
FACULTY PRACTICE ANTEPARTUM PROGRESS NOTE  Stacy Fowler is a 30 y.o. G1P0 at [redacted]w[redacted]d who is admitted for emesis and deranged electrolytes.  Estimated Date of Delivery: 12/10/20 Fetal presentation is unsure.  Length of Stay:  3 Days. Admitted 06/09/2020  Subjective: Patient reports increased pain in abdomen, different from RUQ pain. Has not had BM in one month. Is passing flatus. Denies vaginal leaking/bleeding. RUQ still tender but improved overall. She is tolerating PO much better today.   Vitals:  Blood pressure 122/72, pulse 85, temperature 98 F (36.7 C), temperature source Oral, resp. rate 20, height 5\' 7"  (1.702 m), weight 117.9 kg, last menstrual period 03/05/2020, SpO2 100 %. Physical Examination: CONSTITUTIONAL: Well-developed, well-nourished female in no acute distress.  SKIN: Skin is warm and dry. No rash noted. Not diaphoretic. No erythema. No pallor. [place]: Alert and oriented to person, place, and time. Normal reflexes, muscle tone coordination. No cranial nerve deficit noted. PSYCHIATRIC: Normal mood and affect. Normal behavior. Normal judgment and thought content. CARDIOVASCULAR: Normal heart rate noted RESPIRATORY: Effort normal, no problems with respiration noted MUSCULOSKELETAL: Normal range of motion. No edema and no tenderness. ABDOMEN: Soft, slightly distended, mildly tender diffusely CERVIX: deferred  Results for orders placed or performed during the hospital encounter of 06/09/20 (from the past 48 hour(s))  Magnesium     Status: Abnormal   Collection Time: 06/12/20 12:48 AM  Result Value Ref Range   Magnesium 1.5 (L) 1.7 - 2.4 mg/dL    Comment: Performed at [facility], [address], [place], [place] [address]  Comprehensive metabolic panel     Status: Abnormal   Collection Time: 06/12/20 12:48 AM  Result Value Ref Range   Sodium 134 (L) 135 - 145 mmol/L   Potassium 3.0 (L) 3.5 - 5.1 mmol/L   Chloride 94 (L) 98 - 111 mmol/L   CO2 30 22 - 32 mmol/L    Glucose, Bld 126 (H) 70 - 99 mg/dL    Comment: Glucose reference range applies only to samples taken after fasting for at least 8 hours.   BUN 36 (H) 6 - 20 mg/dL   Creatinine, Ser 1.59 (H) 0.44 - 1.00 mg/dL   Calcium 8.8 (L) 8.9 - 10.3 mg/dL   Total Protein 5.1 (L) 6.5 - 8.1 g/dL   Albumin 2.0 (L) 3.5 - 5.0 g/dL   AST 146 (H) 15 - 41 U/L   ALT 158 (H) 0 - 44 U/L   Alkaline Phosphatase 82 38 - 126 U/L   Total Bilirubin 1.0 0.3 - 1.2 mg/dL   GFR, Estimated 44 (L) >60 mL/min    Comment: (NOTE) Calculated using the CKD-EPI Creatinine Equation (2021)    Anion gap 10 5 - 15    Comment: Performed at [facility], [place] [address] [place], [place] [address]  Lipase, blood     Status: Abnormal   Collection Time: 06/12/20 12:48 AM  Result Value Ref Range   Lipase 198 (H) 11 - 51 U/L    Comment: Performed at [facility], [place] [address], [place], [place] [address]  CBC     Status: Abnormal   Collection Time: 06/12/20 12:48 AM  Result Value Ref Range   WBC 7.9 4.0 - 10.5 K/uL   RBC 2.87 (L) 3.87 - 5.11 MIL/uL   Hemoglobin 7.9 (L) 12.0 - 15.0 g/dL   HCT 23.9 (L) 36.0 - 46.0 %   MCV 83.3 80.0 - 100.0 fL   MCH 27.5 26.0 -  34.0 pg   MCHC 33.1 30.0 - 36.0 g/dL   RDW 12.4 11.5 - 15.5 %   Platelets 177 150 - 400 K/uL   nRBC 0.3 (H) 0.0 - 0.2 %    Comment: Performed at [place] [address], [place], [place] [address]  Magnesium     Status: Abnormal   Collection Time: 06/13/20  1:25 AM  Result Value Ref Range   Magnesium 1.4 (L) 1.7 - 2.4 mg/dL    Comment: Performed at [place] [address] [place], [place] [address]  CBC     Status: Abnormal   Collection Time: 06/13/20  1:25 AM  Result Value Ref Range   WBC 10.1 4.0 - 10.5 K/uL   RBC 3.03 (L) 3.87 - 5.11 MIL/uL   Hemoglobin 8.3 (L) 12.0 - 15.0 g/dL   HCT 25.6 (L) 36.0 - 46.0 %   MCV 84.5 80.0 - 100.0 fL   MCH 27.4 26.0 - 34.0 pg   MCHC 32.4 30.0 - 36.0 g/dL   RDW 12.5 11.5 - 15.5 %   Platelets  213 150 - 400 K/uL   nRBC 0.0 0.0 - 0.2 %    Comment: Performed at [facility], [place] [address], [place], [place] [address]  Comprehensive metabolic panel     Status: Abnormal   Collection Time: 06/13/20  1:25 AM  Result Value Ref Range   Sodium 136 135 - 145 mmol/L   Potassium 3.3 (L) 3.5 - 5.1 mmol/L   Chloride 100 98 - 111 mmol/L   CO2 26 22 - 32 mmol/L   Glucose, Bld 115 (H) 70 - 99 mg/dL    Comment: Glucose reference range applies only to samples taken after fasting for at least 8 hours.   BUN 25 (H) 6 - 20 mg/dL   Creatinine, Ser 1.37 (H) 0.44 - 1.00 mg/dL   Calcium 8.9 8.9 - 10.3 mg/dL   Total Protein 5.6 (L) 6.5 - 8.1 g/dL   Albumin 2.1 (L) 3.5 - 5.0 g/dL   AST 111 (H) 15 - 41 U/L   ALT 140 (H) 0 - 44 U/L   Alkaline Phosphatase 70 38 - 126 U/L   Total Bilirubin 0.9 0.3 - 1.2 mg/dL   GFR, Estimated 53 (L) >60 mL/min    Comment: (NOTE) Calculated using the CKD-EPI Creatinine Equation (2021)    Anion gap 10 5 - 15    Comment: Performed at [facility], [place] [address] [place], [place] [address]    I have reviewed the patient's current medications.  ASSESSMENT: Active Problems:   HYPERTENSION, BENIGN ESSENTIAL   Acute gallstone pancreatitis   Hyperthyroidism   Hypokalemia   Hyponatremia   Abnormal LFTs   AKI (acute kidney injury) ([place])   PLAN: Thyroid - cont methimazole - f/u outpatient with OB  Abdominal pain/lack of BM - moderate stool burden without evidence of obstruction - cont miralax/mag citrate  Hyperemesis - tolerating PO  Trichomonas - day 2/7 flagyl - reviewed with patient need for complete course, partner will need to be treated and they need to abstain until both have been treated  HTN - started on labetalol 100 mg BID, titrate prn  RUQ pain - f/u as outpatient with gen surg - AST/ALT improving  AKI - improvin   Electrolyte abnormalities - improving - cont repleting prn   Pregnancy - cont baby ASA - will  need close follow up with OB once discharged  Thank you for this consult, will follow along.   Continue routine antenatal care.   Feliz Beam, MD, [place] for [employer] (Faculty Practice)  06/13/2020 11:13 AM

## 2020-06-13 NOTE — Progress Notes (Addendum)
Progress Note     Subjective: Patient tolerating HH diet without emesis. Pain not worsened with eating. Patient reports to me today that she is passing flatus but has not had a BM in 1 month.   Objective: Vital signs in last 24 hours: Temp:  [97.6 F (36.4 C)-98.7 F (37.1 C)] 98.2 F (36.8 C) (06/01 0449) Pulse Rate:  [90-106] 90 (06/01 0449) Resp:  [18-20] 18 (06/01 0449) BP: (118-144)/(60-73) 144/73 (06/01 0449) SpO2:  [98 %-100 %] 100 % (06/01 0449) Last BM Date:  (more than a month ago)  Intake/Output from previous day: 05/31 0701 - 06/01 0700 In: 200 [P.O.:200] Out: -  Intake/Output this shift: No intake/output data recorded.  PE: General: pleasant, WD, obese female who is sitting up in NAD HEENT: Sclera are anicteric.  Heart: regular, rate, and rhythm.   Lungs: Respiratory effort nonlabored Abd: soft, mild diffuse ttp without peritonitis, ND, +BS, no masses, hernias, or organomegaly MS: all 4 extremities are symmetrical with no cyanosis, clubbing, or edema. Skin: warm and dry with no masses, lesions, or rashes Psych: A&Ox3 with an appropriate affect.    Lab Results:  Recent Labs    06/12/20 0048 06/13/20 0125  WBC 7.9 10.1  HGB 7.9* 8.3*  HCT 23.9* 25.6*  PLT 177 213   BMET Recent Labs    06/12/20 0048 06/13/20 0125  NA 134* 136  K 3.0* 3.3*  CL 94* 100  CO2 30 26  GLUCOSE 126* 115*  BUN 36* 25*  CREATININE 1.59* 1.37*  CALCIUM 8.8* 8.9   PT/INR No results for input(s): LABPROT, INR in the last 72 hours. CMP     Component Value Date/Time   NA 136 06/13/2020 0125   K 3.3 (L) 06/13/2020 0125   CL 100 06/13/2020 0125   CO2 26 06/13/2020 0125   GLUCOSE 115 (H) 06/13/2020 0125   BUN 25 (H) 06/13/2020 0125   CREATININE 1.37 (H) 06/13/2020 0125   CALCIUM 8.9 06/13/2020 0125   PROT 5.6 (L) 06/13/2020 0125   ALBUMIN 2.1 (L) 06/13/2020 0125   AST 111 (H) 06/13/2020 0125   ALT 140 (H) 06/13/2020 0125   ALKPHOS 70 06/13/2020 0125   BILITOT  0.9 06/13/2020 0125   GFRNONAA 53 (L) 06/13/2020 0125   Lipase     Component Value Date/Time   LIPASE 198 (H) 06/12/2020 0048       Studies/Results: No results found.  Anti-infectives: Anti-infectives (From admission, onward)   Start     Dose/Rate Route Frequency Ordered Stop   06/12/20 1415  metroNIDAZOLE (FLAGYL) tablet 500 mg        500 mg Oral Every 12 hours 06/12/20 1317 06/19/20 0959       Assessment/Plan 13W Intrauterine pregnancy with suspected hyperemesis gravidarum Dehydration with AKI - Cr 1.37 today, improving Electrolyte derangements - replacement per medicine Trichomonas infection - per medicine ?UTI - per medicine Anemia, unspecified - hgb 8.3 this AM, stable Abnormal thyroid function - tsh 0.010 Morbid Obesity - BMI 40.72 Constipation - pt reports no BM for 1 month, KUB today shows moderate stool burden, mag citrate already ordered for pt, added colace and miralax  Elevated lipase with elevated LFTs and biliary sludge - equivocal signs of cholecystitis on u/s, no gallstones, +sludge/boderline wall thickening. no fever. No wbc but does have right sided tenderness - lfts improving, lipase was trending back down towards normal - tolerating ursodiol and HH diet - would not recommend laparoscopic cholecystectomy at this time, continue ursodiol  on discharge. Will work on arranging follow up in [place] office in 1 month - if patient requires cholecystectomy prior to delivery, then 2nd trimester is the safest time to do this. If she is doing well on HH diet and ursodiol at follow up appointment would likely recommend delaying cholecystectomy until after patient delivers baby - general surgery will sign off at this time. Please call if we can be of further assistance  FEN: HH diet VTE: SCDs ID: PO methimazole 5/30>>  LOS: 3 days    Norm Parcel, [facility] 06/13/2020, 9:58 AM Please see Amion for pager number during day hours  7:00am-4:30pm

## 2020-06-14 DIAGNOSIS — E039 Hypothyroidism, unspecified: Secondary | ICD-10-CM

## 2020-06-14 DIAGNOSIS — O10012 Pre-existing essential hypertension complicating pregnancy, second trimester: Secondary | ICD-10-CM | POA: Diagnosis not present

## 2020-06-14 DIAGNOSIS — O26832 Pregnancy related renal disease, second trimester: Secondary | ICD-10-CM

## 2020-06-14 DIAGNOSIS — N179 Acute kidney failure, unspecified: Secondary | ICD-10-CM | POA: Diagnosis not present

## 2020-06-14 DIAGNOSIS — O99282 Endocrine, nutritional and metabolic diseases complicating pregnancy, second trimester: Secondary | ICD-10-CM | POA: Diagnosis not present

## 2020-06-14 DIAGNOSIS — K851 Biliary acute pancreatitis without necrosis or infection: Secondary | ICD-10-CM | POA: Diagnosis not present

## 2020-06-14 DIAGNOSIS — O99612 Diseases of the digestive system complicating pregnancy, second trimester: Secondary | ICD-10-CM | POA: Diagnosis not present

## 2020-06-14 DIAGNOSIS — K5900 Constipation, unspecified: Secondary | ICD-10-CM | POA: Diagnosis not present

## 2020-06-14 LAB — MAGNESIUM
Magnesium: 1.3 mg/dL — ABNORMAL LOW (ref 1.7–2.4)
Magnesium: 1.8 mg/dL (ref 1.7–2.4)

## 2020-06-14 LAB — CBC
HCT: 23.1 % — ABNORMAL LOW (ref 36.0–46.0)
Hemoglobin: 7.4 g/dL — ABNORMAL LOW (ref 12.0–15.0)
MCH: 27 pg (ref 26.0–34.0)
MCHC: 32 g/dL (ref 30.0–36.0)
MCV: 84.3 fL (ref 80.0–100.0)
Platelets: 203 10*3/uL (ref 150–400)
RBC: 2.74 MIL/uL — ABNORMAL LOW (ref 3.87–5.11)
RDW: 12.9 % (ref 11.5–15.5)
WBC: 9.2 10*3/uL (ref 4.0–10.5)
nRBC: 0 % (ref 0.0–0.2)

## 2020-06-14 LAB — COMPREHENSIVE METABOLIC PANEL
ALT: 102 U/L — ABNORMAL HIGH (ref 0–44)
AST: 84 U/L — ABNORMAL HIGH (ref 15–41)
Albumin: 1.8 g/dL — ABNORMAL LOW (ref 3.5–5.0)
Alkaline Phosphatase: 59 U/L (ref 38–126)
Anion gap: 6 (ref 5–15)
BUN: 15 mg/dL (ref 6–20)
CO2: 27 mmol/L (ref 22–32)
Calcium: 8.6 mg/dL — ABNORMAL LOW (ref 8.9–10.3)
Chloride: 105 mmol/L (ref 98–111)
Creatinine, Ser: 0.96 mg/dL (ref 0.44–1.00)
GFR, Estimated: >60 mL/min (ref >60)
Glucose, Bld: 112 mg/dL — ABNORMAL HIGH (ref 70–99)
Potassium: 3.2 mmol/L — ABNORMAL LOW (ref 3.5–5.1)
Sodium: 138 mmol/L (ref 135–145)
Total Bilirubin: 0.7 mg/dL (ref 0.3–1.2)
Total Protein: 4.9 g/dL — ABNORMAL LOW (ref 6.5–8.1)

## 2020-06-14 LAB — BASIC METABOLIC PANEL
Anion gap: 6 (ref 5–15)
BUN: 12 mg/dL (ref 6–20)
CO2: 25 mmol/L (ref 22–32)
Calcium: 8.7 mg/dL — ABNORMAL LOW (ref 8.9–10.3)
Chloride: 105 mmol/L (ref 98–111)
Creatinine, Ser: 1.03 mg/dL — ABNORMAL HIGH (ref 0.44–1.00)
GFR, Estimated: >60 mL/min (ref >60)
Glucose, Bld: 101 mg/dL — ABNORMAL HIGH (ref 70–99)
Potassium: 3.8 mmol/L (ref 3.5–5.1)
Sodium: 136 mmol/L (ref 135–145)

## 2020-06-14 LAB — LIPASE, BLOOD: Lipase: 99 U/L — ABNORMAL HIGH (ref 11–51)

## 2020-06-14 MED ORDER — ONDANSETRON 4 MG PO TBDP: 4.0000 mg | ORAL_TABLET | Freq: Four times a day (QID) | ORAL | Status: DC | PRN

## 2020-06-14 MED ORDER — MAGNESIUM SULFATE 4 GM/100ML IV SOLN
4.0000 g | Freq: Once | INTRAVENOUS | Status: AC
  Administered 2020-06-14: 4 g via INTRAVENOUS
  Filled 2020-06-14: qty 100

## 2020-06-14 MED ORDER — POTASSIUM CHLORIDE 10 MEQ/100ML IV SOLN
10.0000 meq | INTRAVENOUS | Status: AC
  Administered 2020-06-14 (×6): 10 meq via INTRAVENOUS
  Filled 2020-06-14 (×6): qty 100

## 2020-06-14 MED ORDER — SODIUM CHLORIDE 0.9 % IV SOLN
25.0000 mg | Freq: Four times a day (QID) | INTRAVENOUS | Status: DC | PRN
  Filled 2020-06-14: qty 1

## 2020-06-14 NOTE — Progress Notes (Addendum)
OBGYN PN  S: Patient sitting up in bed.  She notes that she did have a BM and notes feeling better.  She is still noting some lower abdominal pain, but less than previously.  Reports that she is tolerating po diet.  Denies nausea/vomiting currently. Denies acute complaint this am.  O: BP 135/74 (BP Location: Left Arm)   Pulse 99   Temp 98.5 F (36.9 C) (Oral)   Resp 18   Ht 5\' 7"  (1.702 m)   Wt 117.9 kg   LMP 03/05/2020   SpO2 100%   BMI 40.72 kg/m   Gen: NAD Mental status: mood appropriate CV: RRR Lungs: CTAB Abd: obese, soft, no reproducible pain Ext: no edema, no calf tenderness bilaterally  Results for orders placed or performed during the hospital encounter of 06/09/20 (from the past 24 hour(s))  Magnesium     Status: Abnormal   Collection Time: 06/14/20  1:16 AM  Result Value Ref Range   Magnesium 1.3 (L) 1.7 - 2.4 mg/dL  CBC     Status: Abnormal   Collection Time: 06/14/20  1:16 AM  Result Value Ref Range   WBC 9.2 4.0 - 10.5 K/uL   RBC 2.74 (L) 3.87 - 5.11 MIL/uL   Hemoglobin 7.4 (L) 12.0 - 15.0 g/dL   HCT 23.1 (L) 36.0 - 46.0 %   MCV 84.3 80.0 - 100.0 fL   MCH 27.0 26.0 - 34.0 pg   MCHC 32.0 30.0 - 36.0 g/dL   RDW 12.9 11.5 - 15.5 %   Platelets 203 150 - 400 K/uL   nRBC 0.0 0.0 - 0.2 %  Comprehensive metabolic panel     Status: Abnormal   Collection Time: 06/14/20  1:16 AM  Result Value Ref Range   Sodium 138 135 - 145 mmol/L   Potassium 3.2 (L) 3.5 - 5.1 mmol/L   Chloride 105 98 - 111 mmol/L   CO2 27 22 - 32 mmol/L   Glucose, Bld 112 (H) 70 - 99 mg/dL   BUN 15 6 - 20 mg/dL   Creatinine, Ser 0.96 0.44 - 1.00 mg/dL   Calcium 8.6 (L) 8.9 - 10.3 mg/dL   Total Protein 4.9 (L) 6.5 - 8.1 g/dL   Albumin 1.8 (L) 3.5 - 5.0 g/dL   AST 84 (H) 15 - 41 U/L   ALT 102 (H) 0 - 44 U/L   Alkaline Phosphatase 59 38 - 126 U/L   Total Bilirubin 0.7 0.3 - 1.2 mg/dL   GFR, Estimated >60 >60 mL/min   Anion gap 6 5 - 15  Lipase, blood     Status: Abnormal   Collection  Time: 06/14/20  1:16 AM  Result Value Ref Range   Lipase 99 (H) 11 - 51 U/L   30yo G1P0@[redacted]w[redacted]d  admitted due to the following:  ASSESSMENT: Active Problems:   HYPERTENSION, BENIGN ESSENTIAL   Acute gallstone pancreatitis   Hyperthyroidism   Hypokalemia   Hyponatremia   Abnormal LFTs   AKI (acute kidney injury) ([place])   PLAN:  Abdominal pain -s/p BM and feeling better -encourage hydration and stool softener daily  Hyperemesis -plan to continue with scop patch and zofran prn - tolerating PO  Trichomoniasis - day 3/7 flagyl -Dr. Rosana Hoes previously reviewed partner treatment and importance of compliance  HTN -BP stable -continue Labetalol 100mg  bid  RUQ pain -gen surgery consult - AST/ALT continues to improve  AKI -continues to improve  Thyroid - cont methimazole - f/u outpatient with OB  Electrolyte abnormalities -  cont repleting prn   Pregnancy - cont baby ASA -[place] completed 5/29- confirmed viability - will need close follow up with OB once discharged.  Pt mentioned that she wanted to establish care with Dr. Charlesetta Garibaldi ([facility]).  Will provide both our office and Dr. Berneta Sages office information for discharge.  DISPO: Continue routine antenatal care, will continue to follow along  Janyth Pupa, DO Attending [place], [place] for [employer], [employer]

## 2020-06-14 NOTE — Plan of Care (Signed)

## 2020-06-14 NOTE — Progress Notes (Signed)
PROGRESS NOTE   Stacy Fowler  SFK:[id number]    DOB: 12/27/1990    DOA: 06/09/2020  PCP: Patient, No Pcp Per (Inactive)   I have briefly reviewed patients previous medical [facility] in [facility].  Chief Complaint  Patient presents with  . Fatigue  . Nausea    Brief Narrative:  30 year old female with medical history significant for but not limited to 13 weeks and 6 days pregnant at time of admission, chronic hypertension, obesity, presented with complaints of abdominal pain with nausea, vomiting and poor oral intake for the last month.  Vomiting both after eating solids or drinking liquids.  Reported 14 kg weight loss over the last month and a half prior to admission.  Also noted right upper quadrant abdominal pain worse after eating.  She presented initially to the maternity ward for work-up, evaluated by nurse midwife, found to have AKI, hypokalemia, abnormal LFTs, elevated lipase, RUQ ultrasound revealed distended gallbladder with sludge and wall thickening.  Admitted for acute gallstone pancreatitis with gallbladder sludge and AKI.  General surgery consulted, followed closely and signed off eventually on 6/1 with plans for outpatient follow-up.  OB continue to follow.   Assessment & Plan:  Active Problems:   HYPERTENSION, BENIGN ESSENTIAL   Acute gallstone pancreatitis   Hyperthyroidism   Hypokalemia   Hyponatremia   Abnormal LFTs   AKI (acute kidney injury) ([place])   Acute biliary pancreatitis with gallbladder sludge: General surgery was consulted.  They indicate equivocal signs of cholecystitis on ultrasound, no gallstones, + sludge/borderline wall thickening, no fever, no leukocytosis but did have right-sided tenderness.  Treated supportively with bowel rest, IV fluids and gradually advanced diet which she is tolerating.  LFTs continue to improve.  Lipase also improved but not checked today, will follow-up in a.m.  Surgery does not recommend laparoscopic cholecystectomy at this  time, have started and recommend continuing ursodiol at this time, plan to follow-up in office in 1 month and if patient requires cholecystectomy prior to delivery, then per their advice, second trimester is the safest time to do this.  However if she is doing well on heart healthy diet and ursodiol on follow-up appointment, would likely recommend delaying cholecystectomy until after childbirth.  Clinically improved.  Tolerating diet.  Transaminitis approaching normal.  Constipation: On 6/1, patient reported not having a BM for a month.  KUB shows moderate colonic stool burden without enteric obstruction.  Initiated bowel regimen including Dulcolax suppository x1 with good effect.  Had multiple BMs in the last 24 hours.  Continue bowel regimen and avoid constipation.  Discussed mobilization.  Acute kidney injury: Likely related to dehydration from biliary issues.  Resolved.  DC IV fluids and encourage oral intake.  Hypokalemia, persistent: Patient not tolerating oral supplements.  Aggressive IV replacement done this morning, follow-up BMP now and replace as needed.  Hypomagnesemia, persistent: Replaced IV this morning, follow lab now and replace as needed.  Anemia: Hemoglobin fluctuating in the 7-8 range in the last 2 days.  7.4 today.?  Dilutional.  No reported bleeding.  Follow CBC in a.m.  Hyperemesis gravidarum: Seems to have improved.  Management per OB.  Patient has not been getting all doses of scheduled Zofran or Phenergan despite which she has not been vomiting.  We will change these medications to as needed in preparation for possible discharge in the next 1 to 2 days.  Trichomonas vaginalis: Complete 7 days course of metronidazole per OB.  Intrauterine pregnancy at approximately 14 weeks: OB continue  to see in the hospital.  Hypothyroid: Continue methimazole.  No clinical features of hyperthyroidism at this time. TSH: <0.010, free T3: 8.8 and free T4: 4.32.  Hypertension: Continue  labetalol 100 mg twice daily.  Body mass index is 40.72 kg/m.    DVT prophylaxis: Place TED hose Start: 06/10/20 0857     Code Status: Full Code Family Communication: None at bedside Disposition:  Status is: Inpatient  Remains inpatient appropriate because:Inpatient level of care appropriate due to severity of illness   Dispo: The patient is from: Home              Anticipated d/c is to: Home              Patient currently is not medically stable to d/c.        Consultants:   General surgery OB  Procedures:   None.  Antimicrobials:    Anti-infectives (From admission, onward)   Start     Dose/Rate Route Frequency Ordered Stop   06/12/20 1415  metroNIDAZOLE (FLAGYL) tablet 500 mg        500 mg Oral Every 12 hours 06/12/20 1317 06/19/20 0959        Subjective:  Patient seen this morning along with her female RN in the room.  Reports feeling better.  Had 4 BM since Dulcolax suppository yesterday, good volume.  Mild intermittent lower abdominal pain but better than yesterday.  Tolerating diet without nausea or vomiting.  Per nursing, has not been using all doses of scheduled Zofran or Phenergan.  Objective:   Vitals:   06/13/20 1504 06/13/20 2104 06/14/20 0539 06/14/20 1425  BP: 129/80 123/77 135/74 (!) 123/58  Pulse: (!) 104 (!) 102 99 88  Resp: 16 18 18 17   Temp: 97.9 F (36.6 C) 98.5 F (36.9 C) 98.5 F (36.9 C) 97.9 F (36.6 C)  TempSrc: Oral Oral Oral Oral  SpO2: 100% 98% 100% 95%  Weight:      Height:        General exam: Pleasant young female, moderately built and morbidly obese, lying comfortably supine in bed.  Oral mucosa moist. Respiratory system: Clear to auscultation.  No increased work of breathing. Cardiovascular system: S1 & S2 heard, RRR. No JVD, murmurs, rubs, gallops or clicks.  Trace bilateral ankle edema. Gastrointestinal system: Abdomen is nondistended, soft and nontender.  No organomegaly or masses appreciated.  Normal bowel sounds  heard. Central nervous system: Alert and oriented. No focal neurological deficits. Extremities: Symmetric 5 x 5 power. Skin: No rashes, lesions or ulcers Psychiatry: Judgement and insight appear normal. Mood & affect appropriate.     Data Reviewed:   I have personally reviewed following labs and imaging studies   CBC: Recent Labs  Lab 06/12/20 0048 06/13/20 0125 06/14/20 0116  WBC 7.9 10.1 9.2  HGB 7.9* 8.3* 7.4*  HCT 23.9* 25.6* 23.1*  MCV 83.3 84.5 84.3  PLT 177 213 086    Basic Metabolic Panel: Recent Labs  Lab 06/12/20 0048 06/13/20 0125 06/14/20 0116  NA 134* 136 138  K 3.0* 3.3* 3.2*  CL 94* 100 105  CO2 30 26 27   GLUCOSE 126* 115* 112*  BUN 36* 25* 15  CREATININE 1.59* 1.37* 0.96  CALCIUM 8.8* 8.9 8.6*  MG 1.5* 1.4* 1.3*    Liver Function Tests: Recent Labs  Lab 06/12/20 0048 06/13/20 0125 06/14/20 0116  AST 146* 111* 84*  ALT 158* 140* 102*  ALKPHOS 82 70 59  BILITOT 1.0 0.9 0.7  PROT 5.1* 5.6* 4.9*  ALBUMIN 2.0* 2.1* 1.8*    CBG: No results for input(s): GLUCAP in the last 168 hours.  Microbiology Studies:   Recent Results (from the past 240 hour(s))  Wet prep, genital     Status: Abnormal   Collection Time: 06/10/20  2:34 AM   Specimen: Genital  Result Value Ref Range Status   Yeast Wet Prep HPF POC NONE SEEN NONE SEEN Final   Trich, Wet Prep PRESENT (A) NONE SEEN Final   Clue Cells Wet Prep HPF POC PRESENT (A) NONE SEEN Final   WBC, Wet Prep HPF POC FEW (A) NONE SEEN Final   Sperm NONE SEEN  Final    Comment: Performed at [facility], [place]. [address], [place], [place] [address]  Culture, Urine     Status: Abnormal   Collection Time: 06/10/20  6:11 AM   Specimen: Urine, Random  Result Value Ref Range Status   Specimen Description URINE, RANDOM  Final   Special Requests   Final    NONE Performed at [facility], [place] [address], [place], [place] [address]    Culture MULTIPLE SPECIES PRESENT, SUGGEST RECOLLECTION (A)   Final   Report Status 06/13/2020 FINAL  Final  Resp Panel by RT-PCR (Flu A&B, Covid) Nasopharyngeal Swab     Status: None   Collection Time: 06/10/20  9:00 AM   Specimen: Nasopharyngeal Swab; Nasopharyngeal(NP) swabs in vial transport medium  Result Value Ref Range Status   SARS Coronavirus 2 by RT PCR NEGATIVE NEGATIVE Final    Comment: (NOTE) SARS-CoV-2 target nucleic acids are NOT DETECTED.  The SARS-CoV-2 RNA is generally detectable in upper respiratory specimens during the acute phase of infection. The lowest concentration of SARS-CoV-2 viral copies this assay can detect is 138 copies/mL. A negative result does not preclude SARS-Cov-2 infection and should not be used as the sole basis for treatment or other patient management decisions. A negative result may occur with  improper specimen collection/handling, submission of specimen other than nasopharyngeal swab, presence of viral mutation(s) within the areas targeted by this assay, and inadequate number of viral copies(<138 copies/mL). A negative result must be combined with clinical observations, patient history, and epidemiological information. The expected result is Negative.  Fact Sheet for Patients:  EntrepreneurPulse.com.au  Fact Sheet for Healthcare Providers:  IncredibleEmployment.be  This test is no t yet approved or cleared by the [place] FDA and  has been authorized for detection and/or diagnosis of SARS-CoV-2 by FDA under an Emergency Use Authorization (EUA). This EUA will remain  in effect (meaning this test can be used) for the duration of the COVID-19 declaration under Section 564(b)(1) of the Act, 21 U.S.C.section 360bbb-3(b)(1), unless the authorization is terminated  or revoked sooner.       Influenza A by PCR NEGATIVE NEGATIVE Final   Influenza B by PCR NEGATIVE NEGATIVE Final    Comment: (NOTE) The Xpert Xpress SARS-CoV-2/FLU/RSV plus assay is intended as an  aid in the diagnosis of influenza from Nasopharyngeal swab specimens and should not be used as a sole basis for treatment. Nasal washings and aspirates are unacceptable for Xpert Xpress SARS-CoV-2/FLU/RSV testing.  Fact Sheet for Patients: EntrepreneurPulse.com.au  Fact Sheet for Healthcare Providers: IncredibleEmployment.be  This test is not yet approved or cleared by the [place] FDA and has been authorized for detection and/or diagnosis of SARS-CoV-2 by FDA under an Emergency Use Authorization (EUA). This EUA will remain in effect (meaning this test can  be used) for the duration of the COVID-19 declaration under Section 564(b)(1) of the Act, 21 U.S.C. section 360bbb-3(b)(1), unless the authorization is terminated or revoked.  Performed at [facility], [place] [address] [place], [place] [address]      Radiology Studies:  DG Abd Portable 1V  Result Date: 06/13/2020 CLINICAL DATA:  Constipation. Nausea and vomiting. Patient is [redacted] weeks pregnant. EXAM: PORTABLE ABDOMEN - 1 VIEW COMPARISON:  None. FINDINGS: Moderate colonic stool burden without evidence of enteric obstruction. Nondiagnostic evaluation for pneumoperitoneum secondary to supine positioning and exclusion of the lower thorax. No pneumatosis or portal venous gas. There is incomplete fusion involving the posterior elements of L5. No acute osseous abnormalities. IMPRESSION: Moderate colonic stool burden without evidence of enteric obstruction. Electronically Signed   By: Sandi Mariscal M.D.   On: 06/13/2020 10:55     Scheduled Meds:   . aspirin  81 mg Oral Daily  . docusate sodium  100 mg Oral BID  . famotidine  20 mg Oral BID  . labetalol  100 mg Oral BID  . methimazole  5 mg Oral TID  . metroNIDAZOLE  500 mg Oral Q12H  . ondansetron  4 mg Oral Q6H  . polyethylene glycol  17 g Oral Daily  . scopolamine  1 patch Transdermal Q72H  . ursodiol  300 mg Oral TID    Continuous  Infusions:   . lactated ringers 100 mL/hr at 06/12/20 2229  . promethazine (PHENERGAN) injection (IM or IVPB) Stopped (06/14/20 0807)     LOS: 4 days     Vernell Leep, MD, [place], [facility]. [facility]    To contact the attending provider between 7A-7P or the covering provider during after hours 7P-7A, please log into the web site [email or web address] and access using universal [place] password for that web site. If you do not have the password, please call the hospital operator.  06/14/2020, 4:51 PM

## 2020-06-14 NOTE — Plan of Care (Signed)

## 2020-06-15 ENCOUNTER — Other Ambulatory Visit (HOSPITAL_COMMUNITY): Payer: Self-pay

## 2020-06-15 DIAGNOSIS — R1011 Right upper quadrant pain: Secondary | ICD-10-CM

## 2020-06-15 DIAGNOSIS — O219 Vomiting of pregnancy, unspecified: Secondary | ICD-10-CM

## 2020-06-15 DIAGNOSIS — K851 Biliary acute pancreatitis without necrosis or infection: Secondary | ICD-10-CM | POA: Diagnosis not present

## 2020-06-15 DIAGNOSIS — N179 Acute kidney failure, unspecified: Secondary | ICD-10-CM | POA: Diagnosis not present

## 2020-06-15 DIAGNOSIS — O209 Hemorrhage in early pregnancy, unspecified: Secondary | ICD-10-CM

## 2020-06-15 LAB — HEPATITIS PANEL, ACUTE
HCV Ab: NONREACTIVE
Hep B C IgM: NONREACTIVE
Hepatitis B Surface Ag: NONREACTIVE

## 2020-06-15 LAB — COMPREHENSIVE METABOLIC PANEL
ALT: 75 U/L — ABNORMAL HIGH (ref 0–44)
AST: 59 U/L — ABNORMAL HIGH (ref 15–41)
Albumin: 1.8 g/dL — ABNORMAL LOW (ref 3.5–5.0)
Alkaline Phosphatase: 54 U/L (ref 38–126)
Anion gap: 6 (ref 5–15)
BUN: 8 mg/dL (ref 6–20)
CO2: 24 mmol/L (ref 22–32)
Calcium: 8.4 mg/dL — ABNORMAL LOW (ref 8.9–10.3)
Chloride: 107 mmol/L (ref 98–111)
Creatinine, Ser: 0.9 mg/dL (ref 0.44–1.00)
GFR, Estimated: >60 mL/min (ref >60)
Glucose, Bld: 81 mg/dL (ref 70–99)
Potassium: 3.5 mmol/L (ref 3.5–5.1)
Sodium: 137 mmol/L (ref 135–145)
Total Bilirubin: 0.6 mg/dL (ref 0.3–1.2)
Total Protein: 4.7 g/dL — ABNORMAL LOW (ref 6.5–8.1)

## 2020-06-15 LAB — CBC
HCT: 23.1 % — ABNORMAL LOW (ref 36.0–46.0)
Hemoglobin: 7.4 g/dL — ABNORMAL LOW (ref 12.0–15.0)
MCH: 27.2 pg (ref 26.0–34.0)
MCHC: 32 g/dL (ref 30.0–36.0)
MCV: 84.9 fL (ref 80.0–100.0)
Platelets: 235 10*3/uL (ref 150–400)
RBC: 2.72 MIL/uL — ABNORMAL LOW (ref 3.87–5.11)
RDW: 13.2 % (ref 11.5–15.5)
WBC: 9.3 10*3/uL (ref 4.0–10.5)
nRBC: 0 % (ref 0.0–0.2)

## 2020-06-15 LAB — MAGNESIUM
Magnesium: 1.5 mg/dL — ABNORMAL LOW (ref 1.7–2.4)
Magnesium: 1.3 mg/dL — ABNORMAL LOW (ref 1.7–2.4)

## 2020-06-15 MED ORDER — ASPIRIN 81 MG PO CHEW
81.0000 mg | CHEWABLE_TABLET | Freq: Every day | ORAL | 0 refills | Status: DC
  Filled 2020-06-15: qty 30, 30d supply, fill #0

## 2020-06-15 MED ORDER — METHIMAZOLE 5 MG PO TABS
5.0000 mg | ORAL_TABLET | Freq: Three times a day (TID) | ORAL | 0 refills | Status: DC
  Filled 2020-06-15: qty 90, 30d supply, fill #0

## 2020-06-15 MED ORDER — METRONIDAZOLE 500 MG PO TABS
500.0000 mg | ORAL_TABLET | Freq: Two times a day (BID) | ORAL | 0 refills | Status: DC
  Filled 2020-06-15: qty 7, 3d supply, fill #0

## 2020-06-15 MED ORDER — SCOPOLAMINE 1 MG/3DAYS TD PT72
1.0000 | MEDICATED_PATCH | TRANSDERMAL | 0 refills | Status: DC
  Filled 2020-06-15: qty 4, 12d supply, fill #0

## 2020-06-15 MED ORDER — FAMOTIDINE 20 MG PO TABS
20.0000 mg | ORAL_TABLET | Freq: Two times a day (BID) | ORAL | 0 refills | Status: DC
  Filled 2020-06-15: qty 60, 30d supply, fill #0

## 2020-06-15 MED ORDER — POTASSIUM CHLORIDE 10 MEQ/100ML IV SOLN
10.0000 meq | INTRAVENOUS | Status: AC
  Administered 2020-06-15 (×3): 10 meq via INTRAVENOUS
  Filled 2020-06-15 (×3): qty 100

## 2020-06-15 MED ORDER — DOCUSATE SODIUM 100 MG PO CAPS
100.0000 mg | ORAL_CAPSULE | Freq: Two times a day (BID) | ORAL | 0 refills | Status: DC
  Filled 2020-06-15: qty 60, 30d supply, fill #0

## 2020-06-15 MED ORDER — MAGNESIUM OXIDE 400 MG PO CAPS
400.0000 mg | ORAL_CAPSULE | Freq: Two times a day (BID) | ORAL | 0 refills | Status: DC
  Filled 2020-06-15: qty 15, 8d supply, fill #0

## 2020-06-15 MED ORDER — MAGNESIUM SULFATE 4 GM/100ML IV SOLN
4.0000 g | Freq: Once | INTRAVENOUS | Status: AC
  Administered 2020-06-15: 4 g via INTRAVENOUS
  Filled 2020-06-15: qty 100

## 2020-06-15 MED ORDER — URSODIOL 300 MG PO CAPS
300.0000 mg | ORAL_CAPSULE | Freq: Three times a day (TID) | ORAL | 0 refills | Status: DC
  Filled 2020-06-15: qty 90, 30d supply, fill #0

## 2020-06-15 MED ORDER — POLYETHYLENE GLYCOL 3350 17 G PO PACK
17.0000 g | PACK | Freq: Every day | ORAL | 0 refills | Status: DC
  Filled 2020-06-15: qty 30, 30d supply, fill #0

## 2020-06-15 NOTE — Discharge Instructions (Signed)
Prenatal Care Providers           [facility] [place] [facility]  [place] [phone]  [facility] [place] @ [place]  [phone]  [facility] [place] @ [place] [phone]  [place] @ [facility]       [address] [phone]            [facility] [place] @ [place]     [phone]          [facility] [place] @ [address] [phone]   [facility] [place] @ [address] Linna Hoff)  [place]   [phone]     [place] [facility]  Phone: [facility]  Phone: [facility] Phone: [phone]  [facility] Phone: [phone]  [facility] Phone: [phone]  [facility] Phone: [phone]  [facility]  Phone: [phone]  Additional discharge instructions  Please get your medications reviewed and adjusted by your Primary MD.  Please request your Primary MD to go over all Hospital Tests and Procedure/Radiological results at the follow up, please get all Hospital records sent to your Prim MD by signing hospital release before you go home.  If you had Pneumonia of Lung problems at the Hospital: Please get a 2 view Chest X ray done in 6-8 weeks after hospital discharge or sooner if instructed by your Primary MD.  If you have Congestive Heart Failure: Please call your Cardiologist or Primary MD anytime you have any of the following symptoms:  1) 3 pound weight gain in 24 hours or 5 pounds in 1 week  2) shortness of breath, with or without a dry hacking cough  3) swelling in the hands, feet or stomach  4) if you have to sleep on extra pillows at night in order to breathe  Follow cardiac low salt diet and  1.5 lit/day fluid restriction.  If you have diabetes Accuchecks 4 times/day, Once in AM empty stomach and then before each meal. Log in all results and show them to your primary doctor at your next visit. If any glucose reading is under 80 or above 300 call your primary MD immediately.  If you have Seizure/Convulsions/Epilepsy: Please do not drive, operate heavy machinery, participate in activities at heights or participate in high speed sports until you have seen by Primary MD or a Neurologist and advised to do so again.  If you had Gastrointestinal Bleeding: Please ask your Primary MD to check a complete blood count within one week of discharge or at your next visit. Your endoscopic/colonoscopic biopsies that are pending at the time of discharge, will also need to followed by your Primary MD.  Get Medicines reviewed and adjusted. Please take all your medications with you for your next visit with your Primary MD  Please request your Primary MD to go over all hospital tests and procedure/radiological results at the follow up, please ask your Primary MD to get all Hospital records sent to his/her office.  If you experience worsening of your admission symptoms, develop shortness of breath, life threatening emergency, suicidal or homicidal thoughts you must seek medical attention immediately by calling 911 or calling your MD immediately  if symptoms less severe.  You must read complete  instructions/literature along with all the possible adverse reactions/side effects for all the Medicines you take and that have been prescribed to you. Take any new Medicines after you have completely understood and accpet all the possible adverse reactions/side effects.   Do not drive or operate heavy machinery when taking Pain medications.   Do not take more than prescribed Pain, Sleep and Anxiety Medications  Special Instructions: If you have smoked or chewed Tobacco  in the last 2 yrs please stop smoking,  stop any regular Alcohol  and or any Recreational drug use.  Wear Seat belts while driving.  Please note You were cared for by a hospitalist during your hospital stay. If you have any questions about your discharge medications or the care you received while you were in the hospital after you are discharged, you can call the unit and asked to speak with the hospitalist on call if the hospitalist that took care of you is not available. Once you are discharged, your primary care physician will handle any further medical issues. Please note that NO REFILLS for any discharge medications will be authorized once you are discharged, as it is imperative that you return to your primary care physician (or establish a relationship with a primary care physician if you do not have one) for your aftercare needs so that they can reassess your need for medications and monitor your lab values.  You can reach the hospitalist office at phone [phone] or fax [phone]   If you do not have a primary care physician, you can call [phone] for a physician referral.

## 2020-06-15 NOTE — Discharge Summary (Signed)
Physician Discharge Summary  Danel Studzinski XQJ:[id number] DOB: 1990/02/03  PCP: Patient, No Pcp Per (Inactive).  Offered to get TOC to help with finding a PCP but patient prefers to follow-up with OB MD at this time.  Admitted from: Home Discharged to: Home  Admit date: 06/09/2020 Discharge date: 06/15/2020  Recommendations for Outpatient Follow-up:    Follow-up Information    Armandina Gemma, MD Follow up on 08/01/2020.   Specialty: General Surgery Why: 9:45am, arrive by 9:15am for paperwork and check in process Contact information: [place] [address] [place] for [employer] at [facility]. Schedule an appointment as soon as possible for a visit.   Specialty: Obstetrics and Gynecology Why: Patient is advised to call the office for an appointment to be seen in 3 to 5 days with repeat labs (CBC, CMP and magnesium). Contact information: [place] [id number] [place] [facility] Follow up.   Contact information: [address] [place] [id number] [place]: None    Equipment/Devices: None    Discharge Condition: Improved and stable.   Code Status: Full Code Diet recommendation:  Discharge Diet Orders (From admission, onward)    Start     Ordered   06/15/20 0000  Diet - low sodium heart healthy        06/15/20 1618           Discharge Diagnoses:  Active Problems:   HYPERTENSION, BENIGN ESSENTIAL   Acute gallstone pancreatitis   Hyperthyroidism   Hypokalemia   Hyponatremia   Abnormal LFTs   AKI (acute kidney injury) ([place])   Brief Summary: 30 year old female with medical history significant for but not limited to 13 weeks and 6 days pregnant at time of admission, chronic hypertension, obesity, presented with complaints of abdominal pain with nausea,  vomiting and poor oral intake for the last month.  Vomiting both after eating solids or drinking liquids.  Reported 14 kg weight loss over the last month and a half prior to admission.  Also noted right upper quadrant abdominal pain worse after eating.  She presented initially to the maternity ward for work-up, evaluated by nurse midwife, found to have AKI, hypokalemia, abnormal LFTs, elevated lipase, RUQ ultrasound revealed distended gallbladder with sludge and wall thickening.  Admitted for acute gallstone pancreatitis with gallbladder sludge and AKI.  General surgery consulted, followed closely and signed off eventually on 6/1 with plans for outpatient follow-up.  OB continue to see her through the course of hospital admission   Assessment & Plan:   Acute biliary pancreatitis with gallbladder sludge: General surgery was consulted.  They indicate equivocal signs of cholecystitis on ultrasound, no gallstones, + sludge/borderline wall thickening, no fever, no leukocytosis but did have right-sided tenderness.  Treated supportively with bowel rest, IV fluids and gradually advanced diet which she is tolerating. Surgery does not recommend laparoscopic cholecystectomy at this time, have started and recommend continuing ursodiol at this time, plan to follow-up in office in 1 month and if patient requires cholecystectomy prior to delivery, then per their advice, second trimester is the safest time to do this.  However if she is doing well on heart healthy diet and ursodiol on follow-up appointment, would likely recommend delaying cholecystectomy until after childbirth.  Clinically  improved.  Lipase decreased. Tolerating diet.  Transaminitis approaching normal.  Close outpatient follow-up  Constipation: On 6/1, patient reported not having a BM for a month.  KUB shows moderate colonic stool burden without enteric obstruction.  Initiated bowel regimen including Dulcolax suppository x1 with good effect.    Now  having regular BMs.  Encouraged continued mobilization and continue bowel regimen at discharge.  Acute kidney injury: Likely related to dehydration from biliary issues.  Resolved after IV fluids.  Hypokalemia, persistent: Patient not tolerating oral supplements.   Replaced with IV runs of potassium on multiple days including this morning.  Discussed with patient and her mother regarding consuming high potassium diets and outlined which they were.  Potassium low normal today.  Follow BMP closely as outpatient next week.  Hypomagnesemia, persistent: IV magnesium repleted on multiple days including on day of discharge.  Oral magnesium supplements at discharge.  Etiology of recurrent hypokalemia and hypomagnesemia uncertain.  Discussed with Dr. Mariane Masters, OB, okay to do oral supplements.  Anemia:  Hemoglobin stable at 7.4 in the last 2 days.  No reported bleeding.  Follow CBC closely next week.  Hyperemesis gravidarum: Improved.  Management per OB.   She was on scheduled doses of Zofran, Phenergan, Pepcid and also had a scopolamine patch on.  This gradually improved.  Antiemetics were changed to as needed with no worsening.  She has not taken Reglan in over a month.  Discharging on short supply of scopolamine patch along with her home dose of as needed Zofran with close outpatient follow-up with OB.  Trichomonas vaginalis: Complete 7 days course of metronidazole per OB.  Intrauterine pregnancy at approximately 14 weeks: OB continued to see in the hospital.  OB cleared her for discharge home.  Patient [facility].  Hypothyroid: Continue methimazole started by OB.  No clinical features of hyperthyroidism at this time. TSH: <0.010, free T3: 8.8 and free T4: 4.32.  Hypertension:  Treated in the hospital with labetalol 100 Mg twice daily, this was half her home dose.  Now that she has improved, BP starting to trend up, at discharge continued prior home  dose of labetalol 200 Mg twice daily.  Also OB had started her on aspirin 81 Mg daily to prevent preeclampsia in third trimester, continue same.  Body mass index is 40.72 kg/m./Morbid obesity      Consultants:   General surgery OB  Procedures:   None.    Discharge Instructions  Discharge Instructions    Call MD for:  difficulty breathing, headache or visual disturbances   Complete by: As directed    Call MD for:  extreme fatigue   Complete by: As directed    Call MD for:  persistant dizziness or light-headedness   Complete by: As directed    Call MD for:  persistant nausea and vomiting   Complete by: As directed    Call MD for:  severe uncontrolled pain   Complete by: As directed    Call MD for:  temperature >100.4   Complete by: As directed    Diet - low sodium heart healthy   Complete by: As directed    Increase activity slowly   Complete by: As directed        Medication List    STOP taking these medications   metoCLOPramide 10 MG tablet Commonly known as: REGLAN     TAKE these medications   aspirin 81 MG chewable tablet Chew 1  tablet (81 mg total) by mouth daily. Start taking on: June 16, 2020   docusate sodium 100 MG capsule Commonly known as: COLACE Take 1 capsule (100 mg total) by mouth 2 (two) times daily.   famotidine 20 MG tablet Commonly known as: PEPCID Take 1 tablet (20 mg total) by mouth 2 (two) times daily. What changed: Another medication with the same name was removed. Continue taking this medication, and follow the directions you see here.   labetalol 200 MG tablet Commonly known as: NORMODYNE Take 1 tablet by mouth twice a day. What changed:   how much to take  how to take this  when to take this  Another medication with the same name was removed. Continue taking this medication, and follow the directions you see here.   Magnesium Oxide 400 MG Caps Take 1 capsule (400 mg total) by mouth 2 (two) times daily.    methimazole 5 MG tablet Commonly known as: TAPAZOLE Take 1 tablet (5 mg total) by mouth 3 (three) times daily.   metroNIDAZOLE 500 MG tablet Commonly known as: FLAGYL Take 1 tablet (500 mg total) by mouth 2 (two) times daily.   ondansetron 4 MG disintegrating tablet Commonly known as: ZOFRAN-ODT Dissolve 1 tablet sublingually every 8 hours as needed What changed:   how much to take  when to take this  reasons to take this  Another medication with the same name was removed. Continue taking this medication, and follow the directions you see here.   polyethylene glycol 17 g packet Commonly known as: MIRALAX / GLYCOLAX Take 17 g by mouth daily. Start taking on: June 16, 2020   PRENATAL VITAMINS PO Take 1 tablet by mouth daily.   scopolamine 1 MG/3DAYS Commonly known as: TRANSDERM-SCOP Place 1 patch onto the skin every 3 days as directed Start taking on: June 16, 2020   ursodiol 300 MG capsule Commonly known as: ACTIGALL Take 1 capsule by mouth 3 times daily.      Allergies  Allergen Reactions  . Other     Cantaloupe: itching      Procedures/Studies: US OB Comp Less 14 Wks  Result Date: 06/10/2020 CLINICAL DATA:  Bleeding EXAM: OBSTETRIC <14 WK ULTRASOUND TECHNIQUE: Transabdominal ultrasound was performed for evaluation of the gestation as well as the maternal uterus and adnexal regions. COMPARISON:  None. FINDINGS: Intrauterine gestational sac: Single Yolk sac:  Not visualized Embryo:  Visualized Cardiac Activity: Visualized Heart Rate: 164 bpm CRL: 78 mm   13 w 6 d                  [place] EDC: 12/10/2020 Subchorionic hemorrhage:  None visualized. Maternal uterus/adnexae: Ovaries nonvisualized. Large exophytic mass at the uterine fundus measuring 13.4 x 9.3 x 14.9 cm. No free fluid IMPRESSION: 1. Single viable intrauterine pregnancy as above. 2. Large 14.9 cm exophytic fibroid off the uterine fundus. Otherwise no specific abnormality is seen. Electronically Signed   By: Donavan Foil M.D.   On: 06/10/2020 01:45   DG Abd Portable 1V  Result Date: 06/13/2020 CLINICAL DATA:  Constipation. Nausea and vomiting. Patient is [redacted] weeks pregnant. EXAM: PORTABLE ABDOMEN - 1 VIEW COMPARISON:  None. FINDINGS: Moderate colonic stool burden without evidence of enteric obstruction. Nondiagnostic evaluation for pneumoperitoneum secondary to supine positioning and exclusion of the lower thorax. No pneumatosis or portal venous gas. There is incomplete fusion involving the posterior elements of L5. No acute osseous abnormalities. IMPRESSION: Moderate colonic stool burden without evidence of enteric  obstruction. Electronically Signed   By: Sandi Mariscal M.D.   On: 06/13/2020 10:55   US Abdomen Limited RUQ (LIVER/GB)  Result Date: 06/10/2020 CLINICAL DATA:  Nausea and vomiting, right upper quadrant pain, [redacted] weeks pregnant, increased liver function tests EXAM: ULTRASOUND ABDOMEN LIMITED RIGHT UPPER QUADRANT COMPARISON:  None. FINDINGS: Gallbladder: Gallbladder is moderately distended, with gallbladder sludge layering dependently. No shadowing gallstones. There is borderline gallbladder wall thickening measuring 4 mm. No pericholecystic fluid. Negative sonographic Murphy sign. Common bile duct: Diameter: 3 mm Liver: No focal lesion identified. Within normal limits in parenchymal echogenicity. Portal vein is patent on color Doppler imaging with normal direction of blood flow towards the liver. Other: None. IMPRESSION: 1. Distended gallbladder, with internal echogenic sludge and borderline gallbladder wall thickening. No shadowing gallstones and negative sonographic Murphy sign. Findings are equivocal for acute cholecystitis. Electronically Signed   By: Randa Ngo M.D.   On: 06/10/2020 03:36      Subjective: Overall feels much better.  Having BMs.  Mild intermittent abdominal discomfort but overall improved.  Tolerating diet without nausea or vomiting.  Eager for discharge home.  Discharge  Exam:  Vitals:   06/14/20 1425 06/14/20 2031 06/15/20 0432 06/15/20 1331  BP: (!) 123/58 121/61 (!) 147/67 (!) 138/94  Pulse: 88 (!) 101 (!) 102 93  Resp: 17 16 16 17   Temp: 97.9 F (36.6 C) 98.9 F (37.2 C) 98.5 F (36.9 C) 98.4 F (36.9 C)  TempSrc: Oral Oral Oral Oral  SpO2: 95% 99% 100% 100%  Weight:      Height:        General exam: Pleasant young female, moderately built and morbidly obese, lying comfortably supine in bed.  Oral mucosa moist. Respiratory system: Clear to auscultation.  No increased work of breathing. Cardiovascular system: S1 & S2 heard, RRR. No JVD, murmurs, rubs, gallops or clicks.  Trace bilateral ankle edema. Gastrointestinal system: Abdomen is nondistended, soft and nontender.  No organomegaly or masses appreciated.  Normal bowel sounds heard. Central nervous system: Alert and oriented. No focal neurological deficits. Extremities: Symmetric 5 x 5 power. Skin: No rashes, lesions or ulcers Psychiatry: Judgement and insight appear normal. Mood & affect appropriate.     The results of significant diagnostics from this hospitalization (including imaging, microbiology, ancillary and laboratory) are listed below for reference.     Microbiology: Recent Results (from the past 240 hour(s))  Wet prep, genital     Status: Abnormal   Collection Time: 06/10/20  2:34 AM   Specimen: Genital  Result Value Ref Range Status   Yeast Wet Prep HPF POC NONE SEEN NONE SEEN Final   Trich, Wet Prep PRESENT (A) NONE SEEN Final   Clue Cells Wet Prep HPF POC PRESENT (A) NONE SEEN Final   WBC, Wet Prep HPF POC FEW (A) NONE SEEN Final   Sperm NONE SEEN  Final    Comment: Performed at [facility], [place]. [address], [place], [place] [address]  Culture, Urine     Status: Abnormal   Collection Time: 06/10/20  6:11 AM   Specimen: Urine, Random  Result Value Ref Range Status   Specimen Description URINE, RANDOM  Final   Special Requests   Final    NONE Performed at  [facility], [place] [address] [place], [place] [address]    Culture MULTIPLE SPECIES PRESENT, SUGGEST RECOLLECTION (A)  Final   Report Status 06/13/2020 FINAL  Final  Resp Panel by RT-PCR (Flu A&B, Covid)  Nasopharyngeal Swab     Status: None   Collection Time: 06/10/20  9:00 AM   Specimen: Nasopharyngeal Swab; Nasopharyngeal(NP) swabs in vial transport medium  Result Value Ref Range Status   SARS Coronavirus 2 by RT PCR NEGATIVE NEGATIVE Final    Comment: (NOTE) SARS-CoV-2 target nucleic acids are NOT DETECTED.  The SARS-CoV-2 RNA is generally detectable in upper respiratory specimens during the acute phase of infection. The lowest concentration of SARS-CoV-2 viral copies this assay can detect is 138 copies/mL. A negative result does not preclude SARS-Cov-2 infection and should not be used as the sole basis for treatment or other patient management decisions. A negative result may occur with  improper specimen collection/handling, submission of specimen other than nasopharyngeal swab, presence of viral mutation(s) within the areas targeted by this assay, and inadequate number of viral copies(<138 copies/mL). A negative result must be combined with clinical observations, patient history, and epidemiological information. The expected result is Negative.  Fact Sheet for Patients:  EntrepreneurPulse.com.au  Fact Sheet for Healthcare Providers:  IncredibleEmployment.be  This test is no t yet approved or cleared by the [place] FDA and  has been authorized for detection and/or diagnosis of SARS-CoV-2 by FDA under an Emergency Use Authorization (EUA). This EUA will remain  in effect (meaning this test can be used) for the duration of the COVID-19 declaration under Section 564(b)(1) of the Act, 21 U.S.C.section 360bbb-3(b)(1), unless the authorization is terminated  or revoked sooner.       Influenza A by PCR NEGATIVE NEGATIVE Final    Influenza B by PCR NEGATIVE NEGATIVE Final    Comment: (NOTE) The Xpert Xpress SARS-CoV-2/FLU/RSV plus assay is intended as an aid in the diagnosis of influenza from Nasopharyngeal swab specimens and should not be used as a sole basis for treatment. Nasal washings and aspirates are unacceptable for Xpert Xpress SARS-CoV-2/FLU/RSV testing.  Fact Sheet for Patients: EntrepreneurPulse.com.au  Fact Sheet for Healthcare Providers: IncredibleEmployment.be  This test is not yet approved or cleared by the [place] FDA and has been authorized for detection and/or diagnosis of SARS-CoV-2 by FDA under an Emergency Use Authorization (EUA). This EUA will remain in effect (meaning this test can be used) for the duration of the COVID-19 declaration under Section 564(b)(1) of the Act, 21 U.S.C. section 360bbb-3(b)(1), unless the authorization is terminated or revoked.  Performed at [facility], [place] [address], [place], [place] [address]      Labs: CBC: Recent Labs  Lab 06/11/20 0032 06/12/20 0048 06/13/20 0125 06/14/20 0116 06/15/20 0030  WBC 6.9 7.9 10.1 9.2 9.3  HGB 8.9* 7.9* 8.3* 7.4* 7.4*  HCT 26.6* 23.9* 25.6* 23.1* 23.1*  MCV 81.1 83.3 84.5 84.3 84.9  PLT 180 177 213 203 416    Basic Metabolic Panel: Recent Labs  Lab 06/12/20 0048 06/13/20 0125 06/14/20 0116 06/14/20 1702 06/15/20 0030 06/15/20 0704  NA 134* 136 138 136  --  137  K 3.0* 3.3* 3.2* 3.8  --  3.5  CL 94* 100 105 105  --  107  CO2 30 26 27 25   --  24  GLUCOSE 126* 115* 112* 101*  --  81  BUN 36* 25* 15 12  --  8  CREATININE 1.59* 1.37* 0.96 1.03*  --  0.90  CALCIUM 8.8* 8.9 8.6* 8.7*  --  8.4*  MG 1.5* 1.4* 1.3* 1.8 1.5* 1.3*    Liver Function Tests: Recent Labs  Lab 06/11/20 0032 06/12/20 0048 06/13/20 0125 06/14/20  0116 06/15/20 0704  AST 202* 146* 111* 84* 59*  ALT 210* 158* 140* 102* 75*  ALKPHOS 57 82 70 59 54  BILITOT 1.6* 1.0 0.9 0.7 0.6   PROT 5.4* 5.1* 5.6* 4.9* 4.7*  ALBUMIN 2.2* 2.0* 2.1* 1.8* 1.8*     Urinalysis    Component Value Date/Time   COLORURINE AMBER (A) 06/10/2020 0609   APPEARANCEUR CLOUDY (A) 06/10/2020 0609   LABSPEC 1.010 06/10/2020 0609   PHURINE 5.0 06/10/2020 0609   GLUCOSEU NEGATIVE 06/10/2020 0609   HGBUR SMALL (A) 06/10/2020 0609   HGBUR small 11/26/2009 [place] 06/10/2020 0609   KETONESUR 5 (A) 06/10/2020 0609   PROTEINUR 30 (A) 06/10/2020 0609   UROBILINOGEN 0.2 03/25/2020 1157   NITRITE NEGATIVE 06/10/2020 0609   LEUKOCYTESUR TRACE (A) 06/10/2020 0609   I discussed in detail with patient's mother via phone, updated care and answered all questions.  She was visiting patient in her room.   Time coordinating discharge: 35 minutes  SIGNED:  Vernell Leep, MD, [place], [facility]. [facility]  To contact the attending provider between 7A-7P or the covering provider during after hours 7P-7A, please log into the web site [email or web address] and access using universal [place] password for that web site. If you do not have the password, please call the hospital operator.

## 2020-06-15 NOTE — Progress Notes (Signed)
OB/GYN  S:  Pt without new complaints today. Feels good. Tolerating diet. Pt reports hopes to go home today  O: AF VSS Lungs clear Heart RRR Abd soft + BS  A/P Stable from a OB standpoint. Continue treatment for trichomoniasis for a total of 7 days. Information our office and [facility] provided for pt. Will sign off.  On behalf of [facility] [employer], I would like to thank Dr Algis Liming and his medical team for allow [place] to participate in Ms Sukhu's care.

## 2020-06-15 NOTE — Plan of Care (Signed)

## 2020-06-18 ENCOUNTER — Other Ambulatory Visit (HOSPITAL_COMMUNITY): Payer: Self-pay

## 2020-06-21 ENCOUNTER — Encounter: Payer: Self-pay | Admitting: *Deleted

## 2020-06-21 ENCOUNTER — Telehealth (INDEPENDENT_AMBULATORY_CARE_PROVIDER_SITE_OTHER): Payer: Medicaid Other | Admitting: *Deleted

## 2020-06-21 ENCOUNTER — Other Ambulatory Visit: Payer: Self-pay

## 2020-06-21 DIAGNOSIS — E876 Hypokalemia: Secondary | ICD-10-CM

## 2020-06-21 DIAGNOSIS — K851 Biliary acute pancreatitis without necrosis or infection: Secondary | ICD-10-CM

## 2020-06-21 DIAGNOSIS — A6000 Herpesviral infection of urogenital system, unspecified: Secondary | ICD-10-CM | POA: Insufficient documentation

## 2020-06-21 DIAGNOSIS — E871 Hypo-osmolality and hyponatremia: Secondary | ICD-10-CM

## 2020-06-21 DIAGNOSIS — O0990 Supervision of high risk pregnancy, unspecified, unspecified trimester: Secondary | ICD-10-CM

## 2020-06-21 DIAGNOSIS — R7989 Other specified abnormal findings of blood chemistry: Secondary | ICD-10-CM

## 2020-06-21 DIAGNOSIS — E0590 Thyrotoxicosis, unspecified without thyrotoxic crisis or storm: Secondary | ICD-10-CM

## 2020-06-21 DIAGNOSIS — Z3A00 Weeks of gestation of pregnancy not specified: Secondary | ICD-10-CM

## 2020-06-21 DIAGNOSIS — Z3490 Encounter for supervision of normal pregnancy, unspecified, unspecified trimester: Secondary | ICD-10-CM

## 2020-06-21 DIAGNOSIS — O10919 Unspecified pre-existing hypertension complicating pregnancy, unspecified trimester: Secondary | ICD-10-CM

## 2020-06-21 MED ORDER — GOJJI WEIGHT SCALE MISC: 1.0000 | Freq: Once | 0 refills | Status: AC

## 2020-06-21 MED ORDER — BLOOD PRESSURE KIT DEVI: 1.0000 | 0 refills | Status: DC | PRN

## 2020-06-21 NOTE — Progress Notes (Signed)
New OB Intake  I connected with  Franciso Bend on 06/21/20 at 1:10 by MyChart Video Visit and verified that I am speaking with the correct person using two identifiers. Nurse is located at [facility] and pt is located at her Grandmother's house .  I discussed the limitations, risks, security and privacy concerns of performing an evaluation and management service by telephone and the availability of in person appointments. I also discussed with the patient that there may be a patient responsible charge related to this service. The patient expressed understanding and agreed to proceed.  I explained I am completing New OB Intake today. We discussed her EDD of 12/06/20 that is based on LMP of 03/01/20 . States LMP ended 03/05/20. Marland Kitchen Pt is G1/P0. I reviewed her allergies, medications, Medical/Surgical/OB history, and appropriate screenings. I informed her of [facility] services. Based on history, this is a/an  pregnancy complicated by Chtn, Gallstone Pancreatitis, Hyperemesis, Elevated Liver enzymes  .   Patient Active Problem List   Diagnosis Date Noted   Supervision of high risk pregnancy, antepartum 06/21/2020   Preexisting hypertension complicating pregnancy, antepartum 06/21/2020   Genital herpes    Hyperthyroidism 06/11/2020   Hypokalemia 06/11/2020   Hyponatremia 06/11/2020   Abnormal LFTs 06/11/2020   AKI (acute kidney injury) ([place]) 06/11/2020   Acute gallstone pancreatitis 06/10/2020   Chlamydial infection 04/23/2020   HPV in female 07/28/2016   HYPERTENSION, BENIGN ESSENTIAL 11/26/2009   GENITAL HERPES 04/26/2009   MORBID OBESITY 04/26/2009    Concerns addressed today  Delivery Plans:  Plans to deliver at [facility].   MyChart/Babyscripts MyChart access verified. I explained pt will have some visits in office and some virtually. Babyscripts instructions given and order placed.   Blood Pressure Cuff  Blood pressure cuff ordered for patient to pick-up from [employer]. Explained after  first prenatal appt pt will check weekly and document in 71.  Weight scale: Patient  does not   have weight scale. Weight scale ordered for patient to pick up.   Anatomy US Explained first scheduled [place] will be around 19 weeks. Anatomy US scheduled for 07/19/20 at 0830. Pt notified to arrive at [place].  Labs Discussed Johnsie Cancel genetic screening with patient. Would like both Panorama and Horizon drawn at new OB visit. Routine prenatal labs needed.  Covid Vaccine Patient has covid vaccine.   Social Determinants of Health Food Insecurity: Patient denies food insecurity. WIC Referral: Patient is not interested in referral to [facility].  States she has [place].  Transportation: Patient denies transportation needs. Childcare: Discussed no children allowed at ultrasound appointments. Offered childcare services; patient declines childcare services at this time.  Patient did ask about getting release to go to work.  We discussed we have not seen her in the office and our doctors did not take her out of work. She states she felt so bad her boss encouraged her to take FMLA. States they won't let her return without doctors letter. I informed her I will need to review with one of our doctors and will send her a message via [place].  First visit review I reviewed new OB appt with pt. I explained she will have a pelvic exam, ob bloodwork with genetic screening, and PAP smear. Explained pt will be seen by DrEckstat at first visit; encounter routed to appropriate provider. Explained that patient will be seen by pregnancy navigator following visit with provider. [facility] information placed in AVS.   Dariel Pellecchia,RN 06/21/2020  1:10 PM

## 2020-06-21 NOTE — Patient Instructions (Signed)
  At our [facility], we work as an integrated team, providing care to address both physical and emotional health. Your medical provider may refer you to see our Behavioral Health Clinician (BHC) on the same day you see your medical provider, as availability permits; often scheduled virtually at your convenience.  Our BHC is available to all patients, visits generally last between 20-30 minutes, but can be longer or shorter, depending on patient need. The BHC offers help with stress management, coping with symptoms of depression and anxiety, major life changes , sleep issues, changing risky behavior, grief and loss, life stress, working on personal life goals, and  behavioral health issues, as these all affect your overall health and wellness.  The BHC is NOT available for the following: FMLA paperwork, court-ordered evaluations, specialty assessments (custody or disability), letters to employers, or obtaining certification for an emotional support animal. The BHC does not provide long-term therapy. You have the right to refuse integrated behavioral health services, or to reschedule to see the BHC at a later date.  Confidentiality exception: If it is suspected that a child or disabled adult is being abused or neglected, we are required by law to report that to either Child Protective Services or Adult Protective Services.  If you have a diagnosis of Bipolar affective disorder, Schizophrenia, or recurrent Major depressive disorder, we will recommend that you establish care with a psychiatrist, as these are lifelong, chronic conditions, and we want your overall emotional health and medications to be more closely monitored. If you anticipate needing extended maternity leave due to mental health issues postpartum, it it recommended you inform your medical provider, so we can put in a referral to a psychiatrist as soon as possible. The BHC is unable to recommend an extended maternity leave for mental  health issues. Your medical provider or BHC may refer you to a therapist for ongoing, traditional therapy, or to a psychiatrist, for medication management, if it would benefit your overall health. Depending on your insurance, you may have a copay or be charged a deductible, depending on your insurance, to see the BHC. If you are uninsured, it is recommended that you apply for financial assistance. (Forms may be requested at the front desk for in-person visits, via MyChart, or request a form during a virtual visit).  If you see the BHC more than 6 times, you will have to complete a comprehensive clinical assessment interview with the BHC to resume integrated services.  For virtual visits with the BHC, you must be physically in the state of [place] at the time of the visit. For example, if you live in [place], you will have to do an in-person visit with the BHC, and your out-of-state insurance may not cover behavioral health services in [place]. If you are going out of the state or country for any reason, the BHC may see you virtually when you return to [place], but not while you are physically outside of [place].    

## 2020-06-21 NOTE — Progress Notes (Signed)
Addendum: After virtual visit completed, I spoke with Dr. Elgie Congo and  he recommends she must be seen in office for her New ob Visit and that provider can determine if she is medically ready to go back to work.  I sent Kalin a MyChart message to notify her. Jarold Macomber,RN

## 2020-06-27 ENCOUNTER — Encounter: Payer: Self-pay | Admitting: Family Medicine

## 2020-06-27 ENCOUNTER — Ambulatory Visit (INDEPENDENT_AMBULATORY_CARE_PROVIDER_SITE_OTHER): Payer: Medicaid Other | Admitting: Family Medicine

## 2020-06-27 ENCOUNTER — Other Ambulatory Visit: Payer: Self-pay

## 2020-06-27 VITALS — BP 122/84 | HR 116 | Wt 278.9 lb

## 2020-06-27 DIAGNOSIS — E0590 Thyrotoxicosis, unspecified without thyrotoxic crisis or storm: Secondary | ICD-10-CM

## 2020-06-27 DIAGNOSIS — A749 Chlamydial infection, unspecified: Secondary | ICD-10-CM

## 2020-06-27 DIAGNOSIS — K851 Biliary acute pancreatitis without necrosis or infection: Secondary | ICD-10-CM | POA: Diagnosis not present

## 2020-06-27 DIAGNOSIS — O0990 Supervision of high risk pregnancy, unspecified, unspecified trimester: Secondary | ICD-10-CM | POA: Diagnosis not present

## 2020-06-27 DIAGNOSIS — D259 Leiomyoma of uterus, unspecified: Secondary | ICD-10-CM

## 2020-06-27 DIAGNOSIS — O99012 Anemia complicating pregnancy, second trimester: Secondary | ICD-10-CM

## 2020-06-27 DIAGNOSIS — O10919 Unspecified pre-existing hypertension complicating pregnancy, unspecified trimester: Secondary | ICD-10-CM

## 2020-06-27 DIAGNOSIS — A6000 Herpesviral infection of urogenital system, unspecified: Secondary | ICD-10-CM

## 2020-06-27 NOTE — Patient Instructions (Addendum)
Contraception Choices Contraception refers to things you do or use to prevent pregnancy. It is also called birth control. There are several methods of birth control. Talk to yourdoctor about the best method for you. Hormonal birth control This kind of birth control uses hormones. Here are some types of hormonal birth control: A tube that is put under the skin of your arm (implant). The tube can stay in for up to 3 years. Shots you get every 3 months. Pills you take every day. A patch you change 1 time each week for 3 weeks. After that, the patch is taken off for 1 week. A ring you put in the vagina. The ring is left in for 3 weeks. Then it is taken out of the vagina for 1 week. Then a new ring is put in. Pills you take after unprotected sex. These are called emergency birth control pills. Barrier birth control Here are some types of barrier birth control: A thin covering that is put on the penis before sex (female condom). The covering is thrown away after sex. A soft, loose covering that is put in the vagina before sex (female condom). The covering is thrown away after sex. A rubber bowl that sits over the cervix (diaphragm). The bowl must be made for you. The bowl is put into the vagina before sex. The bowl is left in for 6-8 hours after sex. It is taken out within 24 hours. A small, soft cup that fits over the cervix (cervical cap). The cup must be made for you. The cup should be left in for 6-8 hours after sex. It is taken out within 48 hours. A sponge that is put into the vagina before sex. It must be left in for at least 6 hours after sex. It must be taken out within 30 hours and thrown away. A chemical that kills or stops sperm from getting into the womb (uterus). This chemical is called a spermicide. It may be a pill, cream, jelly, or foam to put in the vagina. The chemical should be used at least 10-15 minutes before sex. IUD birth control IUD means "intrauterine device." It is put inside  the womb. There are two kinds: Hormone IUD. This kind can stay in the womb for 3-5 years. Copper IUD. This kind can stay in the womb for 10 years. Permanent birth control Here are some types of permanent birth control: Surgery to block the fallopian tubes. Having an insert put into each fallopian tube. This method takes 3 months to work. Other forms of birth control must be used for 3 months. Surgery to tie off the tubes that carry sperm in men (vasectomy). This method takes 3 months to work. Other forms of birth control must be used for 3 months. Natural planning birth control Here are some types of natural planning birth control: Not having sex on the days the woman could get pregnant. Using a calendar: To keep track of the length of each menstrual cycle. To find out what days pregnancy can happen. To plan to not have sex on days when pregnancy can happen. Watching for signs of ovulation and not having sex during this time. One way the woman can check for ovulation is to check her temperature. Waiting to have sex until after ovulation. Where to find more information Centers for Disease Control and Prevention: http://www.wolf.info/ Summary Contraception, also called birth control, refers to things you do or use to prevent pregnancy. Hormonal methods of birth control include implants, injections,  pills, patches, vaginal rings, and emergency birth control pills. Barrier methods of birth control can include female condoms, female condoms, diaphragms, cervical caps, sponges, and spermicides. There are two types of IUD (intrauterine device) birth control. An IUD can be put in a woman's womb to prevent pregnancy for several years. Permanent birth control can be done through a procedure for males, females, or both. Natural planning means not having sex when the woman could get pregnant. This information is not intended to replace advice given to you by your health care provider. Make sure you discuss any  questions you have with your healthcare provider. AREA PEDIATRIC/FAMILY PRACTICE PHYSICIANS  Central/Southeast [place] [phone]) [facility] Erin Hearing, MD; Gwendlyn Deutscher, MD; Walker Kehr, MD; Andria Frames, MD; McDiarmid, MD; Dutch Quint, MD; Nori Riis, MD; Mingo Amber, [place]., [place], [place] [address] [phone] Mon-Fri 8:30-12:30, 1:30-5:00 Providers come to see babies at [place] [facility] [place] providers who accept newborns: Dorthy Cooler, MD; Orland Mustard, MD; Stephanie Acre, MD [address], [place], [place] [address] [phone] Mon-Fri 8:00-5:30 Babies seen by providers at [facility] Does NOT accept Medicaid Please call early in hospitalization for appointment (limited availability)  [place], MD [address], [place], [place] [address] [phone] Mon, Tue, Thur, Fri 8:30-5:00, Wed 10:00-7:00 (closed 1-2pm) Babies seen by [facility] providers Accepting Medicaid [place], [place]. [place], [place], [place] [address] [phone] Mon-Fri 8:30-5:00, Sat 8:30-12:00 Provider comes to see babies at [facility] Accepting Medicaid Must have been referred from current patients or contacted office prior to delivery [place] for Child and [place] ([place] for [place]) Owens Shark, MD; Tamera Punt, MD; Doneen Poisson, MD; Fatima Sanger, MD; Wynetta Emery, MD; Jess Barters, MD; Tami Ribas, MD; Herbert Moors, MD; Derrell Lolling, MD; Dorothyann Peng, MD; Lucious Groves, NP; Baldo Ash, NP [address], [place], [place] [address] [phone] Mon, [place] Poot, Thur, Fri 8:30-5:30, Wed 9:30-5:30, Sat 8:30-12:30 Babies seen by [facility] providers Accepting Medicaid Only accepting infants of first-time parents or siblings of current patients Hospital discharge coordinator will make follow-up appointment Baltazar Najjar [address] [place], [place]   [address] [phone]   Fax - [facility] [place]. [address], [address], [place], [place]  [address] Phone - [phone]   Fax - [place] [address], [place], [place], [place]  [address] [phone]  East/Northeast [place] [phone]) [facility] Redmond Baseman, MD; Jacklynn Ganong, MD; Torrie Mayers, MD; MD; Rosana Hoes, MD; Servando Salina, MD; Rose Fillers, MD; Rex Kras, MD; Corinna Capra, MD; Volney American, MD; Trilby Drummer, MD; Janann Colonel, MD; Jimmye Norman, [place] [address] [place], [place], [place] [address] [phone] Mon-Fri 8:30-5:00 (extended evenings Mon-Thur as needed), Sat-Sun 10:00-1:00 Providers come to see babies at [facility] Accepting Medicaid for families of first-time babies and families with all children in the household age 61 and under. Must register with office prior to making appointment (M-F only). Opal, NP; Tomi Bamberger, MD; Redmond School, MD; [place], [place] [address], [place], [place] [address] [phone] Mon-Fri 8:00-5:00 Babies seen by providers at [facility] Does NOT accept Medicaid/Commercial Insurance Only [facility] [place] ([facility])  Sabino Gasser, MD; Drema Dallas, MD; Montine Circle, MD; Vilma Prader, MD; Vanita Panda, MD; Alfonso Ramus, MD; Ruthann Cancer, MD; Roxanne Mins, MD; Rosalva Ferron, MD; Polly Cobia, MD [address] [place],  [address] [phone] Mon-Fri 8:30-5:30, Sat (Oct.-Mar.) 9:00-1:00 Babies seen by providers at [place] [phone]) [facility] Keels, MD; Suzan Slick, [place]  [address], [place], [place] [address] [phone] Mon-Fri 8:30-5:00, Sat 8:30-12:00 Providers come to see babies at [facility] Does NOT accept Medicaid [place] at Arlina [place], [place]; Mannie Stabile, MD; [place], [place]; Nancy Fetter, MD; Moreen Fowler, [place], [place], [place] [address] [phone] Mon-Fri 8:00-5:00 Babies seen by providers at [facility] Does NOT accept Medicaid Only accepting babies of parents who are patients Please call early in hospitalization for appointment (limited availability) [facility]ians Carlis Abbott, MD; Sharlene Motts, MD; Rod Can, MD; Warner Mccreedy, NP; Sabra Heck, MD; Ermalinda Memos, MD; Sharlett Iles, NP; Aurther Loft, MD; Jerrye Beavers, MD; Marcello Moores, MD; Berline Lopes, MD; Charolette Forward, MD [place] [place]. [place], [place], [place] [address] [phone] Mon-Fri 8:00-5:00, Sat 9:00-12:00 Providers come to see babies at [facility] Does NOT accept [facility] [phone]) [place] [facility] providers accepting new patients: Dayna Ramus, NP; [place] Porter, [place], [place], [place] [address] [phone] Mon-Fri 8:00-5:00 Babies seen by providers at [facility] Does NOT accept Medicaid Only accepting babies of parents who are patients Please call early in hospitalization for appointment (limited availability) [facility] Abner Greenspan, MD; Sheran Lawless, MD [place]., [place], [place] [address] [phone] (press 1 to schedule appointment) Mon-Fri 8:00-5:00 Providers come to see babies at [facility] Does NOT accept [facility], MD [address] [place], [place] [address] [phone] Mon-Fri 8:30-5:00 (lunch 12:30-1:00), extended hours by appointment only Wed 5:00-6:30 Babies seen by [facility] providers Accepting Medicaid [place] at Nadeen Landau, MD; [place], [place]; Ethlyn Gallery, [place], [place], [place] [address] [phone] Mon-Fri 8:00-5:00 Babies seen by [facility] providers Does NOT accept Medicaid [place] at [place], MD; Yong Channel, MD; [place], [place] [place], [place] [address] [phone] Mon-Fri 8:00-5:00 Babies seen by [facility] providers Does NOT accept [facility], [place]; [place], [place]; [place], [place]; Albertina Parr, MD; Frederic Jericho, MD; Ronney Lion,  MD; Carlos Levering, NP; Jerelene Redden, NP; Tomasita Crumble, NP; Ronelle Nigh, NP; Corinna Lines, MD; Karsten Ro, MD [address] [place], [place] [address] [phone] Mon-Fri 8:30-5:00, Sat 10:00-1:00 Providers come to see babies at [facility] Does NOT accept Medicaid Free prenatal information session Tuesdays at 4:45pm [place], MD; [place], [place]; [place], [place]; [place], [place]., [place] [address] [phone] Mon-Fri 7:30-5:30 Babies seen by [place] Doctor [address] [place], [place], [place]  [address] [phone]   Fax - [phone]  [place] [phone] & [phone]) [facility] [place] [address] [address] [place], [place] [address] [phone] Mon-Thur 8:00-6:00 Providers come to see babies at [facility] Accepting Medicaid [place], NP; Melford Aase, MD; [place], [place]; [place], [address] [place], [place] [address] [phone] Mon-Thur 7:30-7:30, Fri 7:30-4:30 Babies seen by [facility] providers Accepting [facility] Carolynn Sayers, MD; Cristino Martes, NP; Gertie Baron, MD [place], [place], [place] [address] [phone] Mon-Fri 8:30-5:00, Sat 8:30-12:00 Providers come to see babies at [facility] Accepting Medicaid Must have "Meet & Greet" appointment at office prior to delivery [place] ([place]) Faythe Dingwall, MD; Juleen China, MD; Clydene Laming, [place], [place],  [address] [phone] Mon-Wed 8:00-6:00, Thur-Fri 8:00-5:00, Sat 9:00-12:00 Providers come to see babies at [facility] Does NOT accept Medicaid Only accepting siblings of current patients [facility] [place], [place], [place],   [address] [phone]   Fax - [phone] [facility] Medicine at  [place] [address]  [place], [place]  [address] [phone]   Fax - [place] [phone] & [phone]) [place] at [facility], [place]; [place], [place] [address] [place], [place] [address] [phone] Mon-Fri 7:00-5:00 Babies seen by [facility] providers Does NOT accept Medicaid Del Aire, MD; [place], [place]; [place], [place] [address], [place], [place] [address] [phone] Mon-Fri 8:00-5:00 Babies seen by [facility] providers Accepting [facility] Komelik, MD; [place], [place]; [place], NP; [place], [place] [address] [place], [place], [place] [address] [phone] Mon-Fri 8:00-5:00 Babies seen by providers at [place] [phone]) [facility] at [place], [place] [address] Madelaine Bhat [place], [place] [address] [phone] Mon-Fri 8:00-5:00 Babies seen by [facility] providers Does NOT accept Medicaid Limited availability, please call early in hospitalization to schedule follow-up [facility] Kennedy Bucker, [place]; Maisie Fus, MD; Charlesetta Garibaldi, MD; [place], [place]; Jeannine Kitten, MD; [place], [address], [place], [place] [address] [phone] Mon-Fri 8:30-5:00, Sat 9:00-12:00 Babies seen by providers at [facility] Accepting Medicaid Please register online then schedule online or call office [email or web address] [place] ([place] at [place]) Yong Channel, NP; Dwyane Dee, MD; Leonidas Romberg, [place] [address] [place], [place], [place] [address] [phone] Mon-Fri 8:00-5:00 Babies seen by providers at [place] ([facility] [place]) [place], MD; Rayvon Char, NP; Melina Modena, MD [address] [place], [place], [place] [address] [phone] Mon-Fri  8:00-5:30, Sat&Sun by appointment (phones open at 8:30) Babies seen by [facility] providers Accepting Medicaid Must be a first-time baby or sibling of current patient [place]  [address], [place], [place]  [address] [phone]   Fax - [phone]  [address] [phone] & [phone]) [place], [place]; [place], [place]; [place], MD; [place], [place]; Harrell Lark, MD [address], [place], [place] [address] [phone] Mon-Thur 8:00-7:00, Fri 8:00-5:00, Sat 8:00-12:00, Sun 9:00-12:00 Babies seen by [facility] providers Accepting Medicaid [facility] [place] at Mikey College, MD; Ruthann Cancer, MD; [facility], MD [address] [place], [place], [place] [address] [phone] Mon-Thur 8:00-5:00 Babies seen by providers at [facility] Accepting Medicaid [facility] [place] at Magdalene Molly, MD; Coe[place]Go[place], MD; Amedeo Plenty, MD; Bobby Rumpf, MD; List, MD; Lavonia Drafts, MD; Ruthann Cancer, MD; Selinda Eon, MD; Audie Box, MD; Jim Like, MD; Christie Nottingham, MD; Hubbard Hartshorn, MD; Modena Nunnery, MD [address] Barbara Cower [place], [place] [address] [phone] Mon-Fri 8:00-5:30, Sat (Oct.-Mar.) 9:00-1:00 Babies seen by providers at [facility] Accepting Medicaid Must fill out new patient packet, available online at [email or web address] ([facility]) Fredderick Severance, NP; Kenton Kingfisher, NP; Claiborne Billings, NP; Rolla Plate, MD; [place], [place]; Carola Rhine, MD; [place], MD; Delia Chimes, NP [address], [place], [place] [address] [phone] Mon-Thur 8:00-5:30, Fri 8:00-5:00 Babies seen by providers at [place] [phone]) [place], [place]; [place], MD; [place], MD; [place], [place] [facility] [address] [place], [place] [address] [phone] Mon-Fri 8:00-5:00 Babies seen by providers at [place] [phone]) [place] at [facility], [place]; Olen Pel, MD; [place], [place], [place],  [address] [phone] Mon-Fri 8:00-5:00 Babies seen by providers at [facility] Does NOT accept Medicaid Limited appointment availability, please call early in hospitalization  [place] at [facility], [place]; Shannon, MD [address], [place], [place] [address] [phone] Mon-Fri 8:00-5:00 Babies seen by [facility] providers Does NOT accept Medicaid [place], MD; Guy Sandifer, MD; [place], [place]; [place], MD [place], [place], [place] [address] [phone] Mon-Fri 8:00-5:00 After hours clinic [facility] [place], [place] [address]) [phone] Mon-Fri 5:00-8:00, Sat 12:00-6:00, Sun 10:00-4:00 Babies seen by [facility] providers Accepting Medicaid [place] at [facility] [address], [place], [place]  [address] [phone]   Fax - [phone]  [place] [phone]) [place] at [facility], [address] [place], [place], [place] [address] [phone] Mon-Fri 8:00-5:00 Babies seen by [facility] providers Does NOT accept Medicaid [place] ([place] at [place]) Daron Offer, [place] [address], [place], [place] [address] [phone] Mon-Thur 8:00-7:00, Fri 8:00-5:00, Sat 8:00-12:00 Babies seen by providers at [facility] Accepting Medicaid - but does not have vaccinations in office (must be received elsewhere) Limited availability, please call early in hospitalization  [place] [phone]) [place], [place], [place] [address] [phone]  Fax [phone]  Document Revised: 06/06/2019 Document Reviewed: 06/06/2019 Elsevier Patient Education  2022 [place].

## 2020-06-27 NOTE — Progress Notes (Signed)
Subjective:   Stacy Fowler is a 30 y.o. G1P0 at 56w6dby 13wk [place] seen today for her first obstetrical visit.  Her obstetrical history is significant for obesity and cHTN, hyperthyroidism, gallstone disease recently complicated by gallstone pancreatitis . Patient does intend to breast feed. Pregnancy history fully reviewed.  Patient reports no complaints.  HISTORY: OB History  Gravida Para Term Preterm AB Living  1 0 0 0 0 0  SAB IAB Ectopic Multiple Live Births  0 0 0 0 0    # Outcome Date GA Lbr Len/2nd Weight Sex Delivery Anes PTL Lv  1 Current            Last pap smear was maybe 3 years ago, reportedly normal at [facility]  Past Medical History:  Diagnosis Date   Chronic hypertension    Gallstone pancreatitis    Genital herpes    Hyperemesis complicating pregnancy, antepartum    Infection    UTI   Obesity    Skin infection    Past Surgical History:  Procedure Laterality Date   BREAST REDUCTION SURGERY Bilateral 2009   Family History  Problem Relation Age of Onset   Healthy Mother    Diabetes Other    Hypertension Other    Social History   Tobacco Use   Smoking status: Never   Smokeless tobacco: Never  Vaping Use   Vaping Use: Never used  Substance Use Topics   Alcohol use: Not Currently    Comment: occasionally   Drug use: No   Allergies  Allergen Reactions   Other     Cantaloupe: itching   Current Outpatient Medications on File Prior to Visit  Medication Sig Dispense Refill   aspirin 81 MG chewable tablet Chew 1 tablet (81 mg total) by mouth daily. 30 tablet 0   docusate sodium (COLACE) 100 MG capsule Take 1 capsule (100 mg total) by mouth 2 (two) times daily. 60 capsule 0   doxylamine, Sleep, (UNISOM) 25 MG tablet Take 25 mg by mouth at bedtime as needed.     famotidine (PEPCID) 20 MG tablet Take 1 tablet (20 mg total) by mouth 2 (two) times daily. 60 tablet 0   labetalol (NORMODYNE) 200 MG tablet Take 1 tablet by mouth twice a day.  (Patient taking differently: Take 200 mg by mouth 2 (two) times daily.) 60 tablet 1   Magnesium Oxide 400 MG CAPS Take 1 capsule (400 mg total) by mouth 2 (two) times daily. 15 capsule 0   ondansetron (ZOFRAN-ODT) 4 MG disintegrating tablet Dissolve 1 tablet sublingually every 8 hours as needed (Patient taking differently: Take 4 mg by mouth every 8 (eight) hours as needed for nausea.) 24 tablet 1   Prenatal Vit-Fe Fumarate-FA (PRENATAL VITAMINS PO) Take 1 tablet by mouth daily.     pyridOXINE (VITAMIN B-6) 100 MG tablet Take 100 mg by mouth daily.     scopolamine (TRANSDERM-SCOP) 1 MG/3DAYS Place 1 patch onto the skin every 3 days as directed 4 patch 0   ursodiol (ACTIGALL) 300 MG capsule Take 1 capsule by mouth 3 times daily. 90 capsule 0   Blood Pressure Monitoring (BLOOD PRESSURE KIT) DEVI 1 Device by Does not apply route as needed. ICD10 O09.90 may need larger cuff (Patient not taking: Reported on 06/27/2020) 1 each 0   methimazole (TAPAZOLE) 5 MG tablet Take 1 tablet (5 mg total) by mouth 3 (three) times daily. (Patient not taking: Reported on 06/27/2020) 90 tablet 0  metroNIDAZOLE (FLAGYL) 500 MG tablet Take 1 tablet (500 mg total) by mouth 2 (two) times daily. 7 tablet 0   polyethylene glycol (MIRALAX / GLYCOLAX) 17 g packet Take 17 g by mouth daily. (Patient not taking: Reported on 06/27/2020) 30 each 0   [DISCONTINUED] fluticasone (FLONASE) 50 MCG/ACT nasal spray Place 2 sprays into both nostrils daily. 1 g 0   [DISCONTINUED] ipratropium (ATROVENT) 0.06 % nasal spray Place 2 sprays into both nostrils 4 (four) times daily. 15 mL 0   [DISCONTINUED] sodium chloride (OCEAN) 0.65 % SOLN nasal spray Place 1 spray into both nostrils 3 (three) times daily as needed for congestion.     No current facility-administered medications on file prior to visit.     Exam   Vitals:   06/27/20 1529  BP: 122/84  Pulse: (!) 116  Weight: 278 lb 14.4 oz (126.5 kg)   Fetal Heart Rate (bpm): 164  Uterus:      Pelvic Exam: Perineum: no hemorrhoids, normal perineum   Vulva: normal external genitalia, no lesions   Vagina:  normal mucosa, normal discharge   Cervix: Unable to visualize or palpate on exam, large fibroid obstructing posterior vagina.   System: General: well-developed, well-nourished female in no acute distress   Skin: normal coloration and turgor, no rashes   Neurologic: oriented, normal, negative, normal mood   Extremities: normal strength, tone, and muscle mass, ROM of all joints is normal   HEENT PERRLA, extraocular movement intact and sclera clear, anicteric   Neck supple and no masses   Respiratory:  no respiratory distress     Assessment:   Pregnancy: G1P0 Patient Active Problem List   Diagnosis Date Noted   Supervision of high risk pregnancy, antepartum 06/21/2020   Preexisting hypertension complicating pregnancy, antepartum 06/21/2020   Genital herpes    Hyperthyroidism 06/11/2020   Hypokalemia 06/11/2020   Hyponatremia 06/11/2020   Abnormal LFTs 06/11/2020   AKI (acute kidney injury) ([place]) 06/11/2020   Acute gallstone pancreatitis 06/10/2020   Chlamydial infection 04/23/2020   HPV in female 07/28/2016   HYPERTENSION, BENIGN ESSENTIAL 11/26/2009   GENITAL HERPES 04/26/2009   MORBID OBESITY 04/26/2009     Plan:  1. Supervision of high risk pregnancy, antepartum BP and FHR normal Initial labs drawn. Continue prenatal vitamins. Genetic Screening discussed, NIPS: ordered. Ultrasound discussed; fetal anatomic survey: ordered. Problem list reviewed and updated. The nature of [facility] with multiple MDs and other Advanced Practice Providers was explained to patient; also emphasized that residents, students are part of our team.  2. Preexisting hypertension complicating pregnancy, antepartum Stable, BP normal on labetalol 262m BID  3. Hyperthyroidism Diagnosed in hospital, reports she is taking methimazole 547mTID Will  refer her to endocrinology for further management  4. Acute gallstone pancreatitis Admitted 06/09/20-06/15/20 with elevated LFTs, AKI, elevated lipase, hypokalemia. Surgery consulted and recommended ursodiol, conservative treatment and she improved. Per discharge summary if ongoing/worsening symptoms they would recommend lap chole in 2nd trimester, otherwise delay until after pregnancy. Today reports no symptoms since discharge, compliant with diet and ursodiol  5. Genital herpes simplex, unspecified site Reports no outbreaks for over a year  6. Trichomonal infection TOC today  7. Fibroid uterus Attempted to collect pap however unable to visualize cervix with speculum, attempted to clarify position with bimanual exam but there was obstructing mass in the posterior vagina, suspect a fibroid though given large stool burden noted on hospital discharge summary this is another possibility. Follow up  on anatomy scan   Routine obstetric precautions reviewed. Return in about 4 weeks (around 07/25/2020) for [facility], ob visit.

## 2020-06-28 ENCOUNTER — Telehealth: Payer: Self-pay | Admitting: Lactation Services

## 2020-06-28 ENCOUNTER — Other Ambulatory Visit (HOSPITAL_COMMUNITY): Payer: Self-pay

## 2020-06-28 LAB — CBC/D/PLT+RPR+RH+ABO+RUBIGG...
Antibody Screen: NEGATIVE
Basophils Absolute: 0 10*3/uL (ref 0.0–0.2)
Basos: 0 %
EOS (ABSOLUTE): 0.2 10*3/uL (ref 0.0–0.4)
Eos: 2 %
HCV Ab: <0.1 s/co ratio (ref 0.0–0.9)
HIV Screen 4th Generation wRfx: NONREACTIVE
Hematocrit: 28.4 % — ABNORMAL LOW (ref 34.0–46.6)
Hemoglobin: 9.4 g/dL — ABNORMAL LOW (ref 11.1–15.9)
Hepatitis B Surface Ag: NEGATIVE
Immature Grans (Abs): 0 10*3/uL (ref 0.0–0.1)
Immature Granulocytes: 0 %
Lymphocytes Absolute: 2.4 10*3/uL (ref 0.7–3.1)
Lymphs: 22 %
MCH: 27.2 pg (ref 26.6–33.0)
MCHC: 33.1 g/dL (ref 31.5–35.7)
MCV: 82 fL (ref 79–97)
Monocytes Absolute: 0.4 10*3/uL (ref 0.1–0.9)
Monocytes: 4 %
Neutrophils Absolute: 7.8 10*3/uL — ABNORMAL HIGH (ref 1.4–7.0)
Neutrophils: 72 %
Platelets: 372 10*3/uL (ref 150–450)
RBC: 3.45 x10E6/uL — ABNORMAL LOW (ref 3.77–5.28)
RDW: 14.4 % (ref 11.7–15.4)
RPR Ser Ql: NONREACTIVE
Rh Factor: POSITIVE
Rubella Antibodies, IGG: 3.14 index (ref >0.99)
WBC: 10.9 10*3/uL — ABNORMAL HIGH (ref 3.4–10.8)

## 2020-06-28 LAB — COMPREHENSIVE METABOLIC PANEL
ALT: 15 IU/L (ref 0–32)
AST: 20 IU/L (ref 0–40)
Albumin/Globulin Ratio: 1.2 (ref 1.2–2.2)
Albumin: 3.3 g/dL — ABNORMAL LOW (ref 3.8–4.8)
Alkaline Phosphatase: 78 IU/L (ref 44–121)
BUN/Creatinine Ratio: 9 (ref 9–23)
BUN: 6 mg/dL (ref 6–20)
Bilirubin Total: 0.3 mg/dL (ref 0.0–1.2)
CO2: 16 mmol/L — ABNORMAL LOW (ref 20–29)
Calcium: 9.2 mg/dL (ref 8.7–10.2)
Chloride: 105 mmol/L (ref 96–106)
Creatinine, Ser: 0.65 mg/dL (ref 0.57–1.00)
Globulin, Total: 2.8 g/dL (ref 1.5–4.5)
Glucose: 75 mg/dL (ref 65–99)
Potassium: 3.6 mmol/L (ref 3.5–5.2)
Sodium: 139 mmol/L (ref 134–144)
Total Protein: 6.1 g/dL (ref 6.0–8.5)
eGFR: 121 mL/min/{1.73_m2} (ref >59)

## 2020-06-28 LAB — TSH: TSH: 1.42 u[IU]/mL (ref 0.450–4.500)

## 2020-06-28 LAB — HCV INTERPRETATION

## 2020-06-28 LAB — HEMOGLOBIN A1C
Est. average glucose Bld gHb Est-mCnc: 120 mg/dL
Hgb A1c MFr Bld: 5.8 % — ABNORMAL HIGH (ref 4.8–5.6)

## 2020-06-28 MED ORDER — FERROUS SULFATE 325 (65 FE) MG PO TBEC
325.0000 mg | DELAYED_RELEASE_TABLET | ORAL | 2 refills | Status: DC
  Filled 2020-06-28: qty 30, 60d supply, fill #0

## 2020-06-28 NOTE — Telephone Encounter (Signed)
Called patient to review lab results. She did not answer when calling [phone]. LM to call the office at her convenience for lab results. Asked patient to check My Chart message also.   Called patient on [phone] and number not in service.   My Chart Message sent.

## 2020-06-28 NOTE — Addendum Note (Signed)
Addended by: Clayton Lefort on: 06/28/2020 08:24 AM   Modules accepted: Orders

## 2020-06-28 NOTE — Telephone Encounter (Signed)
-----   Message from Clarnce Flock, MD sent at 06/28/2020  8:23 AM EDT ----- Initial OB labs: CMP abnl's have normalized Mild anemia, hgb 9.4, will send iron TSH has normalized on methimazole A1c elevated, clinical staff please coordinate 2hr GTT testing

## 2020-07-01 LAB — URINE CULTURE, OB REFLEX

## 2020-07-01 LAB — CULTURE, OB URINE

## 2020-07-02 ENCOUNTER — Other Ambulatory Visit (HOSPITAL_COMMUNITY): Payer: Self-pay

## 2020-07-03 ENCOUNTER — Other Ambulatory Visit (HOSPITAL_COMMUNITY): Payer: Self-pay

## 2020-07-06 ENCOUNTER — Ambulatory Visit: Payer: Medicaid Other

## 2020-07-09 ENCOUNTER — Other Ambulatory Visit: Payer: Self-pay | Admitting: Family Medicine

## 2020-07-10 ENCOUNTER — Telehealth: Payer: Self-pay | Admitting: *Deleted

## 2020-07-10 NOTE — Telephone Encounter (Signed)
Call to patient regarding disability forms for completion. Left message to call back to Gay Filler at [phone] regarding "forms." No patient identifiers left on voice mail.

## 2020-07-13 ENCOUNTER — Ambulatory Visit: Payer: Medicaid Other | Admitting: Internal Medicine

## 2020-07-13 NOTE — Progress Notes (Deleted)
Name: Stacy Fowler  MRN/ DOB: [id number], Oct 23, 1990    Age/ Sex: 30 y.o., female    PCP: Patient, No Pcp Per (Inactive)   Reason for Endocrinology Evaluation: Hyperthyroidism during pregnancy      Date of Initial Endocrinology Evaluation: 07/13/2020     HPI: Stacy Fowler is a 30 y.o. female with a past medical history of Pre-diabetes, . The patient presented for initial endocrinology clinic visit on 07/13/2020 for consultative assistance with her hyperthyroidism during pregnancy .   She was noted with hyperthyroidism TSH < 0.01 uIU/Ml elevated T4 at 4.32 ng/dL and elevated FT3 at 8.8 pg/mL in May, 2022 during first trimester    Today she is at 19.1 weeks of gestation      HISTORY:  Past Medical History:  Past Medical History:  Diagnosis Date   Chronic hypertension    Gallstone pancreatitis    Genital herpes    Hyperemesis complicating pregnancy, antepartum    Infection    UTI   Obesity    Skin infection    Past Surgical History:  Past Surgical History:  Procedure Laterality Date   BREAST REDUCTION SURGERY Bilateral 2009    Social History:  reports that she has never smoked. She has never used smokeless tobacco. She reports previous alcohol use. She reports that she does not use drugs. Family History: family history includes Diabetes in an other family member; Healthy in her mother; Hypertension in an other family member.   HOME MEDICATIONS: Allergies as of 07/13/2020       Reactions   Other    Cantaloupe: itching        Medication List        Accurate as of July 13, 2020  8:01 AM. If you have any questions, ask your nurse or doctor.          Aspirin Low Dose 81 MG chewable tablet Generic drug: aspirin Chew 1 tablet (81 mg total) by mouth daily.   Blood Pressure Kit Devi 1 Device by Does not apply route as needed. ICD10 O09.90 may need larger cuff   docusate sodium 100 MG capsule Commonly known as: COLACE Take 1 capsule (100 mg total) by mouth 2  (two) times daily.   doxylamine (Sleep) 25 MG tablet Commonly known as: UNISOM Take 25 mg by mouth at bedtime as needed.   famotidine 20 MG tablet Commonly known as: PEPCID Take 1 tablet (20 mg total) by mouth 2 (two) times daily.   ferrous sulfate 325 (65 FE) MG EC tablet Take 1 tablet (325 mg total) by mouth every other day.   labetalol 200 MG tablet Commonly known as: NORMODYNE Take 1 tablet by mouth twice a day. What changed:  how much to take how to take this when to take this   Magnesium Oxide 400 MG Caps Take 1 capsule (400 mg total) by mouth 2 (two) times daily.   methimazole 5 MG tablet Commonly known as: TAPAZOLE Take 1 tablet (5 mg total) by mouth 3 (three) times daily.   metroNIDAZOLE 500 MG tablet Commonly known as: FLAGYL Take 1 tablet (500 mg total) by mouth 2 (two) times daily.   ondansetron 4 MG disintegrating tablet Commonly known as: ZOFRAN-ODT Dissolve 1 tablet sublingually every 8 hours as needed What changed:  how much to take when to take this reasons to take this   polyethylene glycol 17 g packet Commonly known as: MIRALAX / GLYCOLAX Take 17 g by mouth daily.   PRENATAL  VITAMINS PO Take 1 tablet by mouth daily.   pyridOXINE 100 MG tablet Commonly known as: VITAMIN B-6 Take 100 mg by mouth daily.   scopolamine 1 MG/3DAYS Commonly known as: TRANSDERM-SCOP Place 1 patch onto the skin every 3 days as directed   ursodiol 300 MG capsule Commonly known as: ACTIGALL Take 1 capsule by mouth 3 times daily.          REVIEW OF SYSTEMS: A comprehensive ROS was conducted with the patient and is negative except as per HPI and below:  ROS     OBJECTIVE:  VS: LMP 03/01/2020    Wt Readings from Last 3 Encounters:  06/27/20 278 lb 14.4 oz (126.5 kg)  06/09/20 260 lb (117.9 kg)  05/03/20 295 lb 12.8 oz (134.2 kg)     EXAM: General: Pt appears well and is in NAD  Hydration: Well-hydrated with moist mucous membranes and good skin  turgor  Eyes: External eye exam normal without stare, lid lag or exophthalmos.  EOM intact.  PERRL.  Ears, Nose, Throat: Hearing: Grossly intact bilaterally Dental: Good dentition  Throat: Clear without mass, erythema or exudate  Neck: General: Supple without adenopathy. Thyroid: Thyroid size normal.  No goiter or nodules appreciated. No thyroid bruit.  Lungs: Clear with good BS bilat with no rales, rhonchi, or wheezes  Heart: Auscultation: RRR.  Abdomen: Normoactive bowel sounds, soft, nontender, without masses or organomegaly palpable  Extremities: Gait and station: Normal gait  Digits and nails: No clubbing, cyanosis, petechiae, or nodes Head and neck: Normal alignment and mobility BL UE: Normal ROM and strength. BL LE: No pretibial edema normal ROM and strength.  Skin: Hair: Texture and amount normal with gender appropriate distribution Skin Inspection: No rashes, acanthosis nigricans/skin tags. No lipohypertrophy Skin Palpation: Skin temperature, texture, and thickness normal to palpation  Neuro: Cranial nerves: II - XII grossly intact  Cerebellar: Normal coordination and movement; no tremor Motor: Normal strength throughout DTRs: 2+ and symmetric in UE without delay in relaxation phase  Mental Status: Judgment, insight: Intact Orientation: Oriented to time, place, and person Memory: Intact for recent and remote events Mood and affect: No depression, anxiety, or agitation     DATA REVIEWED: ***    ASSESSMENT/PLAN/RECOMMENDATIONS:   Hyperthyroidism during pregnancy :    Medications :  Signed electronically by: Mack Guise, MD  [facility]  [address] [place], [place] [address] Phone: [phone] FAX: [phone]   CC: Patient, No Pcp Per (Inactive) No address on file Phone: None Fax: None   Return to Endocrinology clinic as below: Future Appointments  Date Time Provider Catherine   07/13/2020  4:00 PM Sanjeev Main, Melanie Crazier, MD [facility] None  07/19/2020  8:15 AM [facility] NURSE [facility]  07/19/2020  8:30 AM [facility] US3 [facility]  07/23/2020  8:20 AM [facility] LAB [facility]  07/24/2020  3:55 PM Dione Plover, Annice Needy, MD [facility]

## 2020-07-17 ENCOUNTER — Other Ambulatory Visit (HOSPITAL_COMMUNITY): Payer: Self-pay

## 2020-07-17 ENCOUNTER — Other Ambulatory Visit: Payer: Self-pay | Admitting: Lactation Services

## 2020-07-17 MED ORDER — LABETALOL HCL 200 MG PO TABS
200.0000 mg | ORAL_TABLET | Freq: Two times a day (BID) | ORAL | 2 refills | Status: DC
  Filled 2020-07-17 – 2020-07-18 (×2): qty 60, 30d supply, fill #0
  Filled 2020-10-25 – 2020-11-09 (×2): qty 60, 30d supply, fill #1

## 2020-07-18 ENCOUNTER — Inpatient Hospital Stay (HOSPITAL_COMMUNITY)
Admission: AD | Admit: 2020-07-18 | Discharge: 2020-07-18 | Disposition: A | Discharge status: Home / Self Care | Payer: Medicaid Other | Attending: Obstetrics & Gynecology | Admitting: Obstetrics & Gynecology

## 2020-07-18 ENCOUNTER — Other Ambulatory Visit (HOSPITAL_COMMUNITY): Payer: Self-pay | Admitting: Internal Medicine

## 2020-07-18 ENCOUNTER — Other Ambulatory Visit: Payer: Self-pay

## 2020-07-18 ENCOUNTER — Other Ambulatory Visit: Payer: Self-pay | Admitting: Lactation Services

## 2020-07-18 ENCOUNTER — Other Ambulatory Visit (HOSPITAL_COMMUNITY): Payer: Self-pay

## 2020-07-18 DIAGNOSIS — O98512 Other viral diseases complicating pregnancy, second trimester: Secondary | ICD-10-CM | POA: Diagnosis not present

## 2020-07-18 DIAGNOSIS — Z3A19 19 weeks gestation of pregnancy: Secondary | ICD-10-CM | POA: Diagnosis not present

## 2020-07-18 DIAGNOSIS — Z79899 Other long term (current) drug therapy: Secondary | ICD-10-CM | POA: Diagnosis not present

## 2020-07-18 DIAGNOSIS — O0992 Supervision of high risk pregnancy, unspecified, second trimester: Secondary | ICD-10-CM | POA: Insufficient documentation

## 2020-07-18 DIAGNOSIS — Z7982 Long term (current) use of aspirin: Secondary | ICD-10-CM | POA: Diagnosis not present

## 2020-07-18 DIAGNOSIS — U071 COVID-19: Secondary | ICD-10-CM | POA: Diagnosis not present

## 2020-07-18 DIAGNOSIS — O10919 Unspecified pre-existing hypertension complicating pregnancy, unspecified trimester: Secondary | ICD-10-CM

## 2020-07-18 DIAGNOSIS — O0990 Supervision of high risk pregnancy, unspecified, unspecified trimester: Secondary | ICD-10-CM

## 2020-07-18 MED ORDER — SCOPOLAMINE 1 MG/3DAYS TD PT72
1.0000 | MEDICATED_PATCH | TRANSDERMAL | 0 refills | Status: DC
  Filled 2020-07-18: qty 10, 30d supply, fill #0

## 2020-07-18 MED ORDER — ONDANSETRON 4 MG PO TBDP
ORAL_TABLET | ORAL | 0 refills | Status: DC
Start: 2020-07-18 — End: 2020-10-10
  Filled 2020-07-18: qty 24, 8d supply, fill #0

## 2020-07-18 NOTE — MAU Note (Signed)
Was at work today and started feeling weak and tired. Got home and had runny nose and slight headache. Got call from boyfriend who tested positive.

## 2020-07-18 NOTE — Progress Notes (Signed)
Reordered Zofran and Scopolomine patches per verbal order by Dr. Elgie Congo at patients request.

## 2020-07-18 NOTE — MAU Note (Addendum)
K kooistra CNM in Triage to see pt and discuss d/c and plan of care. Pt voiced understanding and felt reassured and voiced understanding of d/c instructions from CNM

## 2020-07-18 NOTE — MAU Provider Note (Signed)
Patient Stacy Fowler is a 30 y.o. G1P0 at 16.6 here after testing positive for COVID-19 today. She called the nurse line at [facility] after she reported SOB and nurse line told her to "Come in and check oxygen and listen to baby". She has a pregnancy complicated by [facility], gallstones, hyperthyroidism.    Due to patient's active infection, and number of patients in waiting room and in MAU rooms, patient was seen and evaluated in Triage.  Patient's symptoms started today; her boyfriend is also sick. She had a positive home antigen test and will have a PCR at [facility] on Thursday.   She reports congestion, sometimes feels difficulty breathing and  mild fever. Occasional HA. She is vaccinated against COVID-19. She is a Adult nurse.  History     CSN: [id number]  Arrival date and time: 07/18/20 1904   None     Chief Complaint  Patient presents with   Covid Positive   URI  This is a new problem. The current episode started today. The problem has been unchanged. The maximum temperature recorded prior to her arrival was 100.4 - 100.9 F. The fever has been present for Less than 1 day. Associated symptoms include congestion, headaches and rhinorrhea.   OB History     Gravida  1   Para      Term      Preterm      AB      Living         SAB      IAB      Ectopic      Multiple      Live Births              Past Medical History:  Diagnosis Date   Chronic hypertension    Gallstone pancreatitis    Genital herpes    Hyperemesis complicating pregnancy, antepartum    Infection    UTI   Obesity    Skin infection     Past Surgical History:  Procedure Laterality Date   BREAST REDUCTION SURGERY Bilateral 2009    Family History  Problem Relation Age of Onset   Healthy Mother    Diabetes Other    Hypertension Other     Social History   Tobacco Use   Smoking status: Never   Smokeless tobacco: Never  Vaping Use   Vaping Use: Never used  Substance Use Topics    Alcohol use: Not Currently    Comment: occasionally   Drug use: No    Allergies:  Allergies  Allergen Reactions   Other     Cantaloupe: itching    Medications Prior to Admission  Medication Sig Dispense Refill Last Dose   aspirin 81 MG chewable tablet Chew 1 tablet (81 mg total) by mouth daily. 30 tablet 0    Blood Pressure Monitoring (BLOOD PRESSURE KIT) DEVI 1 Device by Does not apply route as needed. ICD10 O09.90 may need larger cuff 1 each 0    docusate sodium (COLACE) 100 MG capsule Take 1 capsule (100 mg total) by mouth 2 (two) times daily. 60 capsule 0    doxylamine, Sleep, (UNISOM) 25 MG tablet Take 25 mg by mouth at bedtime as needed.      famotidine (PEPCID) 20 MG tablet Take 1 tablet (20 mg total) by mouth 2 (two) times daily. 60 tablet 0    ferrous sulfate 325 (65 FE) MG EC tablet Take 1 tablet (325 mg total) by mouth every other day.  30 tablet 2    labetalol (NORMODYNE) 200 MG tablet Take 1 tablet (200 mg total) by mouth 2 (two) times daily. 60 tablet 2    Magnesium Oxide 400 MG CAPS Take 1 capsule (400 mg total) by mouth 2 (two) times daily. 15 capsule 0    methimazole (TAPAZOLE) 5 MG tablet Take 1 tablet (5 mg total) by mouth 3 (three) times daily. 90 tablet 0    metroNIDAZOLE (FLAGYL) 500 MG tablet Take 1 tablet (500 mg total) by mouth 2 (two) times daily. 7 tablet 0    ondansetron (ZOFRAN-ODT) 4 MG disintegrating tablet Dissolve 1 tablet by mouth every 8 hours as needed 24 tablet 0    polyethylene glycol (MIRALAX / GLYCOLAX) 17 g packet Take 17 g by mouth daily. 30 each 0    Prenatal Vit-Fe Fumarate-FA (PRENATAL VITAMINS PO) Take 1 tablet by mouth daily.      pyridOXINE (VITAMIN B-6) 100 MG tablet Take 100 mg by mouth daily.      scopolamine (TRANSDERM-SCOP) 1 MG/3DAYS Place 1 patch onto the skin every 3 days as directed 10 patch 0    ursodiol (ACTIGALL) 300 MG capsule Take 1 capsule by mouth 3 times daily. 90 capsule 0     Review of Systems  Constitutional:  Negative.   HENT:  Positive for congestion and rhinorrhea.   Respiratory:  Positive for shortness of breath.   Cardiovascular: Negative.   Gastrointestinal: Negative.   Genitourinary: Negative.   Neurological:  Positive for weakness and headaches.  Physical Exam   Pulse (!) 117, temperature (!) 100.9 F (38.3 C), resp. rate 18, height _0  (1.702 m), weight 130.6 kg, last menstrual period 03/01/2020, SpO2 100 %.  Physical Exam Constitutional:      Appearance: Normal appearance.  Cardiovascular:     Rate and Rhythm: Normal rate.     Pulses: Normal pulses.  Pulmonary:     Effort: Pulmonary effort is normal. No respiratory distress.  Chest:     Chest wall: No tenderness.  Abdominal:     General: Abdomen is flat.     Palpations: Abdomen is soft.  Neurological:     Mental Status: She is alert.    MAU Course  Procedures  MDM FHR dopplered by RN in triage Breath sounds are WNL; her breathing is unlabored and she is able to talk, laugh and ambulate without difficulty O2 sat is 100%.  Patient Vitals for the past 24 hrs:  Temp Pulse Resp SpO2 Height Weight  07/18/20 1915 (!) 100.9 F (38.3 C) (!) 117 18 100 % _1  (1.702 m) 130.6 kg    Assessment and Plan   1. Supervision of high risk pregnancy, antepartum   2. Preexisting hypertension complicating pregnancy, antepartum   3. COVID-19   -patient is well appearing and in no distress. EKG and chest xray not done due to patients reassuring presentation and mild symptoms -discussed isolation guidelines, discuss comfort measures and safe medications in pregnancy.  Explained that she may have HA, fever, runny nose, sore throat over the next 3-4 days; this is a normal part of COVID. She has someone who can go to the pharmacy for her.  -Explained to patient that she should return to MAU if she finds that she cannot take a deep breath or that her SOB worsens. Explained how breathing works and that, with a URI, some upper airway  tightnesss and congestion is normal.   Mervyn Skeeters Ronnie Doo 07/18/2020, 7:44 PM

## 2020-07-19 ENCOUNTER — Other Ambulatory Visit (HOSPITAL_COMMUNITY): Payer: Self-pay

## 2020-07-19 ENCOUNTER — Encounter: Payer: Self-pay | Admitting: *Deleted

## 2020-07-19 ENCOUNTER — Ambulatory Visit: Payer: Medicaid Other

## 2020-07-19 ENCOUNTER — Other Ambulatory Visit: Payer: Medicaid Other

## 2020-07-23 ENCOUNTER — Other Ambulatory Visit: Payer: Medicaid Other

## 2020-07-23 ENCOUNTER — Other Ambulatory Visit: Payer: Self-pay

## 2020-07-23 ENCOUNTER — Telehealth: Payer: Self-pay

## 2020-07-23 DIAGNOSIS — R7303 Prediabetes: Secondary | ICD-10-CM

## 2020-07-23 NOTE — Telephone Encounter (Signed)
Called pt to follow up on appt scheduled tomorrow. Pt tested positive for COVID on 07/18/20. Pt does not have any issues and would prefer to reschedule vs virtual appt. [place] office notified to reschedule.

## 2020-07-24 ENCOUNTER — Encounter: Payer: Medicaid Other | Admitting: Family Medicine

## 2020-08-02 ENCOUNTER — Other Ambulatory Visit: Payer: Self-pay | Admitting: Family Medicine

## 2020-08-02 ENCOUNTER — Ambulatory Visit (HOSPITAL_BASED_OUTPATIENT_CLINIC_OR_DEPARTMENT_OTHER): Payer: Medicaid Other | Admitting: Obstetrics

## 2020-08-02 ENCOUNTER — Ambulatory Visit: Payer: Medicaid Other | Attending: Family Medicine

## 2020-08-02 ENCOUNTER — Ambulatory Visit: Payer: Medicaid Other | Admitting: *Deleted

## 2020-08-02 ENCOUNTER — Other Ambulatory Visit: Payer: Self-pay

## 2020-08-02 VITALS — BP 132/82 | HR 98

## 2020-08-02 DIAGNOSIS — O0990 Supervision of high risk pregnancy, unspecified, unspecified trimester: Secondary | ICD-10-CM

## 2020-08-02 DIAGNOSIS — O99212 Obesity complicating pregnancy, second trimester: Secondary | ICD-10-CM | POA: Diagnosis present

## 2020-08-02 DIAGNOSIS — Z3A22 22 weeks gestation of pregnancy: Secondary | ICD-10-CM

## 2020-08-02 DIAGNOSIS — R7989 Other specified abnormal findings of blood chemistry: Secondary | ICD-10-CM

## 2020-08-02 DIAGNOSIS — R945 Abnormal results of liver function studies: Secondary | ICD-10-CM | POA: Diagnosis present

## 2020-08-02 DIAGNOSIS — O10919 Unspecified pre-existing hypertension complicating pregnancy, unspecified trimester: Secondary | ICD-10-CM

## 2020-08-02 DIAGNOSIS — E079 Disorder of thyroid, unspecified: Secondary | ICD-10-CM | POA: Insufficient documentation

## 2020-08-02 DIAGNOSIS — O99282 Endocrine, nutritional and metabolic diseases complicating pregnancy, second trimester: Secondary | ICD-10-CM | POA: Insufficient documentation

## 2020-08-02 DIAGNOSIS — Z3689 Encounter for other specified antenatal screening: Secondary | ICD-10-CM

## 2020-08-02 DIAGNOSIS — D563 Thalassemia minor: Secondary | ICD-10-CM | POA: Insufficient documentation

## 2020-08-02 DIAGNOSIS — E876 Hypokalemia: Secondary | ICD-10-CM

## 2020-08-02 DIAGNOSIS — K851 Biliary acute pancreatitis without necrosis or infection: Secondary | ICD-10-CM

## 2020-08-02 DIAGNOSIS — O10912 Unspecified pre-existing hypertension complicating pregnancy, second trimester: Secondary | ICD-10-CM

## 2020-08-02 DIAGNOSIS — E0590 Thyrotoxicosis, unspecified without thyrotoxic crisis or storm: Secondary | ICD-10-CM

## 2020-08-02 DIAGNOSIS — E871 Hypo-osmolality and hyponatremia: Secondary | ICD-10-CM | POA: Diagnosis present

## 2020-08-02 NOTE — Progress Notes (Signed)
MFM consult  Stacy Fowler was seen for a detailed fetal anatomy scan due to maternal obesity with a BMI of 48.2, chronic hypertension treated with labetalol, and hyperthyroidism treated with methimazole.  She denies any other significant past medical history and denies any problems in her current pregnancy.    Her cell free DNA test indicated no results due to insufficient fetal DNA.  She was informed that the fetal growth and amniotic fluid level were appropriate for her gestational age.   There were no obvious fetal anomalies noted on today's ultrasound exam.  However, today's exam was limited due to maternal body habitus and the fetal position.  The patient was informed that anomalies may be missed due to technical limitations. If the fetus is in a suboptimal position or maternal habitus is increased, visualization of the fetus in the maternal uterus may be impaired.  Chronic hypertension in pregnancy The implications and management of chronic hypertension in pregnancy was discussed. The patient was advised that should her blood pressures continue to be elevated, the dosage of her antihypertensive medications may need to be increased.  The increased risk of superimposed preeclampsia, an indicated preterm delivery, and possible fetal growth restriction due to chronic hypertension in pregnancy was discussed. We will continue to follow her with monthly growth scans. Weekly fetal testing should be started at around 32 weeks.  To decrease her risk of superimposed preeclampsia, she should start taking a daily baby aspirin (81 mg daily) for preeclampsia prophylaxis as soon as possible.   Hyperthyroidism and pregnancy The implications and management of hyperthyroidism in pregnancy was discussed with the patient.  She was advised that uncontrolled hyperthyroidism is associated with significant adverse pregnancy outcomes in the mother such as the development of preeclampsia and thyroid storm.  In the  fetus, uncontrolled hyperthyroidism may result in fetal tachycardia, fetal growth restriction, and stillbirths.  The patient was advised to continue taking methimazole for the duration of pregnancy.  She should have her thyroid function tests monitored at least once every trimester.  The dose of methimazole should be adjusted based on her thyroid function tests.   The patient was advised that maintaining normal thyroid function throughout her pregnancy will provide her with the most optimal obstetrical outcomes.  Fibroids in pregnancy A 15 to 16 cm fundal fibroid was noted today.  The increased risk of maternal pain issues and fetal growth issues later in her pregnancy due to the fibroids was discussed.   The patient was offered and declined a redraw of her cell free DNA test today.  A follow-up exam was scheduled in 4 weeks to assess the fetal growth and to reassess the fetal anatomy.   A total of 30 minutes was spent counseling and coordinating the care for this patient.  Greater than 50% of the time was spent in direct face-to-face contact.

## 2020-08-03 ENCOUNTER — Other Ambulatory Visit: Payer: Self-pay | Admitting: *Deleted

## 2020-08-03 DIAGNOSIS — O10912 Unspecified pre-existing hypertension complicating pregnancy, second trimester: Secondary | ICD-10-CM

## 2020-08-06 ENCOUNTER — Other Ambulatory Visit (HOSPITAL_COMMUNITY): Payer: Self-pay

## 2020-08-06 ENCOUNTER — Other Ambulatory Visit: Payer: Self-pay | Admitting: Lactation Services

## 2020-08-06 MED ORDER — ASPIRIN 81 MG PO CHEW
81.0000 mg | CHEWABLE_TABLET | Freq: Every day | ORAL | 6 refills | Status: DC
  Filled 2020-08-06 – 2020-08-10 (×2): qty 30, 30d supply, fill #0

## 2020-08-08 ENCOUNTER — Other Ambulatory Visit (HOSPITAL_COMMUNITY): Payer: Self-pay | Admitting: Internal Medicine

## 2020-08-10 ENCOUNTER — Other Ambulatory Visit (HOSPITAL_COMMUNITY): Payer: Self-pay

## 2020-08-10 ENCOUNTER — Other Ambulatory Visit (HOSPITAL_COMMUNITY): Payer: Self-pay | Admitting: Internal Medicine

## 2020-08-13 ENCOUNTER — Other Ambulatory Visit (HOSPITAL_COMMUNITY): Payer: Self-pay

## 2020-08-14 ENCOUNTER — Encounter: Payer: Self-pay | Admitting: Family Medicine

## 2020-08-14 ENCOUNTER — Other Ambulatory Visit: Payer: Self-pay

## 2020-08-14 ENCOUNTER — Ambulatory Visit (INDEPENDENT_AMBULATORY_CARE_PROVIDER_SITE_OTHER): Payer: Medicaid Other | Admitting: Family Medicine

## 2020-08-14 ENCOUNTER — Other Ambulatory Visit (HOSPITAL_COMMUNITY): Payer: Self-pay

## 2020-08-14 VITALS — BP 91/63 | HR 116 | Wt 276.2 lb

## 2020-08-14 DIAGNOSIS — O10919 Unspecified pre-existing hypertension complicating pregnancy, unspecified trimester: Secondary | ICD-10-CM

## 2020-08-14 DIAGNOSIS — A6000 Herpesviral infection of urogenital system, unspecified: Secondary | ICD-10-CM

## 2020-08-14 DIAGNOSIS — O0990 Supervision of high risk pregnancy, unspecified, unspecified trimester: Secondary | ICD-10-CM

## 2020-08-14 DIAGNOSIS — R945 Abnormal results of liver function studies: Secondary | ICD-10-CM

## 2020-08-14 DIAGNOSIS — E0590 Thyrotoxicosis, unspecified without thyrotoxic crisis or storm: Secondary | ICD-10-CM

## 2020-08-14 DIAGNOSIS — K851 Biliary acute pancreatitis without necrosis or infection: Secondary | ICD-10-CM

## 2020-08-14 DIAGNOSIS — R7989 Other specified abnormal findings of blood chemistry: Secondary | ICD-10-CM

## 2020-08-14 DIAGNOSIS — D259 Leiomyoma of uterus, unspecified: Secondary | ICD-10-CM

## 2020-08-14 MED ORDER — URSODIOL 300 MG PO CAPS
300.0000 mg | ORAL_CAPSULE | Freq: Three times a day (TID) | ORAL | 5 refills | Status: DC
  Filled 2020-08-14: qty 12, 4d supply, fill #0
  Filled 2020-08-17: qty 78, 26d supply, fill #0
  Filled 2020-10-25: qty 90, 30d supply, fill #0

## 2020-08-14 NOTE — Progress Notes (Signed)
   Subjective:  Stacy Fowler is a 30 y.o. G1P0 at 40w5dbeing seen today for ongoing prenatal care.  She is currently monitored for the following issues for this high-risk pregnancy and has GENITAL HERPES; MORBID OBESITY; HYPERTENSION, BENIGN ESSENTIAL; HPV in female; Chlamydial infection; Acute gallstone pancreatitis; Hyperthyroidism; Hypokalemia; Hyponatremia; Abnormal LFTs; AKI (acute kidney injury) ([place]; Supervision of high risk pregnancy, antepartum; Preexisting hypertension complicating pregnancy, antepartum; Genital herpes; Fibroid uterus; and Alpha thalassemia silent carrier on their problem list.  Patient reports no complaints.  Contractions: Not present. Vag. Bleeding: None.  Movement: Present. Denies leaking of fluid.   The following portions of the patient's history were reviewed and updated as appropriate: allergies, current medications, past family history, past medical history, past social history, past surgical history and problem list. Problem list updated.  Objective:   Vitals:   08/14/20 0941  BP: 91/63  Pulse: (!) 116  Weight: 276 lb 3.2 oz (125.3 kg)    Fetal Status: Fetal Heart Rate (bpm): 148   Movement: Present     General:  Alert, oriented and cooperative. Patient is in no acute distress.  Skin: Skin is warm and dry. No rash noted.   Cardiovascular: Normal heart rate noted  Respiratory: Normal respiratory effort, no problems with respiration noted  Abdomen: Soft, gravid, appropriate for gestational age. Pain/Pressure: Absent     Pelvic: Vag. Bleeding: None     Cervical exam deferred        Extremities: Normal range of motion.  Edema: None  Mental Status: Normal mood and affect. Normal behavior. Normal judgment and thought content.   Urinalysis:      Assessment and Plan:  Pregnancy: G1P0 at 214w5d1. Supervision of high risk pregnancy, antepartum Anatomy scan of baby unremarkable, notable for fibroid Following w MFM Too late AFP New ob A1c elevated at  5.8% but had to cancel early 2hr due to COVID, will reschedule next available - Protein / creatinine ratio, urine  2. Preexisting hypertension complicating pregnancy, antepartum On labetalol 200 BID, BP well controlled UPCR today, for some reason not resulted from last visit BP low, but denies any symptoms On ASA - Protein / creatinine ratio, urine  3. Hyperthyroidism On methimazole '5mg'$  TID Has appt this Friday, plan to check TFT's at that visit  4. Genital herpes simplex, unspecified site No recent outbreaks  5. Uterine leiomyoma, unspecified location Large fibroid Discussed briefly possible complications All questions answered  6. MORBID OBESITY   7. Abnormal LFTs See below  8. Acute gallstone pancreatitis Ursodiol refilled No symptoms recently  Preterm labor symptoms and general obstetric precautions including but not limited to vaginal bleeding, contractions, leaking of fluid and fetal movement were reviewed in detail with the patient. Please refer to After Visit Summary for other counseling recommendations.  [facility]on [facility]11/2020[facility], needs MD.   EcClarnce FlockMD

## 2020-08-14 NOTE — Patient Instructions (Signed)

## 2020-08-15 ENCOUNTER — Other Ambulatory Visit (HOSPITAL_COMMUNITY): Payer: Self-pay

## 2020-08-17 ENCOUNTER — Encounter: Payer: Self-pay | Admitting: Internal Medicine

## 2020-08-17 ENCOUNTER — Other Ambulatory Visit (HOSPITAL_COMMUNITY): Payer: Self-pay

## 2020-08-17 ENCOUNTER — Ambulatory Visit (INDEPENDENT_AMBULATORY_CARE_PROVIDER_SITE_OTHER): Payer: Medicaid Other | Admitting: Internal Medicine

## 2020-08-17 ENCOUNTER — Other Ambulatory Visit: Payer: Self-pay

## 2020-08-17 VITALS — BP 118/66 | HR 102 | Ht 67.0 in | Wt 284.2 lb

## 2020-08-17 DIAGNOSIS — E0590 Thyrotoxicosis, unspecified without thyrotoxic crisis or storm: Secondary | ICD-10-CM

## 2020-08-17 NOTE — Progress Notes (Signed)
Name: Stacy Fowler  MRN/ DOB: [id number], 01/31/1990    Age/ Sex: 30 y.o., female    PCP: Patient, No Pcp Per (Inactive)   Reason for Endocrinology Evaluation: Hyperthyroidism      Date of Initial Endocrinology Evaluation: 08/17/2020     HPI: Stacy Fowler is a 30 y.o. female with unremarkable  past medical history  . The patient presented for initial endocrinology clinic visit on 08/17/2020 for consultative assistance with her Hyperthyroidism.   She was diagnosed with hyperthyroidism during ED evaluation for nausea and vomiting and 05/2020 when she was noted to have a suppressed TSH < 0.01 uIU/mL . She was started on Methimazole 5 mg at the time      She is currently at 24.1 weeks of gestation , this is her first pregnancy  She is expecting a girl  Weight has been increasing  Denies palpitations  Denies tremors  Denies local neck swelling but has odynophagia  Has constipation for the last month    No prior thyroid disease  Methimazole 5 mg TID   Denies FH of thyroid disease   Works at [employer]:  Past Medical History:  Past Medical History:  Diagnosis Date   Chronic hypertension    Gallstone pancreatitis    Genital herpes    Hyperemesis complicating pregnancy, antepartum    Infection    UTI   Obesity    Skin infection    Past Surgical History:  Past Surgical History:  Procedure Laterality Date   BREAST REDUCTION SURGERY Bilateral 2009    Social History:  reports that she has never smoked. She has never used smokeless tobacco. She reports previous alcohol use. She reports that she does not use drugs. Family History: family history includes Diabetes in an other family member; Healthy in her mother; Hypertension in an other family member.   HOME MEDICATIONS: Allergies as of 08/17/2020       Reactions   Other    Cantaloupe: itching        Medication List        Accurate as of August 17, 2020  5:44 PM. If you have any questions, ask your  nurse or doctor.          STOP taking these medications    ferrous sulfate 325 (65 FE) MG EC tablet Stopped by: Dorita Sciara, MD   metroNIDAZOLE 500 MG tablet Commonly known as: FLAGYL Stopped by: Dorita Sciara, MD       TAKE these medications    aspirin 81 MG chewable tablet Chew 1 tablet (81 mg total) by mouth daily.   Blood Pressure Kit Devi 1 Device by Does not apply route as needed. ICD10 O09.90 may need larger cuff   docusate sodium 100 MG capsule Commonly known as: COLACE Take 1 capsule (100 mg total) by mouth 2 (two) times daily.   doxylamine (Sleep) 25 MG tablet Commonly known as: UNISOM Take 25 mg by mouth at bedtime as needed.   famotidine 20 MG tablet Commonly known as: PEPCID Take 1 tablet (20 mg total) by mouth 2 (two) times daily.   labetalol 200 MG tablet Commonly known as: NORMODYNE Take 1 tablet (200 mg total) by mouth 2 (two) times daily.   Magnesium Oxide 400 MG Caps Take 1 capsule (400 mg total) by mouth 2 (two) times daily.   methimazole 5 MG tablet Commonly known as: TAPAZOLE Take 1 tablet (5 mg total) by mouth 3 (three) times  daily.   ondansetron 4 MG disintegrating tablet Commonly known as: ZOFRAN-ODT Dissolve 1 tablet by mouth every 8 hours as needed   polyethylene glycol 17 g packet Commonly known as: MIRALAX / GLYCOLAX Take 17 g by mouth daily.   PRENATAL VITAMINS PO Take 1 tablet by mouth daily.   pyridOXINE 100 MG tablet Commonly known as: VITAMIN B-6 Take 100 mg by mouth daily.   Transderm-Scop 1 MG/3DAYS Generic drug: scopolamine Place 1 patch onto the skin every 3 days as directed   ursodiol 300 MG capsule Commonly known as: ACTIGALL Take 1 capsule by mouth 3 times daily.          REVIEW OF SYSTEMS: A comprehensive ROS was conducted with the patient and is negative except as per HPI    OBJECTIVE:  VS: BP 118/66   Pulse (!) 102   Ht _0  (1.702 m)   Wt 284 lb 3.2 oz (128.9 kg)   LMP  03/01/2020   SpO2 99%   BMI 44.51 kg/m    Wt Readings from Last 3 Encounters:  08/17/20 284 lb 3.2 oz (128.9 kg)  08/14/20 276 lb 3.2 oz (125.3 kg)  07/18/20 288 lb (130.6 kg)     EXAM: General: Pt appears well and is in NAD  Neck: General: Supple without adenopathy. Thyroid: Thyroid size normal.  No goiter or nodules appreciated.   Lungs: Clear with good BS bilat with no rales, rhonchi, or wheezes  Heart: Auscultation: RRR.  Abdomen: Normoactive bowel sounds, soft, nontender, without masses or organomegaly palpable  Extremities:  BL LE: No pretibial edema normal ROM and strength.  Skin: Hair: Texture and amount normal with gender appropriate distribution Skin Inspection: No rashes Skin Palpation: Skin temperature, texture, and thickness normal to palpation  Neuro: Cranial nerves: II - XII grossly intact  Motor: Normal strength throughout DTRs: 2+ and symmetric in UE without delay in relaxation phase  Mental Status: Judgment, insight: Intact Orientation: Oriented to time, place, and person Mood and affect: No depression, anxiety, or agitation     DATA REVIEWED:   Results for BERDIA, LACHMAN (MRN [id number]) as of 08/17/2020 17:44  Ref. Range 06/10/2020 17:30 06/27/2020 16:13  TSH Latest Ref Range: 0.450 - 4.500 uIU/mL <0.010 (L) 1.420  Triiodothyronine,Free,Serum Latest Ref Range: 2.0 - 4.4 pg/mL 8.8 (H)   T4,Free(Direct) Latest Ref Range: 0.61 - 1.12 ng/dL 4.32 (H)     ASSESSMENT/PLAN/RECOMMENDATIONS:   Hyperthyroid during pregnancy:  -She is clinically euthyroid -We discussed the differential diagnosis of Graves' disease, pregnancy related hyperthyroidism versus toxic thyroid nodule - The goal of treatment is to maintain persistent but mild hyperthyroidism in the mother in an attempt to prevent fetal hypothyroidism since the fetal thyroid is more sensitive to the action of thionamide therapy. Overtreatment of maternal hyperthyroidism can cause fetal goiter and primary  hypothyroidism.  -I am going to reduce her methimazole as below  Medications : Decrease methimazole to 5 mg twice daily   Labs in 3 weeks Follow-up in 2 months  Signed electronically by: Mack Guise, MD  [facility]  [address] [place], [place] [address] Phone: [phone] FAX: [phone]   CC: Patient, No Pcp Per (Inactive) No address on file Phone: None Fax: None   Return to Endocrinology clinic as below: Future Appointments  Date Time Provider [employer]  08/24/2020  8:50 AM [facility] LAB [facility]  08/30/2020  9:45 AM [facility] NURSE [facility]  08/30/2020 10:00 AM [facility]  US1 [facility]  09/12/2020  3:55 PM Clarnce Flock, MD [facility]  10/22/2020  7:50 AM Jennifermarie Franzen, Melanie Crazier, MD [facility] None

## 2020-08-17 NOTE — Patient Instructions (Signed)
-   Decrease methimazole to 1 tablet twice a day  - Labs in 3 weeks

## 2020-08-20 ENCOUNTER — Other Ambulatory Visit (HOSPITAL_COMMUNITY): Payer: Self-pay

## 2020-08-22 ENCOUNTER — Ambulatory Visit: Payer: Medicaid Other

## 2020-08-23 ENCOUNTER — Other Ambulatory Visit: Payer: Self-pay | Admitting: General Practice

## 2020-08-23 ENCOUNTER — Other Ambulatory Visit (HOSPITAL_COMMUNITY): Payer: Self-pay

## 2020-08-23 DIAGNOSIS — N179 Acute kidney failure, unspecified: Secondary | ICD-10-CM

## 2020-08-23 DIAGNOSIS — O0990 Supervision of high risk pregnancy, unspecified, unspecified trimester: Secondary | ICD-10-CM

## 2020-08-24 ENCOUNTER — Other Ambulatory Visit: Payer: Self-pay

## 2020-08-24 ENCOUNTER — Other Ambulatory Visit (HOSPITAL_COMMUNITY): Payer: Self-pay

## 2020-08-24 ENCOUNTER — Other Ambulatory Visit: Payer: Medicaid Other

## 2020-08-25 ENCOUNTER — Other Ambulatory Visit (HOSPITAL_COMMUNITY): Payer: Self-pay

## 2020-08-25 LAB — CBC
Hematocrit: 29.8 % — ABNORMAL LOW (ref 34.0–46.6)
Hemoglobin: 9.9 g/dL — ABNORMAL LOW (ref 11.1–15.9)
MCH: 28.2 pg (ref 26.6–33.0)
MCHC: 33.2 g/dL (ref 31.5–35.7)
MCV: 85 fL (ref 79–97)
Platelets: 324 10*3/uL (ref 150–450)
RBC: 3.51 x10E6/uL — ABNORMAL LOW (ref 3.77–5.28)
RDW: 12.7 % (ref 11.7–15.4)
WBC: 9.9 10*3/uL (ref 3.4–10.8)

## 2020-08-25 LAB — HIV ANTIBODY (ROUTINE TESTING W REFLEX): HIV Screen 4th Generation wRfx: NONREACTIVE

## 2020-08-25 LAB — RPR: RPR Ser Ql: NONREACTIVE

## 2020-08-29 ENCOUNTER — Other Ambulatory Visit (HOSPITAL_COMMUNITY): Payer: Self-pay

## 2020-08-29 ENCOUNTER — Other Ambulatory Visit: Payer: Self-pay | Admitting: Family Medicine

## 2020-08-29 DIAGNOSIS — O99012 Anemia complicating pregnancy, second trimester: Secondary | ICD-10-CM | POA: Insufficient documentation

## 2020-08-29 MED ORDER — FERROUS SULFATE 325 (65 FE) MG PO TBEC
325.0000 mg | DELAYED_RELEASE_TABLET | ORAL | 2 refills | Status: DC
  Filled 2020-08-29: qty 30, 60d supply, fill #0

## 2020-08-30 ENCOUNTER — Other Ambulatory Visit: Payer: Self-pay | Admitting: *Deleted

## 2020-08-30 ENCOUNTER — Other Ambulatory Visit: Payer: Self-pay

## 2020-08-30 ENCOUNTER — Ambulatory Visit: Payer: Medicaid Other | Attending: Obstetrics and Gynecology

## 2020-08-30 ENCOUNTER — Other Ambulatory Visit: Payer: Self-pay | Admitting: General Practice

## 2020-08-30 ENCOUNTER — Ambulatory Visit: Payer: Medicaid Other | Admitting: *Deleted

## 2020-08-30 VITALS — BP 119/66 | HR 102

## 2020-08-30 DIAGNOSIS — E079 Disorder of thyroid, unspecified: Secondary | ICD-10-CM | POA: Diagnosis not present

## 2020-08-30 DIAGNOSIS — Z363 Encounter for antenatal screening for malformations: Secondary | ICD-10-CM | POA: Diagnosis not present

## 2020-08-30 DIAGNOSIS — O10012 Pre-existing essential hypertension complicating pregnancy, second trimester: Secondary | ICD-10-CM

## 2020-08-30 DIAGNOSIS — O99282 Endocrine, nutritional and metabolic diseases complicating pregnancy, second trimester: Secondary | ICD-10-CM | POA: Diagnosis not present

## 2020-08-30 DIAGNOSIS — E0590 Thyrotoxicosis, unspecified without thyrotoxic crisis or storm: Secondary | ICD-10-CM

## 2020-08-30 DIAGNOSIS — O10912 Unspecified pre-existing hypertension complicating pregnancy, second trimester: Secondary | ICD-10-CM | POA: Diagnosis not present

## 2020-08-30 DIAGNOSIS — O0990 Supervision of high risk pregnancy, unspecified, unspecified trimester: Secondary | ICD-10-CM | POA: Diagnosis present

## 2020-08-30 DIAGNOSIS — O10919 Unspecified pre-existing hypertension complicating pregnancy, unspecified trimester: Secondary | ICD-10-CM | POA: Insufficient documentation

## 2020-08-30 DIAGNOSIS — D563 Thalassemia minor: Secondary | ICD-10-CM | POA: Insufficient documentation

## 2020-08-30 DIAGNOSIS — O322XX0 Maternal care for transverse and oblique lie, not applicable or unspecified: Secondary | ICD-10-CM

## 2020-08-30 DIAGNOSIS — Z3A26 26 weeks gestation of pregnancy: Secondary | ICD-10-CM

## 2020-08-30 DIAGNOSIS — O99212 Obesity complicating pregnancy, second trimester: Secondary | ICD-10-CM

## 2020-08-31 ENCOUNTER — Other Ambulatory Visit: Payer: Medicaid Other

## 2020-08-31 DIAGNOSIS — O0990 Supervision of high risk pregnancy, unspecified, unspecified trimester: Secondary | ICD-10-CM

## 2020-09-01 LAB — GLUCOSE TOLERANCE, 2 HOURS W/ 1HR
Glucose, 1 hour: 112 mg/dL (ref 65–179)
Glucose, 2 hour: 56 mg/dL — ABNORMAL LOW (ref 65–152)
Glucose, Fasting: 84 mg/dL (ref 65–91)

## 2020-09-12 ENCOUNTER — Encounter: Payer: Medicaid Other | Admitting: Family Medicine

## 2020-09-19 ENCOUNTER — Other Ambulatory Visit: Payer: Self-pay

## 2020-09-19 NOTE — Progress Notes (Signed)
Opened in error

## 2020-09-24 ENCOUNTER — Ambulatory Visit (INDEPENDENT_AMBULATORY_CARE_PROVIDER_SITE_OTHER): Payer: Medicaid Other | Admitting: Obstetrics & Gynecology

## 2020-09-24 ENCOUNTER — Other Ambulatory Visit: Payer: Self-pay

## 2020-09-24 VITALS — BP 128/71 | HR 96 | Wt 282.6 lb

## 2020-09-24 DIAGNOSIS — Z23 Encounter for immunization: Secondary | ICD-10-CM

## 2020-09-24 DIAGNOSIS — O10919 Unspecified pre-existing hypertension complicating pregnancy, unspecified trimester: Secondary | ICD-10-CM

## 2020-09-24 DIAGNOSIS — E0590 Thyrotoxicosis, unspecified without thyrotoxic crisis or storm: Secondary | ICD-10-CM

## 2020-09-24 DIAGNOSIS — O0990 Supervision of high risk pregnancy, unspecified, unspecified trimester: Secondary | ICD-10-CM

## 2020-09-24 NOTE — Progress Notes (Signed)
   PRENATAL VISIT NOTE  Subjective:  Stacy Fowler is a 30 y.o. G1P0 at 73w4dbeing seen today for ongoing prenatal care.  She is currently monitored for the following issues for this high-risk pregnancy and has GENITAL HERPES; MORBID OBESITY; HYPERTENSION, BENIGN ESSENTIAL; HPV in female; Chlamydial infection; Acute gallstone pancreatitis; Hyperthyroidism; Hypokalemia; Hyponatremia; Abnormal LFTs; AKI (acute kidney injury) ([place]; Supervision of high risk pregnancy, antepartum; Preexisting hypertension complicating pregnancy, antepartum; Genital herpes; Fibroid uterus; Alpha thalassemia silent carrier; and Anemia during pregnancy in second trimester on their problem list.  Patient reports no complaints.  Contractions: Not present. Vag. Bleeding: None.  Movement: Present. Denies leaking of fluid.   The following portions of the patient's history were reviewed and updated as appropriate: allergies, current medications, past family history, past medical history, past social history, past surgical history and problem list.   Objective:   Vitals:   09/24/20 1553  BP: 128/71  Pulse: 96  Weight: 282 lb 9.6 oz (128.2 kg)    Fetal Status: Fetal Heart Rate (bpm): 157   Movement: Present     General:  Alert, oriented and cooperative. Patient is in no acute distress.  Skin: Skin is warm and dry. No rash noted.   Cardiovascular: Normal heart rate noted  Respiratory: Normal respiratory effort, no problems with respiration noted  Abdomen: Soft, gravid, appropriate for gestational age.  Pain/Pressure: Absent     Pelvic: Cervical exam deferred        Extremities: Normal range of motion.  Edema: None  Mental Status: Normal mood and affect. Normal behavior. Normal judgment and thought content.   Assessment and Plan:  Pregnancy: G1P0 at 228w4d. Supervision of high risk pregnancy, antepartum Routine vaccination - Flu Vaccine QUAD 83m68mo (Fluarix, Fluzone & Alfiuria Quad PF) - Tdap vaccine greater than  or equal to 7yo IM  2. Preexisting hypertension complicating pregnancy, antepartum Labetalol 200 mg BID  3. MORBID OBESITY Body mass index is 44.26 kg/m.   4. Hyperthyroidism Normal testing 06/2020, has f/u next month with endocrinology  Preterm labor symptoms and general obstetric precautions including but not limited to vaginal bleeding, contractions, leaking of fluid and fetal movement were reviewed in detail with the patient. Please refer to After Visit Summary for other counseling recommendations.   No follow-ups on file.  Future Appointments  Date Time Provider [place]/20/2022  7:15 AM [facility]/20/2022  7:30 AM [facility] US[facility]  7:50 AM Shamleffer, IbtMelanie CrazierD [facility] None    JamEmeterio ReeveD

## 2020-09-24 NOTE — Progress Notes (Signed)
TDaP vaccine was administered into right deltoid without any complications.   Flu vaccine was administered into left deltoid without any complications.    All questions and concerns was addressed.   Zella Richer, [place]   09/24/20

## 2020-09-26 ENCOUNTER — Other Ambulatory Visit (HOSPITAL_COMMUNITY): Payer: Self-pay

## 2020-10-01 ENCOUNTER — Ambulatory Visit: Payer: Medicaid Other

## 2020-10-02 ENCOUNTER — Other Ambulatory Visit: Payer: Self-pay

## 2020-10-02 ENCOUNTER — Other Ambulatory Visit: Payer: Self-pay | Admitting: *Deleted

## 2020-10-02 ENCOUNTER — Ambulatory Visit: Payer: Medicaid Other | Admitting: *Deleted

## 2020-10-02 ENCOUNTER — Ambulatory Visit: Payer: Medicaid Other | Attending: Obstetrics

## 2020-10-02 VITALS — BP 126/59 | HR 106

## 2020-10-02 DIAGNOSIS — A6000 Herpesviral infection of urogenital system, unspecified: Secondary | ICD-10-CM | POA: Diagnosis present

## 2020-10-02 DIAGNOSIS — O10919 Unspecified pre-existing hypertension complicating pregnancy, unspecified trimester: Secondary | ICD-10-CM | POA: Diagnosis present

## 2020-10-02 DIAGNOSIS — E0590 Thyrotoxicosis, unspecified without thyrotoxic crisis or storm: Secondary | ICD-10-CM | POA: Diagnosis present

## 2020-10-02 DIAGNOSIS — O10912 Unspecified pre-existing hypertension complicating pregnancy, second trimester: Secondary | ICD-10-CM | POA: Diagnosis present

## 2020-10-02 DIAGNOSIS — D563 Thalassemia minor: Secondary | ICD-10-CM

## 2020-10-02 DIAGNOSIS — O0990 Supervision of high risk pregnancy, unspecified, unspecified trimester: Secondary | ICD-10-CM

## 2020-10-02 DIAGNOSIS — O99282 Endocrine, nutritional and metabolic diseases complicating pregnancy, second trimester: Secondary | ICD-10-CM | POA: Diagnosis present

## 2020-10-02 DIAGNOSIS — O10013 Pre-existing essential hypertension complicating pregnancy, third trimester: Secondary | ICD-10-CM | POA: Diagnosis not present

## 2020-10-02 DIAGNOSIS — O99283 Endocrine, nutritional and metabolic diseases complicating pregnancy, third trimester: Secondary | ICD-10-CM

## 2020-10-02 DIAGNOSIS — Z3A30 30 weeks gestation of pregnancy: Secondary | ICD-10-CM | POA: Diagnosis not present

## 2020-10-02 DIAGNOSIS — O10913 Unspecified pre-existing hypertension complicating pregnancy, third trimester: Secondary | ICD-10-CM

## 2020-10-02 DIAGNOSIS — Z3689 Encounter for other specified antenatal screening: Secondary | ICD-10-CM

## 2020-10-03 ENCOUNTER — Other Ambulatory Visit: Payer: Self-pay | Admitting: Family Medicine

## 2020-10-03 ENCOUNTER — Other Ambulatory Visit: Payer: Self-pay | Admitting: Obstetrics and Gynecology

## 2020-10-03 ENCOUNTER — Other Ambulatory Visit (HOSPITAL_COMMUNITY): Payer: Self-pay

## 2020-10-04 ENCOUNTER — Other Ambulatory Visit (HOSPITAL_COMMUNITY): Payer: Self-pay

## 2020-10-04 ENCOUNTER — Other Ambulatory Visit: Payer: Self-pay | Admitting: Obstetrics and Gynecology

## 2020-10-04 ENCOUNTER — Other Ambulatory Visit: Payer: Self-pay | Admitting: Family Medicine

## 2020-10-04 MED ORDER — VALACYCLOVIR HCL 500 MG PO TABS
ORAL_TABLET | ORAL | 2 refills | Status: DC
  Filled 2020-10-04: qty 10, 5d supply, fill #0

## 2020-10-08 ENCOUNTER — Other Ambulatory Visit (HOSPITAL_COMMUNITY): Payer: Self-pay

## 2020-10-08 MED FILL — Scopolamine TD Patch 72HR 1 MG/3DAYS: TRANSDERMAL | 30 days supply | Qty: 10 | Fill #0 | Status: AC

## 2020-10-09 ENCOUNTER — Other Ambulatory Visit (HOSPITAL_COMMUNITY): Payer: Self-pay

## 2020-10-10 ENCOUNTER — Other Ambulatory Visit (HOSPITAL_COMMUNITY): Payer: Self-pay

## 2020-10-10 ENCOUNTER — Other Ambulatory Visit: Payer: Self-pay | Admitting: Obstetrics and Gynecology

## 2020-10-11 ENCOUNTER — Other Ambulatory Visit (HOSPITAL_COMMUNITY): Payer: Self-pay

## 2020-10-11 MED ORDER — ONDANSETRON 4 MG PO TBDP
ORAL_TABLET | ORAL | 0 refills | Status: DC
  Filled 2020-10-11: qty 24, 8d supply, fill #0

## 2020-10-18 ENCOUNTER — Other Ambulatory Visit: Payer: Medicaid Other

## 2020-10-19 ENCOUNTER — Ambulatory Visit: Payer: Medicaid Other | Admitting: *Deleted

## 2020-10-19 ENCOUNTER — Ambulatory Visit (INDEPENDENT_AMBULATORY_CARE_PROVIDER_SITE_OTHER): Payer: Medicaid Other | Admitting: Family Medicine

## 2020-10-19 ENCOUNTER — Ambulatory Visit (INDEPENDENT_AMBULATORY_CARE_PROVIDER_SITE_OTHER): Payer: Medicaid Other

## 2020-10-19 ENCOUNTER — Encounter: Payer: Self-pay | Admitting: Family Medicine

## 2020-10-19 ENCOUNTER — Other Ambulatory Visit (HOSPITAL_COMMUNITY): Payer: Self-pay

## 2020-10-19 ENCOUNTER — Encounter: Payer: Self-pay | Admitting: *Deleted

## 2020-10-19 ENCOUNTER — Other Ambulatory Visit: Payer: Self-pay

## 2020-10-19 VITALS — BP 112/65 | HR 97 | Wt 274.4 lb

## 2020-10-19 DIAGNOSIS — A609 Anogenital herpesviral infection, unspecified: Secondary | ICD-10-CM

## 2020-10-19 DIAGNOSIS — E0590 Thyrotoxicosis, unspecified without thyrotoxic crisis or storm: Secondary | ICD-10-CM

## 2020-10-19 DIAGNOSIS — O10919 Unspecified pre-existing hypertension complicating pregnancy, unspecified trimester: Secondary | ICD-10-CM | POA: Diagnosis not present

## 2020-10-19 DIAGNOSIS — O99012 Anemia complicating pregnancy, second trimester: Secondary | ICD-10-CM

## 2020-10-19 DIAGNOSIS — R7989 Other specified abnormal findings of blood chemistry: Secondary | ICD-10-CM

## 2020-10-19 DIAGNOSIS — Z3A33 33 weeks gestation of pregnancy: Secondary | ICD-10-CM | POA: Diagnosis not present

## 2020-10-19 DIAGNOSIS — D259 Leiomyoma of uterus, unspecified: Secondary | ICD-10-CM

## 2020-10-19 DIAGNOSIS — O0990 Supervision of high risk pregnancy, unspecified, unspecified trimester: Secondary | ICD-10-CM

## 2020-10-19 DIAGNOSIS — K851 Biliary acute pancreatitis without necrosis or infection: Secondary | ICD-10-CM

## 2020-10-19 MED ORDER — VALACYCLOVIR HCL 500 MG PO TABS
500.0000 mg | ORAL_TABLET | Freq: Two times a day (BID) | ORAL | 2 refills | Status: DC
  Filled 2020-10-19 – 2020-11-09 (×2): qty 60, 30d supply, fill #0
  Filled 2021-08-01 – 2021-09-24 (×2): qty 60, 30d supply, fill #1

## 2020-10-19 NOTE — Progress Notes (Signed)
Weight loss (8 lb) since 9/12.  She states her appetite has decreased. Last [place] for growth done 9/20, next is scheduled on 10/18.   Pt is unsure whether she should be taking Ursodiol.

## 2020-10-19 NOTE — Progress Notes (Deleted)
Name: Stacy Fowler  MRN/ DOB: [id number], March 09, 1990    Age/ Sex: 30 y.o., female     PCP: Patient, No Pcp Per (Inactive)   Reason for Endocrinology Evaluation: Hyperthyroid     Initial Endocrinology Clinic Visit: 08/17/2020    PATIENT IDENTIFIER: Ms. Stacy Fowler is a 30 y.o., female with a past medical history of hyperthyroid. She has followed with [facility] since 08/17/2020 for consultative assistance with management of her hyperthyroid.   HISTORICAL SUMMARY:   She was diagnosed with hyperthyroidism during ED evaluation for nausea and vomiting and 05/2020 when she was noted to have a suppressed TSH < 0.01 uIU/mL . She was started on Methimazole 5 mg at the time    On her initial visit to our clinic she was 24.1 weeks of gestation, this was her first pregnancy  Works at [employer]:     Today (10/22/2020):  Ms. Stacy Fowler is here for a follow-up on hyperthyroidism during pregnancy   She was supposed to have labs in September but there is none  Currently at 33.4 weeks of gestation-expecting baby girl   Methimazole 5 mg twice daily  HISTORY:  Past Medical History:  Past Medical History:  Diagnosis Date   Chronic hypertension    Gallstone pancreatitis    Genital herpes    Hyperemesis complicating pregnancy, antepartum    Infection    UTI   Obesity    Skin infection    Past Surgical History:  Past Surgical History:  Procedure Laterality Date   BREAST REDUCTION SURGERY Bilateral 2009   Social History:  reports that she has never smoked. She has never used smokeless tobacco. She reports that she does not currently use alcohol. She reports that she does not use drugs. Family History:  Family History  Problem Relation Age of Onset   Healthy Mother    Diabetes Other    Hypertension Other      HOME MEDICATIONS: Allergies as of 10/22/2020       Reactions   Other    Cantaloupe: itching        Medication List        Accurate as of  October 22, 2020  7:10 AM. If you have any questions, ask your nurse or doctor.          aspirin 81 MG chewable tablet Chew 1 tablet (81 mg total) by mouth daily.   Blood Pressure Kit Devi 1 Device by Does not apply route as needed. ICD10 O09.90 may need larger cuff   docusate sodium 100 MG capsule Commonly known as: COLACE Take 1 capsule (100 mg total) by mouth 2 (two) times daily.   doxylamine (Sleep) 25 MG tablet Commonly known as: UNISOM Take 25 mg by mouth at bedtime as needed.   famotidine 20 MG tablet Commonly known as: PEPCID Take 1 tablet (20 mg total) by mouth 2 (two) times daily.   ferrous sulfate 325 (65 FE) MG EC tablet Take 1 tablet (325 mg total) by mouth every other day.   labetalol 200 MG tablet Commonly known as: NORMODYNE Take 1 tablet (200 mg total) by mouth 2 (two) times daily.   methimazole 5 MG tablet Commonly known as: TAPAZOLE Take 1 tablet (5 mg total) by mouth 3 (three) times daily.   ondansetron 4 MG disintegrating tablet Commonly known as: ZOFRAN-ODT Dissolve 1 tablet by mouth every 8 hours as needed   PRENATAL VITAMINS PO Take 1 tablet by mouth daily.   pyridOXINE 100  MG tablet Commonly known as: VITAMIN B-6 Take 100 mg by mouth daily.   Transderm-Scop 1 MG/3DAYS Generic drug: scopolamine Place 1 patch onto the skin every 3 days as directed   ursodiol 300 MG capsule Commonly known as: ACTIGALL Take 1 capsule by mouth 3 times daily.   valACYclovir 500 MG tablet Commonly known as: Valtrex Take 1 tablet (500 mg total) by mouth 2 (two) times daily.          OBJECTIVE:   PHYSICAL EXAM: VS: LMP 03/01/2020    EXAM: General: Pt appears well and is in NAD  Hydration: Well-hydrated with moist mucous membranes and good skin turgor  Eyes: External eye exam normal without stare, lid lag or exophthalmos.  EOM intact.  PERRL.  Ears, Nose, Throat: Hearing: Grossly intact bilaterally Dental: Good dentition  Throat: Clear without  mass, erythema or exudate  Neck: General: Supple without adenopathy. Thyroid: Thyroid size normal.  No goiter or nodules appreciated. No thyroid bruit.  Lungs: Clear with good BS bilat with no rales, rhonchi, or wheezes  Heart: Auscultation: RRR.  Abdomen: Normoactive bowel sounds, soft, nontender, without masses or organomegaly palpable  Extremities: Gait and station: Normal gait  Digits and nails: No clubbing, cyanosis, petechiae, or nodes Head and neck: Normal alignment and mobility BL UE: Normal ROM and strength. BL LE: No pretibial edema normal ROM and strength.  Skin: Hair: Texture and amount normal with gender appropriate distribution Skin Inspection: No rashes, acanthosis nigricans/skin tags. No lipohypertrophy Skin Palpation: Skin temperature, texture, and thickness normal to palpation  Neuro: Cranial nerves: II - XII grossly intact  Cerebellar: Normal coordination and movement; no tremor Motor: Normal strength throughout DTRs: 2+ and symmetric in UE without delay in relaxation phase  Mental Status: Judgment, insight: Intact Orientation: Oriented to time, place, and person Memory: Intact for recent and remote events Mood and affect: No depression, anxiety, or agitation     DATA REVIEWED: ***    ASSESSMENT / PLAN / RECOMMENDATIONS:   ***  Plan: ***    Medications   ***   Signed electronically by: Mack Guise, MD  [facility]  [address] [place], [place] [address] Phone: [phone] FAX: [phone]      CC: Patient, No Pcp Per (Inactive) No address on file Phone: None  Fax: None   Return to Endocrinology clinic as below: Future Appointments  Date Time Provider [place]  10/22/2020  7:50 AM Avani Sensabaugh, Melanie Crazier, MD [facility] None  10/25/2020  8:15 AM [facility] NST [facility]  10/25/2020 10:35 AM Woodroe Mode, MD [facility]  10/30/2020  7:30 AM [facility] NURSE  [facility]  10/30/2020  7:45 AM [facility] US4 [facility]  11/02/2020 10:35 AM Clarnce Flock, MD [facility]  11/08/2020  8:15 AM [facility] NST [facility]  11/08/2020  9:15 AM Woodroe Mode, MD [facility]

## 2020-10-19 NOTE — Progress Notes (Signed)
   Subjective:  Stacy Fowler is a 30 y.o. G1P0 at [redacted]w[redacted]d being seen today for ongoing prenatal care.  She is currently monitored for the following issues for this high-risk pregnancy and has HSV (herpes simplex virus) anogenital infection; MORBID OBESITY; HYPERTENSION, BENIGN ESSENTIAL; HPV in female; Chlamydial infection; Acute gallstone pancreatitis; Hyperthyroidism; Hypokalemia; Hyponatremia; Abnormal LFTs; AKI (acute kidney injury) ([place]); Supervision of high risk pregnancy, antepartum; Preexisting hypertension complicating pregnancy, antepartum; Genital herpes; Fibroid uterus; Alpha thalassemia silent carrier; and Anemia during pregnancy in second trimester on their problem list.  Patient reports no complaints.  Contractions: Irregular. Vag. Bleeding: None.  Movement: Present. Denies leaking of fluid.   The following portions of the patient's history were reviewed and updated as appropriate: allergies, current medications, past family history, past medical history, past social history, past surgical history and problem list. Problem list updated.  Objective:   Vitals:   10/19/20 1052  BP: 112/65  Pulse: 97  Weight: 274 lb 6.4 oz (124.5 kg)    Fetal Status: Fetal Heart Rate (bpm): RNST   Movement: Present     General:  Alert, oriented and cooperative. Patient is in no acute distress.  Skin: Skin is warm and dry. No rash noted.   Cardiovascular: Normal heart rate noted  Respiratory: Normal respiratory effort, no problems with respiration noted  Abdomen: Soft, gravid, appropriate for gestational age. Pain/Pressure: Absent     Pelvic: Vag. Bleeding: None     Cervical exam deferred        Extremities: Normal range of motion.  Edema: None  Mental Status: Normal mood and affect. Normal behavior. Normal judgment and thought content.   Urinalysis:      Assessment and Plan:  Pregnancy: G1P0 at [redacted]w[redacted]d  1. Supervision of high risk pregnancy, antepartum BP normal Reactive NST  2.  Preexisting hypertension complicating pregnancy, antepartum BP normal on labetalol 200mg  BID Engaged in weekly antenatal testing  3. Acute gallstone pancreatitis Asymptomatic Taking ursodiol  4. Hyperthyroidism Reports she is not taking her methimazole Cut down from 5mg  TID to BID Seeing Endo on Monday, recheck TFT's on that day and reassess  5. HSV (herpes simplex virus) anogenital infection Reports outbreak two weeks ago at 31 weeks Fully resolved Suppression sent, discussed possibility of cesarean if lesions at time of delivery  6. Uterine leiomyoma, unspecified location See scans  7. MORBID OBESITY   8. Abnormal LFTs Resolved on most recent check  9. Anemia during pregnancy in second trimester Taking iron every other day  Preterm labor symptoms and general obstetric precautions including but not limited to vaginal bleeding, contractions, leaking of fluid and fetal movement were reviewed in detail with the patient. Please refer to After Visit Summary for other counseling recommendations.  Return in 2 weeks (on 11/02/2020) for [facility], ob visit, needs MD.   Clarnce Flock, MD

## 2020-10-19 NOTE — Patient Instructions (Signed)

## 2020-10-20 ENCOUNTER — Encounter: Payer: Self-pay | Admitting: Radiology

## 2020-10-22 ENCOUNTER — Ambulatory Visit: Payer: Medicaid Other | Admitting: Internal Medicine

## 2020-10-25 ENCOUNTER — Other Ambulatory Visit: Payer: Medicaid Other

## 2020-10-25 ENCOUNTER — Encounter: Payer: Medicaid Other | Admitting: Obstetrics & Gynecology

## 2020-10-25 ENCOUNTER — Other Ambulatory Visit (HOSPITAL_COMMUNITY): Payer: Self-pay

## 2020-10-26 ENCOUNTER — Other Ambulatory Visit (HOSPITAL_COMMUNITY): Payer: Self-pay

## 2020-10-29 ENCOUNTER — Other Ambulatory Visit (HOSPITAL_COMMUNITY): Payer: Self-pay

## 2020-10-30 ENCOUNTER — Ambulatory Visit: Payer: Medicaid Other | Admitting: *Deleted

## 2020-10-30 ENCOUNTER — Encounter: Payer: Self-pay | Admitting: *Deleted

## 2020-10-30 ENCOUNTER — Ambulatory Visit: Payer: Medicaid Other | Attending: Obstetrics and Gynecology

## 2020-10-30 ENCOUNTER — Other Ambulatory Visit: Payer: Self-pay

## 2020-10-30 VITALS — BP 118/68 | HR 123

## 2020-10-30 DIAGNOSIS — O10913 Unspecified pre-existing hypertension complicating pregnancy, third trimester: Secondary | ICD-10-CM | POA: Insufficient documentation

## 2020-10-30 DIAGNOSIS — O10919 Unspecified pre-existing hypertension complicating pregnancy, unspecified trimester: Secondary | ICD-10-CM

## 2020-10-30 DIAGNOSIS — Z3A34 34 weeks gestation of pregnancy: Secondary | ICD-10-CM | POA: Diagnosis not present

## 2020-10-30 DIAGNOSIS — O99283 Endocrine, nutritional and metabolic diseases complicating pregnancy, third trimester: Secondary | ICD-10-CM | POA: Diagnosis present

## 2020-10-30 DIAGNOSIS — Z3689 Encounter for other specified antenatal screening: Secondary | ICD-10-CM | POA: Diagnosis present

## 2020-10-30 DIAGNOSIS — D563 Thalassemia minor: Secondary | ICD-10-CM | POA: Diagnosis present

## 2020-10-30 DIAGNOSIS — E0590 Thyrotoxicosis, unspecified without thyrotoxic crisis or storm: Secondary | ICD-10-CM | POA: Diagnosis present

## 2020-10-30 DIAGNOSIS — O10013 Pre-existing essential hypertension complicating pregnancy, third trimester: Secondary | ICD-10-CM | POA: Diagnosis not present

## 2020-10-30 DIAGNOSIS — O0990 Supervision of high risk pregnancy, unspecified, unspecified trimester: Secondary | ICD-10-CM | POA: Diagnosis present

## 2020-10-30 DIAGNOSIS — E669 Obesity, unspecified: Secondary | ICD-10-CM

## 2020-10-30 DIAGNOSIS — O99213 Obesity complicating pregnancy, third trimester: Secondary | ICD-10-CM

## 2020-10-30 NOTE — Procedures (Signed)
Stacy Fowler [birth year] [redacted]w[redacted]d  Fetus A Non-Stress Test Interpretation for 10/30/20  Indication: Chronic Hypertenstion and Unsatisfactory BPP  Fetal Heart Rate A Mode: External Baseline Rate (A): 150 bpm Variability: Moderate Accelerations: 15 x 15 Decelerations: None Multiple birth?: No  Uterine Activity Mode: Palpation, Toco Contraction Frequency (min): None Resting Tone Palpated: Relaxed Resting Time: Adequate  Interpretation (Fetal Testing) Nonstress Test Interpretation: Reactive Comments: Dr. Donalee Citrin reviewed tracing.

## 2020-10-31 ENCOUNTER — Other Ambulatory Visit: Payer: Self-pay | Admitting: Obstetrics and Gynecology

## 2020-10-31 DIAGNOSIS — Z3689 Encounter for other specified antenatal screening: Secondary | ICD-10-CM

## 2020-10-31 DIAGNOSIS — E0590 Thyrotoxicosis, unspecified without thyrotoxic crisis or storm: Secondary | ICD-10-CM

## 2020-10-31 DIAGNOSIS — O99283 Endocrine, nutritional and metabolic diseases complicating pregnancy, third trimester: Secondary | ICD-10-CM

## 2020-10-31 DIAGNOSIS — O10913 Unspecified pre-existing hypertension complicating pregnancy, third trimester: Secondary | ICD-10-CM

## 2020-11-02 ENCOUNTER — Ambulatory Visit (INDEPENDENT_AMBULATORY_CARE_PROVIDER_SITE_OTHER): Payer: Medicaid Other | Admitting: Family Medicine

## 2020-11-02 ENCOUNTER — Other Ambulatory Visit: Payer: Self-pay

## 2020-11-02 ENCOUNTER — Encounter: Payer: Self-pay | Admitting: Family Medicine

## 2020-11-02 VITALS — BP 114/79 | HR 109 | Wt 274.3 lb

## 2020-11-02 DIAGNOSIS — K851 Biliary acute pancreatitis without necrosis or infection: Secondary | ICD-10-CM

## 2020-11-02 DIAGNOSIS — O99012 Anemia complicating pregnancy, second trimester: Secondary | ICD-10-CM

## 2020-11-02 DIAGNOSIS — O0990 Supervision of high risk pregnancy, unspecified, unspecified trimester: Secondary | ICD-10-CM

## 2020-11-02 DIAGNOSIS — R7989 Other specified abnormal findings of blood chemistry: Secondary | ICD-10-CM

## 2020-11-02 DIAGNOSIS — O10919 Unspecified pre-existing hypertension complicating pregnancy, unspecified trimester: Secondary | ICD-10-CM

## 2020-11-02 DIAGNOSIS — E0590 Thyrotoxicosis, unspecified without thyrotoxic crisis or storm: Secondary | ICD-10-CM

## 2020-11-02 DIAGNOSIS — A6000 Herpesviral infection of urogenital system, unspecified: Secondary | ICD-10-CM

## 2020-11-02 NOTE — Patient Instructions (Signed)

## 2020-11-02 NOTE — Progress Notes (Signed)
   Subjective:  Stacy Fowler is a 30 y.o. G1P0 at [redacted]w[redacted]d being seen today for ongoing prenatal care.  She is currently monitored for the following issues for this high-risk pregnancy and has HSV (herpes simplex virus) anogenital infection; MORBID OBESITY; HYPERTENSION, BENIGN ESSENTIAL; HPV in female; Chlamydial infection; Acute gallstone pancreatitis; Hyperthyroidism; Hypokalemia; Hyponatremia; Abnormal LFTs; AKI (acute kidney injury) ([place]); Supervision of high risk pregnancy, antepartum; Preexisting hypertension complicating pregnancy, antepartum; Genital herpes; Fibroid uterus; Alpha thalassemia silent carrier; and Anemia during pregnancy in second trimester on their problem list.  Patient reports no complaints.  Contractions: Not present. Vag. Bleeding: None.  Movement: Present. Denies leaking of fluid.   The following portions of the patient's history were reviewed and updated as appropriate: allergies, current medications, past family history, past medical history, past social history, past surgical history and problem list. Problem list updated.  Objective:   Vitals:   11/02/20 1101  BP: 114/79  Pulse: (!) 109  Weight: 274 lb 4.8 oz (124.4 kg)    Fetal Status: Fetal Heart Rate (bpm): 154   Movement: Present     General:  Alert, oriented and cooperative. Patient is in no acute distress.  Skin: Skin is warm and dry. No rash noted.   Cardiovascular: Normal heart rate noted  Respiratory: Normal respiratory effort, no problems with respiration noted  Abdomen: Soft, gravid, appropriate for gestational age. Pain/Pressure: Absent     Pelvic: Vag. Bleeding: None     Cervical exam deferred        Extremities: Normal range of motion.  Edema: None  Mental Status: Normal mood and affect. Normal behavior. Normal judgment and thought content.   Urinalysis:      Assessment and Plan:  Pregnancy: G1P0 at [redacted]w[redacted]d  1. Supervision of high risk pregnancy, antepartum BP and FHR normal  2.  Preexisting hypertension complicating pregnancy, antepartum Well controlled on labetalol In weekly testing and following w MFM Last growth 10/30/20, EFW 2700g, 70%, normal AFI, BPP 8/10 Discuss IOL at next visit  3. Hyperthyroidism Admits she did not go to endocrinology and has not been taking methimazole for two months Will get TFTs today  4. Genital herpes simplex, unspecified site Outbreak at 31 weeks On suppresion  5. Acute gallstone pancreatitis Astymptomatic Taking ursodiol  6. Anemia during pregnancy in second trimester Hgb 9.9 last check, taking iron  7. Abnormal LFTs resolved  Preterm labor symptoms and general obstetric precautions including but not limited to vaginal bleeding, contractions, leaking of fluid and fetal movement were reviewed in detail with the patient. Please refer to After Visit Summary for other counseling recommendations.  Return in 1 week (on 11/09/2020) for [facility], ob visit, needs MD.   Clarnce Flock, MD

## 2020-11-03 ENCOUNTER — Other Ambulatory Visit (HOSPITAL_COMMUNITY): Payer: Self-pay

## 2020-11-03 LAB — TSH: TSH: 0.238 u[IU]/mL — ABNORMAL LOW (ref 0.450–4.500)

## 2020-11-03 LAB — T4, FREE: Free T4: 1.07 ng/dL (ref 0.82–1.77)

## 2020-11-07 ENCOUNTER — Encounter: Payer: Medicaid Other | Admitting: Obstetrics & Gynecology

## 2020-11-07 ENCOUNTER — Encounter: Payer: Medicaid Other | Admitting: Family Medicine

## 2020-11-07 ENCOUNTER — Telehealth: Payer: Self-pay | Admitting: Obstetrics & Gynecology

## 2020-11-07 NOTE — Telephone Encounter (Signed)
Patient appointment was rescheduled to Dr.Arnold schedule due to Dr.Eckstat out sick, patient called this morning and refused to come to the appointment because she did not want to see Dr.Arnold so she wanted the appointment canceled.

## 2020-11-08 ENCOUNTER — Ambulatory Visit: Payer: Medicaid Other | Admitting: *Deleted

## 2020-11-08 ENCOUNTER — Encounter: Payer: Medicaid Other | Admitting: Obstetrics & Gynecology

## 2020-11-08 ENCOUNTER — Other Ambulatory Visit (HOSPITAL_COMMUNITY)
Admission: RE | Admit: 2020-11-08 | Discharge: 2020-11-08 | Disposition: A | Discharge status: Home / Self Care | Payer: Medicaid Other | Source: Ambulatory Visit | Attending: Family Medicine | Admitting: Family Medicine

## 2020-11-08 ENCOUNTER — Ambulatory Visit (INDEPENDENT_AMBULATORY_CARE_PROVIDER_SITE_OTHER): Payer: Medicaid Other

## 2020-11-08 ENCOUNTER — Ambulatory Visit (INDEPENDENT_AMBULATORY_CARE_PROVIDER_SITE_OTHER): Payer: Medicaid Other | Admitting: Family Medicine

## 2020-11-08 ENCOUNTER — Other Ambulatory Visit: Payer: Self-pay

## 2020-11-08 VITALS — BP 122/80 | HR 98 | Wt 272.6 lb

## 2020-11-08 DIAGNOSIS — O10919 Unspecified pre-existing hypertension complicating pregnancy, unspecified trimester: Secondary | ICD-10-CM | POA: Diagnosis not present

## 2020-11-08 DIAGNOSIS — E0590 Thyrotoxicosis, unspecified without thyrotoxic crisis or storm: Secondary | ICD-10-CM

## 2020-11-08 DIAGNOSIS — O99213 Obesity complicating pregnancy, third trimester: Secondary | ICD-10-CM

## 2020-11-08 DIAGNOSIS — O0990 Supervision of high risk pregnancy, unspecified, unspecified trimester: Secondary | ICD-10-CM

## 2020-11-08 LAB — OB RESULTS CONSOLE GC/CHLAMYDIA: Gonorrhea: NEGATIVE

## 2020-11-08 NOTE — Progress Notes (Signed)
   PRENATAL VISIT NOTE  Subjective:  Stacy Fowler is a 30 y.o. G1P0 at [redacted]w[redacted]d being seen today for ongoing prenatal care.  She is currently monitored for the following issues for this high-risk pregnancy and has HSV (herpes simplex virus) anogenital infection; MORBID OBESITY; HYPERTENSION, BENIGN ESSENTIAL; HPV in female; Chlamydial infection; Acute gallstone pancreatitis; Hyperthyroidism; Hypokalemia; Hyponatremia; Abnormal LFTs; AKI (acute kidney injury) ([place]); Supervision of high risk pregnancy, antepartum; Preexisting hypertension complicating pregnancy, antepartum; Genital herpes; Fibroid uterus; Alpha thalassemia silent carrier; and Anemia during pregnancy in second trimester on their problem list.  Patient reports  constipation .  Contractions: Not present. Vag. Bleeding: None.  Movement: Present. Denies leaking of fluid.   The following portions of the patient's history were reviewed and updated as appropriate: allergies, current medications, past family history, past medical history, past social history, past surgical history and problem list.   Objective:   Vitals:   11/08/20 0909  BP: 122/80  Pulse: 98  Weight: 272 lb 9.6 oz (123.7 kg)    Fetal Status: Fetal Heart Rate (bpm): RNST Fundal Height: 36 cm Movement: Present  Presentation: Vertex  General:  Alert, oriented and cooperative. Patient is in no acute distress.  Skin: Skin is warm and dry. No rash noted.   Cardiovascular: Normal heart rate noted  Respiratory: Normal respiratory effort, no problems with respiration noted  Abdomen: Soft, gravid, appropriate for gestational age.  Pain/Pressure: Present     Pelvic: Cervical exam deferred      chaperone present for exam  Extremities: Normal range of motion.  Edema: None  Mental Status: Normal mood and affect. Normal behavior. Normal judgment and thought content.   Assessment and Plan:  Pregnancy: G1P0 at [redacted]w[redacted]d 1. Supervision of high risk pregnancy, antepartum Reviewed  constipation. Recommended miralax cleanout- instuctions in AVS - GC/Chlamydia probe amp ()not at [facility] - Strep Gp B NAA  2. Preexisting hypertension complicating pregnancy, antepartum - [place] MFM FETAL BPP WO NON STRESS; Future - IOL at 39 wks  3. Hyperthyroidism Not taking methimazole Last TSH on 10/21 was low and  T4 was WNL Continue to monitor  4. Obesity in pregnancy TWG=-35 lb 6.4 oz (-16.1 kg)   Preterm labor symptoms and general obstetric precautions including but not limited to vaginal bleeding, contractions, leaking of fluid and fetal movement were reviewed in detail with the patient. Please refer to After Visit Summary for other counseling recommendations.   Return in about 1 week (around 11/15/2020) for [facility] appt needed, has NST/BPP @ 2:15.  Future Appointments  Date Time Provider [place]  11/15/2020  2:15 PM [facility] N[facility]  11/20/2020  9:15 AM Clarnce Flock, MD [facility]  11/23/2020 10:30 AM [facility] NURSE [facility]  11/23/2020 10:45 AM [facility] US4 [facility]    Caren Macadam, MD

## 2020-11-08 NOTE — Progress Notes (Signed)
Pt reports having mild constipation despite daily use of Colace. Pt also reports frequency and urgency of urination. She denies painful urination.  [place] for growth done on 10/19.

## 2020-11-08 NOTE — Patient Instructions (Addendum)
You have constipation which is hard stools that are difficult to pass. It is important to have regular bowel movements every 1-3 days that are soft and easy to pass. Hard stools increase your risk of hemorrhoids and are very uncomfortable.   To prevent constipation you can increase the amount of fiber in your diet. Examples of foods with fiber are leafy greens, whole grain breads, oatmeal and other grains.  It is also important to drink at least eight 8oz glass of water everyday.   If you have not has a bowel movement in 4-5 days you made need to clean out your bowel.  This will have establish normal movement through your bowel.    Miralax Clean out Take 8 capfuls of miralax in 64 oz of gatorade. You can use any fluid that appeals to you (gatorade, water, juice) Continue to drink at least eight 8 oz glasses of water throughout the day You can repeat with another 8 capfuls of miralax in 64 oz of gatorade if you are not having a large amount of stools You will need to be at home and close to a bathroom for about 8 hours when you do the above as you may need to go to the bathroom frequently.   After you are cleaned out: - Start Colace100mg  twice daily - Start Miralax once daily - Start a daily fiber supplement like metamucil or citrucel - You can safely use enemas in pregnancy  - if you are having diarrhea you can reduce to Colace once a day or miralax every other day or a 1/2 capful daily.    DOULA LIST   [facility] Doula  [place]  [phone]  [email or web address]  [email or web address]  [place] [phone]  [email or web address]   [place]ious [place]   [email or web address]   ??THE MOTHERLY DOULA?? Lajuana Ripple   [phone]   [email or web address]     [facility] [employer] Doula  Mattie Marlin  [phone]    [email or web address]   [facility]  Doula [facility]  Angie Rosier     [phone]     [email or web address]   Lenoria Farrier: [place] [phone]       [email or web address]  [email or web address]   [place] [phone]   [email or web address]   [place]  Llana Aliment  [phone]  [email or web address]   [email or web address]   [facility]   Burney Gauze   [phone]  [email or web address]   [facility] Doula  Rondel Baton  [phone] [email or web address]  [facility] Doulas  Jessica Bower           [place]     [phone] [email or web address]  [email or web address]   [facility]   Bethel Foxx [phone] [email or web address]   [facility] Doula Services  Abbie Sons     [phone]  [email or web address]  [facility]     [phone]  [email or web address]  Facebook and IG [email or web address] .Vivia [place]  [phone] [email or web address] .Dola Argyle [phone] [email or web address]   Delanna Ahmadi  [phone] [email or web address]     [facility]  [phone] [email or web address]   IG [email or web address]    Henrine Screws    [phone] [email or web address]   Rachel Moulds "[place]" Free  [phone] [email or web address]    Mtende Roll  [phone] [email or web address]   Radonna Ricker  [email or web address]    Crissie Reese    [phone] [email or web address]     Carlean Jews  [phone] [email or web address]    Rigoberto Noel  [phone] [email or web address]    Loomis Rhem    [phone]   [facility] Karie Mainland  [phone] [email or web address]  Doula Mama Hollie [place]  [phone] [email or web address]  [facility] Karie Mainland  [phone] [email or web address]  [facility] Owens Shark [phone]  [email or web address]  [email or web address] Nevins Harris-Jones  [phone] [email or web address] .[email or web address]   Renaee Munda [phone] [email or web address]   Einar Grad [phone] [email or web address]   Lance Muss  [phone] [email or web address]  [employer]  Deri Fuelling  [phone] [email or web address]   Maxwell Caul [phone] [email or web address]  MOOR Divine Myeka Dunn  [email or web address] .[email or web address]   Threasa Alpha [phone] [email or web address]   Freddie Apley [phone] [email or web address] .Laury Axon Randleman [phone] [email or web address]

## 2020-11-09 ENCOUNTER — Other Ambulatory Visit (HOSPITAL_COMMUNITY): Payer: Self-pay

## 2020-11-09 LAB — GC/CHLAMYDIA PROBE AMP (~~LOC~~) NOT AT ARMC
Chlamydia: NEGATIVE
Comment: NEGATIVE
Comment: NORMAL
Neisseria Gonorrhea: NEGATIVE

## 2020-11-10 LAB — STREP GP B NAA: Strep Gp B NAA: POSITIVE — AB

## 2020-11-15 ENCOUNTER — Ambulatory Visit: Payer: Medicaid Other | Admitting: *Deleted

## 2020-11-15 ENCOUNTER — Ambulatory Visit (INDEPENDENT_AMBULATORY_CARE_PROVIDER_SITE_OTHER): Payer: Medicaid Other

## 2020-11-15 ENCOUNTER — Other Ambulatory Visit: Payer: Self-pay

## 2020-11-15 VITALS — BP 119/74 | HR 108 | Wt 278.1 lb

## 2020-11-15 DIAGNOSIS — O99213 Obesity complicating pregnancy, third trimester: Secondary | ICD-10-CM

## 2020-11-15 DIAGNOSIS — O10919 Unspecified pre-existing hypertension complicating pregnancy, unspecified trimester: Secondary | ICD-10-CM

## 2020-11-15 DIAGNOSIS — Z3A37 37 weeks gestation of pregnancy: Secondary | ICD-10-CM

## 2020-11-15 NOTE — Progress Notes (Signed)
Pt informed that the ultrasound is considered a limited OB ultrasound and is not intended to be a complete ultrasound exam.  Patient also informed that the ultrasound is not being completed with the intent of assessing for fetal or placental anomalies or any pelvic abnormalities.  Explained that the purpose of today's ultrasound is to assess for presentation, BPP and amniotic fluid volume.  Patient acknowledges the purpose of the exam and the limitations of the study.   Pt reports decreased FM yesterday and today.  She was aware of good FM during BPP and NST.

## 2020-11-15 NOTE — Progress Notes (Signed)
Patient seen and assessed by nursing staff.  Agree with documentation and plan.  NST:  Baseline: 150 bpm, Variability: Good {> 6 bpm), Accelerations: Reactive, and Decelerations: Absent

## 2020-11-17 ENCOUNTER — Other Ambulatory Visit (HOSPITAL_COMMUNITY): Payer: Self-pay

## 2020-11-17 ENCOUNTER — Other Ambulatory Visit: Payer: Self-pay | Admitting: Obstetrics and Gynecology

## 2020-11-17 MED ORDER — ONDANSETRON 4 MG PO TBDP
ORAL_TABLET | ORAL | 0 refills | Status: DC
  Filled 2020-11-17: qty 24, 8d supply, fill #0

## 2020-11-19 ENCOUNTER — Other Ambulatory Visit (HOSPITAL_COMMUNITY): Payer: Self-pay

## 2020-11-19 ENCOUNTER — Other Ambulatory Visit (HOSPITAL_BASED_OUTPATIENT_CLINIC_OR_DEPARTMENT_OTHER): Payer: Self-pay

## 2020-11-20 ENCOUNTER — Other Ambulatory Visit: Payer: Self-pay

## 2020-11-20 ENCOUNTER — Ambulatory Visit (INDEPENDENT_AMBULATORY_CARE_PROVIDER_SITE_OTHER): Payer: Medicaid Other | Admitting: Family Medicine

## 2020-11-20 VITALS — BP 130/80 | HR 104 | Wt 273.3 lb

## 2020-11-20 DIAGNOSIS — O0990 Supervision of high risk pregnancy, unspecified, unspecified trimester: Secondary | ICD-10-CM

## 2020-11-20 DIAGNOSIS — O99012 Anemia complicating pregnancy, second trimester: Secondary | ICD-10-CM

## 2020-11-20 DIAGNOSIS — R350 Frequency of micturition: Secondary | ICD-10-CM

## 2020-11-20 DIAGNOSIS — A6000 Herpesviral infection of urogenital system, unspecified: Secondary | ICD-10-CM

## 2020-11-20 DIAGNOSIS — K851 Biliary acute pancreatitis without necrosis or infection: Secondary | ICD-10-CM

## 2020-11-20 DIAGNOSIS — O2343 Unspecified infection of urinary tract in pregnancy, third trimester: Secondary | ICD-10-CM

## 2020-11-20 DIAGNOSIS — O10919 Unspecified pre-existing hypertension complicating pregnancy, unspecified trimester: Secondary | ICD-10-CM

## 2020-11-20 DIAGNOSIS — E0590 Thyrotoxicosis, unspecified without thyrotoxic crisis or storm: Secondary | ICD-10-CM

## 2020-11-20 LAB — POCT URINALYSIS DIP (DEVICE)
Bilirubin Urine: NEGATIVE
Glucose, UA: NEGATIVE mg/dL
Ketones, ur: NEGATIVE mg/dL
Nitrite: POSITIVE — AB
Protein, ur: NEGATIVE mg/dL
Specific Gravity, Urine: 1.02 (ref 1.005–1.030)
Urobilinogen, UA: 0.2 mg/dL (ref 0.0–1.0)
pH: 7 (ref 5.0–8.0)

## 2020-11-20 NOTE — Patient Instructions (Signed)

## 2020-11-20 NOTE — Progress Notes (Signed)
   Subjective:  Yalonda Sample is a 30 y.o. G1P0 at [redacted]w[redacted]d being seen today for ongoing prenatal care.  She is currently monitored for the following issues for this high-risk pregnancy and has HSV (herpes simplex virus) anogenital infection; MORBID OBESITY; HYPERTENSION, BENIGN ESSENTIAL; HPV in female; Chlamydial infection; Acute gallstone pancreatitis; Hyperthyroidism; Hypokalemia; Hyponatremia; Abnormal LFTs; AKI (acute kidney injury) ([place]); Supervision of high risk pregnancy, antepartum; Preexisting hypertension complicating pregnancy, antepartum; Genital herpes; Fibroid uterus; Alpha thalassemia silent carrier; and Anemia during pregnancy in second trimester on their problem list.  Patient reports no complaints.  Contractions: Not present. Vag. Bleeding: None.  Movement: Present. Denies leaking of fluid.   The following portions of the patient's history were reviewed and updated as appropriate: allergies, current medications, past family history, past medical history, past social history, past surgical history and problem list. Problem list updated.  Objective:   Vitals:   11/20/20 0925 11/20/20 0936  BP: (!) 150/100 130/80  Pulse: (!) 104   Weight: 273 lb 4.8 oz (124 kg)     Fetal Status:     Movement: Present     General:  Alert, oriented and cooperative. Patient is in no acute distress.  Skin: Skin is warm and dry. No rash noted.   Cardiovascular: Normal heart rate noted  Respiratory: Normal respiratory effort, no problems with respiration noted  Abdomen: Soft, gravid, appropriate for gestational age. Pain/Pressure: Present     Pelvic: Vag. Bleeding: None     Cervical exam deferred        Extremities: Normal range of motion.  Edema: None  Mental Status: Normal mood and affect. Normal behavior. Normal judgment and thought content.   Urinalysis:      Assessment and Plan:  Pregnancy: G1P0 at [redacted]w[redacted]d  1. Supervision of high risk pregnancy, antepartum BP and FHR normal IOL not yet  scheduled, form faxed and orders placed Cephalic on recent US - Culture, OB Urine - POC Urinalysis Dipstick OB  2. Urinary frequency UTI symptoms, will obtain OB UCx - Culture, OB Urine - POC Urinalysis Dipstick OB  3. Urine frequency  - Culture, OB Urine - POC Urinalysis Dipstick OB  4. Preexisting hypertension complicating pregnancy, antepartum Well controlled on labetalol On ASA Weekly testing has been reassuring Has follow up growth later this week  5. Hyperthyroidism Not taking methimazole Low TSH with normal T4 last visit  6. Genital herpes simplex, unspecified site Taking valtrex  7. Acute gallstone pancreatitis Episode early in pregnancy Asymptomatic No longer taking ursodiol  8. Anemia during pregnancy in second trimester Hgb 9.9 last check On PO iron  Term labor symptoms and general obstetric precautions including but not limited to vaginal bleeding, contractions, leaking of fluid and fetal movement were reviewed in detail with the patient. Please refer to After Visit Summary for other counseling recommendations.  Return in 1 week (on 11/27/2020) for [facility], ob visit, needs MD.   Clarnce Flock, MD

## 2020-11-21 ENCOUNTER — Other Ambulatory Visit: Payer: Self-pay | Admitting: Advanced Practice Midwife

## 2020-11-21 NOTE — Addendum Note (Signed)
Addended by: Clayton Lefort on: 11/21/2020 08:46 PM   Modules accepted: Orders, SmartSet

## 2020-11-22 LAB — CULTURE, OB URINE

## 2020-11-22 LAB — URINE CULTURE, OB REFLEX

## 2020-11-22 MED ORDER — NITROFURANTOIN MONOHYD MACRO 100 MG PO CAPS
100.0000 mg | ORAL_CAPSULE | Freq: Two times a day (BID) | ORAL | 0 refills | Status: DC
  Filled 2020-11-22: qty 14, 7d supply, fill #0

## 2020-11-22 NOTE — Addendum Note (Signed)
Addended by: Clayton Lefort on: 11/22/2020 07:56 PM   Modules accepted: Orders

## 2020-11-23 ENCOUNTER — Encounter: Payer: Self-pay | Admitting: *Deleted

## 2020-11-23 ENCOUNTER — Ambulatory Visit: Payer: Medicaid Other | Admitting: *Deleted

## 2020-11-23 ENCOUNTER — Other Ambulatory Visit: Payer: Self-pay

## 2020-11-23 ENCOUNTER — Other Ambulatory Visit: Payer: Self-pay | Admitting: Family Medicine

## 2020-11-23 ENCOUNTER — Ambulatory Visit: Payer: Medicaid Other | Attending: Family Medicine

## 2020-11-23 ENCOUNTER — Other Ambulatory Visit (HOSPITAL_COMMUNITY): Payer: Self-pay

## 2020-11-23 VITALS — BP 130/86 | HR 143

## 2020-11-23 DIAGNOSIS — E669 Obesity, unspecified: Secondary | ICD-10-CM | POA: Diagnosis not present

## 2020-11-23 DIAGNOSIS — D563 Thalassemia minor: Secondary | ICD-10-CM | POA: Insufficient documentation

## 2020-11-23 DIAGNOSIS — O99213 Obesity complicating pregnancy, third trimester: Secondary | ICD-10-CM | POA: Diagnosis not present

## 2020-11-23 DIAGNOSIS — O0990 Supervision of high risk pregnancy, unspecified, unspecified trimester: Secondary | ICD-10-CM

## 2020-11-23 DIAGNOSIS — A6000 Herpesviral infection of urogenital system, unspecified: Secondary | ICD-10-CM | POA: Diagnosis present

## 2020-11-23 DIAGNOSIS — Z3A38 38 weeks gestation of pregnancy: Secondary | ICD-10-CM

## 2020-11-23 DIAGNOSIS — O10919 Unspecified pre-existing hypertension complicating pregnancy, unspecified trimester: Secondary | ICD-10-CM

## 2020-11-23 DIAGNOSIS — O10013 Pre-existing essential hypertension complicating pregnancy, third trimester: Secondary | ICD-10-CM

## 2020-11-23 NOTE — Progress Notes (Addendum)
Pt says she has not felt any fetal movement yet today. She has eaten and says this is not normal for her baby but has not phoned her Dr.  Roney Jaffe the last time she felt movement was 10 pm last night. Ultrasonographer made aware.

## 2020-11-23 NOTE — Procedures (Signed)
Stacy Fowler [birth year] [redacted]w[redacted]d  Fetus A Non-Stress Test Interpretation for 11/23/20  Indication: Decreased Fetal Movement  Fetal Heart Rate A Mode: External Baseline Rate (A): 150 bpm Variability: Moderate Accelerations: 15 x 15 Decelerations: None  Uterine Activity Mode: Toco Contraction Frequency (min): occasional Contraction Quality: Mild Resting Tone Palpated: Relaxed Resting Time: Adequate  Interpretation (Fetal Testing) Nonstress Test Interpretation: Reactive Overall Impression: Reassuring for gestational age Comments: reviewed by Dr. Donalee Citrin

## 2020-11-25 ENCOUNTER — Other Ambulatory Visit: Payer: Self-pay | Admitting: Advanced Practice Midwife

## 2020-11-27 ENCOUNTER — Encounter: Payer: Medicaid Other | Admitting: Family Medicine

## 2020-11-29 ENCOUNTER — Other Ambulatory Visit: Payer: Self-pay

## 2020-11-29 ENCOUNTER — Inpatient Hospital Stay (HOSPITAL_COMMUNITY)
Admission: AD | Admit: 2020-11-29 | Discharge: 2020-12-02 | DRG: 806 | Disposition: A | Discharge status: Home / Self Care | Payer: Medicaid Other | Attending: Family Medicine | Admitting: Family Medicine

## 2020-11-29 ENCOUNTER — Encounter (HOSPITAL_COMMUNITY): Payer: Self-pay | Admitting: Family Medicine

## 2020-11-29 ENCOUNTER — Inpatient Hospital Stay (HOSPITAL_COMMUNITY): Payer: Medicaid Other

## 2020-11-29 DIAGNOSIS — O1002 Pre-existing essential hypertension complicating childbirth: Secondary | ICD-10-CM | POA: Diagnosis present

## 2020-11-29 DIAGNOSIS — Z20822 Contact with and (suspected) exposure to covid-19: Secondary | ICD-10-CM | POA: Diagnosis present

## 2020-11-29 DIAGNOSIS — O99824 Streptococcus B carrier state complicating childbirth: Secondary | ICD-10-CM | POA: Diagnosis present

## 2020-11-29 DIAGNOSIS — O9832 Other infections with a predominantly sexual mode of transmission complicating childbirth: Secondary | ICD-10-CM | POA: Diagnosis present

## 2020-11-29 DIAGNOSIS — Z7982 Long term (current) use of aspirin: Secondary | ICD-10-CM | POA: Diagnosis not present

## 2020-11-29 DIAGNOSIS — D563 Thalassemia minor: Secondary | ICD-10-CM | POA: Diagnosis present

## 2020-11-29 DIAGNOSIS — O99820 Streptococcus B carrier state complicating pregnancy: Secondary | ICD-10-CM | POA: Diagnosis not present

## 2020-11-29 DIAGNOSIS — Z0289 Encounter for other administrative examinations: Secondary | ICD-10-CM

## 2020-11-29 DIAGNOSIS — O99214 Obesity complicating childbirth: Secondary | ICD-10-CM | POA: Diagnosis present

## 2020-11-29 DIAGNOSIS — A6000 Herpesviral infection of urogenital system, unspecified: Secondary | ICD-10-CM | POA: Diagnosis present

## 2020-11-29 DIAGNOSIS — O99284 Endocrine, nutritional and metabolic diseases complicating childbirth: Secondary | ICD-10-CM | POA: Diagnosis not present

## 2020-11-29 DIAGNOSIS — Z3A39 39 weeks gestation of pregnancy: Secondary | ICD-10-CM | POA: Diagnosis not present

## 2020-11-29 DIAGNOSIS — O10919 Unspecified pre-existing hypertension complicating pregnancy, unspecified trimester: Secondary | ICD-10-CM | POA: Diagnosis present

## 2020-11-29 DIAGNOSIS — O0990 Supervision of high risk pregnancy, unspecified, unspecified trimester: Secondary | ICD-10-CM

## 2020-11-29 LAB — CBC
HCT: 30.7 % — ABNORMAL LOW (ref 36.0–46.0)
HCT: 35.2 % — ABNORMAL LOW (ref 36.0–46.0)
Hemoglobin: 11.1 g/dL — ABNORMAL LOW (ref 12.0–15.0)
Hemoglobin: 9.9 g/dL — ABNORMAL LOW (ref 12.0–15.0)
MCH: 26.8 pg (ref 26.0–34.0)
MCH: 27.1 pg (ref 26.0–34.0)
MCHC: 31.5 g/dL (ref 30.0–36.0)
MCHC: 32.2 g/dL (ref 30.0–36.0)
MCV: 84.1 fL (ref 80.0–100.0)
MCV: 85 fL (ref 80.0–100.0)
Platelets: 275 10*3/uL (ref 150–400)
Platelets: 359 10*3/uL (ref 150–400)
RBC: 3.65 MIL/uL — ABNORMAL LOW (ref 3.87–5.11)
RBC: 4.14 MIL/uL (ref 3.87–5.11)
RDW: 14.3 % (ref 11.5–15.5)
RDW: 14.3 % (ref 11.5–15.5)
WBC: 12.2 10*3/uL — ABNORMAL HIGH (ref 4.0–10.5)
WBC: 9.9 10*3/uL (ref 4.0–10.5)
nRBC: 0 % (ref 0.0–0.2)
nRBC: 0 % (ref 0.0–0.2)

## 2020-11-29 LAB — COMPREHENSIVE METABOLIC PANEL
ALT: 9 U/L (ref 0–44)
AST: 15 U/L (ref 15–41)
Albumin: 2.8 g/dL — ABNORMAL LOW (ref 3.5–5.0)
Alkaline Phosphatase: 105 U/L (ref 38–126)
Anion gap: 10 (ref 5–15)
BUN: 9 mg/dL (ref 6–20)
CO2: 18 mmol/L — ABNORMAL LOW (ref 22–32)
Calcium: 9.1 mg/dL (ref 8.9–10.3)
Chloride: 104 mmol/L (ref 98–111)
Creatinine, Ser: 0.66 mg/dL (ref 0.44–1.00)
GFR, Estimated: >60 mL/min (ref >60)
Glucose, Bld: 76 mg/dL (ref 70–99)
Potassium: 4.3 mmol/L (ref 3.5–5.1)
Sodium: 132 mmol/L — ABNORMAL LOW (ref 135–145)
Total Bilirubin: 0.3 mg/dL (ref 0.3–1.2)
Total Protein: 6.8 g/dL (ref 6.5–8.1)

## 2020-11-29 LAB — RESP PANEL BY RT-PCR (FLU A&B, COVID) ARPGX2
Influenza A by PCR: NEGATIVE
Influenza B by PCR: NEGATIVE
SARS Coronavirus 2 by RT PCR: NEGATIVE

## 2020-11-29 LAB — PROTEIN / CREATININE RATIO, URINE
Creatinine, Urine: 184.12 mg/dL
Protein Creatinine Ratio: 0.22 mg/mg{Cre} — ABNORMAL HIGH (ref 0.00–0.15)
Total Protein, Urine: 40 mg/dL

## 2020-11-29 LAB — ABO/RH: ABO/RH(D): O POS

## 2020-11-29 MED ORDER — LABETALOL HCL 200 MG PO TABS: 200.0000 mg | ORAL_TABLET | Freq: Two times a day (BID) | ORAL | Status: DC

## 2020-11-29 MED ORDER — ACETAMINOPHEN 325 MG PO TABS: 650.0000 mg | ORAL_TABLET | ORAL | Status: DC | PRN

## 2020-11-29 MED ORDER — VALACYCLOVIR HCL 500 MG PO TABS
500.0000 mg | ORAL_TABLET | Freq: Two times a day (BID) | ORAL | Status: DC
  Administered 2020-11-29 – 2020-11-30 (×2): 500 mg via ORAL
  Filled 2020-11-29 (×2): qty 1

## 2020-11-29 MED ORDER — SODIUM CHLORIDE 0.9 % IV SOLN
5.0000 10*6.[IU] | Freq: Once | INTRAVENOUS | Status: AC
  Administered 2020-11-29: 15:00:00 5 10*6.[IU] via INTRAVENOUS
  Filled 2020-11-29: qty 5

## 2020-11-29 MED ORDER — FENTANYL CITRATE (PF) 100 MCG/2ML IJ SOLN
50.0000 ug | INTRAMUSCULAR | Status: DC | PRN
  Administered 2020-11-29 (×2): 100 ug via INTRAVENOUS
  Filled 2020-11-29 (×2): qty 2

## 2020-11-29 MED ORDER — EPHEDRINE 5 MG/ML INJ: 10.0000 mg | INTRAVENOUS | Status: DC | PRN

## 2020-11-29 MED ORDER — ONDANSETRON HCL 4 MG/2ML IJ SOLN
4.0000 mg | Freq: Four times a day (QID) | INTRAMUSCULAR | Status: DC | PRN
  Administered 2020-11-29: 15:00:00 4 mg via INTRAVENOUS
  Filled 2020-11-29: qty 2

## 2020-11-29 MED ORDER — FENTANYL-BUPIVACAINE-NACL 0.5-0.125-0.9 MG/250ML-% EP SOLN
12.0000 mL/h | EPIDURAL | Status: DC | PRN
  Administered 2020-11-30: 12 mL/h via EPIDURAL
  Filled 2020-11-29: qty 250

## 2020-11-29 MED ORDER — LACTATED RINGERS IV SOLN: INTRAVENOUS | Status: DC

## 2020-11-29 MED ORDER — PHENYLEPHRINE 40 MCG/ML (10ML) SYRINGE FOR IV PUSH (FOR BLOOD PRESSURE SUPPORT): 80.0000 ug | PREFILLED_SYRINGE | INTRAVENOUS | Status: DC | PRN

## 2020-11-29 MED ORDER — SOD CITRATE-CITRIC ACID 500-334 MG/5ML PO SOLN: 30.0000 mL | ORAL | Status: DC | PRN

## 2020-11-29 MED ORDER — LACTATED RINGERS IV SOLN
500.0000 mL | INTRAVENOUS | Status: DC | PRN
  Administered 2020-11-30 (×2): 500 mL via INTRAVENOUS

## 2020-11-29 MED ORDER — OXYTOCIN-SODIUM CHLORIDE 30-0.9 UT/500ML-% IV SOLN
2.5000 [IU]/h | INTRAVENOUS | Status: DC
  Administered 2020-11-30: 2.5 [IU]/h via INTRAVENOUS

## 2020-11-29 MED ORDER — MISOPROSTOL 50MCG HALF TABLET
50.0000 ug | ORAL_TABLET | ORAL | Status: DC | PRN
  Administered 2020-11-29: 15:00:00 50 ug via BUCCAL
  Filled 2020-11-29: qty 1

## 2020-11-29 MED ORDER — MISOPROSTOL 25 MCG QUARTER TABLET
25.0000 ug | ORAL_TABLET | ORAL | Status: DC | PRN
  Administered 2020-11-29: 16:00:00 25 ug via VAGINAL
  Filled 2020-11-29: qty 1

## 2020-11-29 MED ORDER — PENICILLIN G POT IN DEXTROSE 60000 UNIT/ML IV SOLN
3.0000 10*6.[IU] | INTRAVENOUS | Status: DC
  Administered 2020-11-29 – 2020-11-30 (×6): 3 10*6.[IU] via INTRAVENOUS
  Filled 2020-11-29 (×5): qty 50

## 2020-11-29 MED ORDER — LIDOCAINE HCL (PF) 1 % IJ SOLN
30.0000 mL | INTRAMUSCULAR | Status: AC | PRN
  Administered 2020-11-30: 30 mL via SUBCUTANEOUS
  Filled 2020-11-29: qty 30

## 2020-11-29 MED ORDER — DIPHENHYDRAMINE HCL 50 MG/ML IJ SOLN: 12.5000 mg | INTRAMUSCULAR | Status: DC | PRN

## 2020-11-29 MED ORDER — OXYTOCIN-SODIUM CHLORIDE 30-0.9 UT/500ML-% IV SOLN
1.0000 m[IU]/min | INTRAVENOUS | Status: DC
Start: 2020-11-29 — End: 2020-11-30
  Administered 2020-11-29: 20:00:00 2 m[IU]/min via INTRAVENOUS
  Filled 2020-11-29: qty 500

## 2020-11-29 MED ORDER — TERBUTALINE SULFATE 1 MG/ML IJ SOLN: 0.2500 mg | Freq: Once | INTRAMUSCULAR | Status: DC | PRN

## 2020-11-29 MED ORDER — LACTATED RINGERS IV SOLN
500.0000 mL | Freq: Once | INTRAVENOUS | Status: AC
  Administered 2020-11-30: 500 mL via INTRAVENOUS

## 2020-11-29 MED ORDER — OXYTOCIN BOLUS FROM INFUSION
333.0000 mL | Freq: Once | INTRAVENOUS | Status: AC
  Administered 2020-11-30: 333 mL via INTRAVENOUS

## 2020-11-29 NOTE — H&P (Signed)
OBSTETRIC ADMISSION HISTORY AND PHYSICAL  Stacy Fowler is a 30 y.o. female G1P0 with IUP at 69w0dby LMP presenting for IOL for cHTN. She reports +FMs, no LOF, no VB, no blurry vision, headaches, peripheral edema, or RUQ pain.  She plans on breast feeding. She is undecided about what she wants for birth control postpartum.  [facility]   Dating: By LMP --->  Estimated Date of Delivery: 12/06/20  Sono:   '@[redacted]w[redacted]d' , normal anatomy, cephalic presentation,  anterior placenta, 3245g, 48% EFW  Prenatal History/Complications:  Hyperemesis cHTN (Labetalol 200 mg BID) Hyperthyroidism (was on Methimazole, no meds currently)  HSV (Valtrex); no recent outbreaks  Hx gallstone pancreatitis   Past Medical History: Past Medical History:  Diagnosis Date   Chronic hypertension    Gallstone pancreatitis    Genital herpes    Hyperemesis complicating pregnancy, antepartum    Infection    UTI   Obesity    Skin infection     Past Surgical History: Past Surgical History:  Procedure Laterality Date   BREAST REDUCTION SURGERY Bilateral 2009    Obstetrical History: OB History     Gravida  1   Para      Term      Preterm      AB      Living         SAB      IAB      Ectopic      Multiple      Live Births              Social History Social History   Socioeconomic History   Marital status: Single    Spouse name: Not on file   Number of children: Not on file   Years of education: Not on file   Highest education level: Not on file  Occupational History   Not on file  Tobacco Use   Smoking status: Never   Smokeless tobacco: Never  Vaping Use   Vaping Use: Never used  Substance and Sexual Activity   Alcohol use: Not Currently    Comment: occasionally   Drug use: No   Sexual activity: Yes    Birth control/protection: None  Other Topics Concern   Not on file  Social History Narrative   Not on file   Social Determinants of Health    Financial Resource Strain: Not on file  Food Insecurity: No Food Insecurity   Worried About Running Out of Food in the Last Year: Never true   Ran Out of Food in the Last Year: Never true  Transportation Needs: No Transportation Needs   Lack of Transportation (Medical): No   Lack of Transportation (Non-Medical): No  Physical Activity: Not on file  Stress: Not on file  Social Connections: Not on file    Family History: Family History  Problem Relation Age of Onset   Healthy Mother    Diabetes Other    Hypertension Other     Allergies: Allergies  Allergen Reactions   Other     Cantaloupe: itching    Medications Prior to Admission  Medication Sig Dispense Refill Last Dose   aspirin 81 MG chewable tablet Chew 1 tablet (81 mg total) by mouth daily. 30 tablet 6    Blood Pressure Monitoring (BLOOD PRESSURE KIT) DEVI 1 Device by Does not apply route as needed. ICD10 O09.90 may need larger cuff 1 each 0    docusate sodium (COLACE)  100 MG capsule Take 1 capsule (100 mg total) by mouth 2 (two) times daily. 60 capsule 0    doxylamine, Sleep, (UNISOM) 25 MG tablet Take 25 mg by mouth at bedtime as needed. (Patient not taking: No sig reported)      famotidine (PEPCID) 20 MG tablet Take 1 tablet (20 mg total) by mouth 2 (two) times daily. 60 tablet 0    ferrous sulfate 325 (65 FE) MG EC tablet Take 1 tablet (325 mg total) by mouth every other day. 30 tablet 2    labetalol (NORMODYNE) 200 MG tablet Take 1 tablet by mouth 2 (two) times daily. 60 tablet 2    methimazole (TAPAZOLE) 5 MG tablet Take 1 tablet (5 mg total) by mouth 3 (three) times daily. (Patient not taking: No sig reported) 90 tablet 0    nitrofurantoin, macrocrystal-monohydrate, (MACROBID) 100 MG capsule Take 1 capsule by mouth 2  times daily. (Patient not taking: Reported on 11/23/2020) 14 capsule 0    ondansetron (ZOFRAN-ODT) 4 MG disintegrating tablet Dissolve 1 tablet by mouth every 8 hours as needed (Patient not taking:  Reported on 11/23/2020) 24 tablet 0    Prenatal Vit-Fe Fumarate-FA (PRENATAL VITAMINS PO) Take 1 tablet by mouth daily.      pyridOXINE (VITAMIN B-6) 100 MG tablet Take 100 mg by mouth daily. (Patient not taking: Reported on 11/23/2020)      scopolamine (TRANSDERM-SCOP) 1 MG/3DAYS Place 1 patch onto the skin every 3 days as directed 10 patch 0    ursodiol (ACTIGALL) 300 MG capsule Take 1 capsule by mouth 3 times daily. (Patient not taking: No sig reported) 90 capsule 5    valACYclovir (VALTREX) 500 MG tablet Take 1 tablet by mouth 2 (two) times daily. 60 tablet 2      Review of Systems  All systems reviewed and negative except as stated in HPI  Blood pressure 127/83, pulse (!) 125, temperature 98.5 F (36.9 C), temperature source Oral, resp. rate 20, height '5\' 7"'  (1.702 m), weight 124.5 kg, last menstrual period 03/01/2020. General appearance: alert, cooperative, and no distress Lungs: normal work of breathing on room air  Heart: normal rate, warm and well perfused  Abdomen: soft, non-tender, gravid  Pelvic: normal external genitalia, normal vagina and cervix, no lesions noted  Extremities: no LE edema or calf tenderness to palpation  Presentation:  Cephalic Fetal monitoring: Baseline 150 bpm, moderate variability, + accels, no decels  Uterine activity: Intermittent contractions Dilation: 2.5 Effacement (%): 50 Station: -2 Presentation: Vertex Exam by:: Scarlette Slice RN   Prenatal labs: ABO, Rh: --/--/O POS (11/17 1420) Antibody: PENDING (11/17 1420) Rubella: 3.14 (06/15 1613) RPR: Non Reactive (08/12 0854)  HBsAg: Negative (06/15 1613)  HIV: Non Reactive (08/12 0854)  GBS: Positive/-- (10/27 1104)  GTT: Normal  Genetic screening - Not done  Anatomy US normal  Prenatal Transfer Tool  Maternal Diabetes: No Genetic Screening: Not done  Maternal Ultrasounds/Referrals: Normal Fetal Ultrasounds or other Referrals:  None Maternal Substance Abuse:  No Significant Maternal  Medications:  Labetalol 200 mg BID, Valtrex 500 mg BID, ASA Significant Maternal Lab Results: Group B Strep positive  Results for orders placed or performed during the hospital encounter of 11/29/20 (from the past 24 hour(s))  Resp Panel by RT-PCR (Flu A&B, Covid) Urine, Clean Catch   Collection Time: 11/29/20  1:51 PM   Specimen: Urine, Clean Catch; Nasopharyngeal(NP) swabs in vial transport medium  Result Value Ref Range   SARS Coronavirus 2 by RT PCR  NEGATIVE NEGATIVE   Influenza A by PCR NEGATIVE NEGATIVE   Influenza B by PCR NEGATIVE NEGATIVE  CBC   Collection Time: 11/29/20  2:20 PM  Result Value Ref Range   WBC 9.9 4.0 - 10.5 K/uL   RBC 4.14 3.87 - 5.11 MIL/uL   Hemoglobin 11.1 (L) 12.0 - 15.0 g/dL   HCT 35.2 (L) 36.0 - 46.0 %   MCV 85.0 80.0 - 100.0 fL   MCH 26.8 26.0 - 34.0 pg   MCHC 31.5 30.0 - 36.0 g/dL   RDW 14.3 11.5 - 15.5 %   Platelets 359 150 - 400 K/uL   nRBC 0.0 0.0 - 0.2 %  Comprehensive metabolic panel   Collection Time: 11/29/20  2:20 PM  Result Value Ref Range   Sodium 132 (L) 135 - 145 mmol/L   Potassium 4.3 3.5 - 5.1 mmol/L   Chloride 104 98 - 111 mmol/L   CO2 18 (L) 22 - 32 mmol/L   Glucose, Bld 76 70 - 99 mg/dL   BUN 9 6 - 20 mg/dL   Creatinine, Ser 0.66 0.44 - 1.00 mg/dL   Calcium 9.1 8.9 - 10.3 mg/dL   Total Protein 6.8 6.5 - 8.1 g/dL   Albumin 2.8 (L) 3.5 - 5.0 g/dL   AST 15 15 - 41 U/L   ALT 9 0 - 44 U/L   Alkaline Phosphatase 105 38 - 126 U/L   Total Bilirubin 0.3 0.3 - 1.2 mg/dL   GFR, Estimated >60 >60 mL/min   Anion gap 10 5 - 15  Type and screen   Collection Time: 11/29/20  2:20 PM  Result Value Ref Range   ABO/RH(D) O POS    Antibody Screen PENDING    Sample Expiration      12/02/2020,2359 Performed at [facility], [address] [place], [place] [address]     Patient Active Problem List   Diagnosis Date Noted   Chronic hypertension affecting pregnancy 11/29/2020   Anemia during pregnancy in second trimester  08/29/2020   Alpha thalassemia silent carrier 08/02/2020   Fibroid uterus 06/27/2020   Supervision of high risk pregnancy, antepartum 06/21/2020   Preexisting hypertension complicating pregnancy, antepartum 06/21/2020   Genital herpes    Hyperthyroidism 06/11/2020   Hypokalemia 06/11/2020   Hyponatremia 06/11/2020   Abnormal LFTs 06/11/2020   AKI (acute kidney injury) ([place]) 06/11/2020   Acute gallstone pancreatitis 06/10/2020   Chlamydial infection 04/23/2020   HPV in female 07/28/2016   HYPERTENSION, BENIGN ESSENTIAL 11/26/2009   HSV (herpes simplex virus) anogenital infection 04/26/2009   MORBID OBESITY 04/26/2009    Assessment/Plan:  Stacy Fowler is a 30 y.o. G1P0 at 16w0dhere for IOL due to [place]   #Labor: Foley balloon placed with vaginal Cytotec 25 mcg. Will reassess in 4 hours.  #Pain: PRN; IV Fentanyl for now. Planning for epidural.  #FWB: Cat 1  #ID:  GBS pos - PCN #MOF: Breast #MOC: Undecided   #cHTN: Mild range BP on admission. Baseline pre-E labs pending. No symptoms. Continue home Labetalol 200 mg BID.  #HSV: Negative SSE. Continue home Valtrex 500 mg BID.   #Hyperthyroidism: Previously on Methimazole. No longer taking. Last TSH low with normal T4.   #Hx of gallstone pancreatitis: Stable. Symptoms controlled. Will continue to monitor.    CGenia Del MD  11/29/2020, 3:58 PM

## 2020-11-29 NOTE — Progress Notes (Signed)
Cx 5.5/70/-2.  FHR Cat 1. Mild ctx.  Will start pitocin.

## 2020-11-29 NOTE — Progress Notes (Signed)
Upon assessment, scopolamine transdermal patch behind left ear. Pt states this is prescribed to her by OB for nausea/vomiting. States it has been in place for two days.

## 2020-11-30 ENCOUNTER — Inpatient Hospital Stay (HOSPITAL_COMMUNITY): Payer: Medicaid Other | Admitting: Anesthesiology

## 2020-11-30 ENCOUNTER — Encounter (HOSPITAL_COMMUNITY): Payer: Self-pay | Admitting: Family Medicine

## 2020-11-30 DIAGNOSIS — O99820 Streptococcus B carrier state complicating pregnancy: Secondary | ICD-10-CM

## 2020-11-30 DIAGNOSIS — O1002 Pre-existing essential hypertension complicating childbirth: Secondary | ICD-10-CM

## 2020-11-30 DIAGNOSIS — Z3A39 39 weeks gestation of pregnancy: Secondary | ICD-10-CM

## 2020-11-30 DIAGNOSIS — O99284 Endocrine, nutritional and metabolic diseases complicating childbirth: Secondary | ICD-10-CM

## 2020-11-30 LAB — CBC
HCT: 29.8 % — ABNORMAL LOW (ref 36.0–46.0)
Hemoglobin: 9.7 g/dL — ABNORMAL LOW (ref 12.0–15.0)
MCH: 27.5 pg (ref 26.0–34.0)
MCHC: 32.6 g/dL (ref 30.0–36.0)
MCV: 84.4 fL (ref 80.0–100.0)
Platelets: 262 10*3/uL (ref 150–400)
RBC: 3.53 MIL/uL — ABNORMAL LOW (ref 3.87–5.11)
RDW: 14.5 % (ref 11.5–15.5)
WBC: 13.3 10*3/uL — ABNORMAL HIGH (ref 4.0–10.5)
nRBC: 0 % (ref 0.0–0.2)

## 2020-11-30 LAB — RPR: RPR Ser Ql: NONREACTIVE

## 2020-11-30 MED ORDER — ONDANSETRON HCL 4 MG PO TABS: 4.0000 mg | ORAL_TABLET | ORAL | Status: DC | PRN

## 2020-11-30 MED ORDER — DIPHENHYDRAMINE HCL 25 MG PO CAPS: 25.0000 mg | ORAL_CAPSULE | Freq: Four times a day (QID) | ORAL | Status: DC | PRN

## 2020-11-30 MED ORDER — ACETAMINOPHEN 325 MG PO TABS: 650.0000 mg | ORAL_TABLET | ORAL | Status: DC | PRN

## 2020-11-30 MED ORDER — SIMETHICONE 80 MG PO CHEW: 80.0000 mg | CHEWABLE_TABLET | ORAL | Status: DC | PRN

## 2020-11-30 MED ORDER — MEASLES, MUMPS & RUBELLA VAC IJ SOLR: 0.5000 mL | Freq: Once | INTRAMUSCULAR | Status: DC

## 2020-11-30 MED ORDER — ONDANSETRON HCL 4 MG/2ML IJ SOLN: 4.0000 mg | INTRAMUSCULAR | Status: DC | PRN

## 2020-11-30 MED ORDER — DIBUCAINE (PERIANAL) 1 % EX OINT: 1.0000 "application " | TOPICAL_OINTMENT | CUTANEOUS | Status: DC | PRN

## 2020-11-30 MED ORDER — PRENATAL MULTIVITAMIN CH
1.0000 | ORAL_TABLET | Freq: Every day | ORAL | Status: DC
  Administered 2020-12-01 – 2020-12-02 (×2): 1 via ORAL
  Filled 2020-11-30 (×2): qty 1

## 2020-11-30 MED ORDER — LACTATED RINGERS IV SOLN: 500.0000 mL | Freq: Once | INTRAVENOUS | Status: DC

## 2020-11-30 MED ORDER — MAGNESIUM HYDROXIDE 400 MG/5ML PO SUSP: 30.0000 mL | ORAL | Status: DC | PRN

## 2020-11-30 MED ORDER — COCONUT OIL OIL: 1.0000 "application " | TOPICAL_OIL | Status: DC | PRN

## 2020-11-30 MED ORDER — BENZOCAINE-MENTHOL 20-0.5 % EX AERO
1.0000 "application " | INHALATION_SPRAY | CUTANEOUS | Status: DC | PRN
  Filled 2020-11-30: qty 56

## 2020-11-30 MED ORDER — TETANUS-DIPHTH-ACELL PERTUSSIS 5-2.5-18.5 LF-MCG/0.5 IM SUSY: 0.5000 mL | PREFILLED_SYRINGE | Freq: Once | INTRAMUSCULAR | Status: DC

## 2020-11-30 MED ORDER — IBUPROFEN 600 MG PO TABS
600.0000 mg | ORAL_TABLET | Freq: Four times a day (QID) | ORAL | Status: DC
  Administered 2020-11-30 – 2020-12-02 (×6): 600 mg via ORAL
  Filled 2020-11-30 (×9): qty 1

## 2020-11-30 MED ORDER — LIDOCAINE HCL (PF) 1 % IJ SOLN
INTRAMUSCULAR | Status: DC | PRN
  Administered 2020-11-30 (×2): 4 mL via EPIDURAL

## 2020-11-30 MED ORDER — WITCH HAZEL-GLYCERIN EX PADS: 1.0000 "application " | MEDICATED_PAD | CUTANEOUS | Status: DC | PRN

## 2020-11-30 MED ORDER — ACETAMINOPHEN 500 MG PO TABS
1000.0000 mg | ORAL_TABLET | Freq: Once | ORAL | Status: AC
  Administered 2020-11-30: 1000 mg via ORAL
  Filled 2020-11-30: qty 2

## 2020-11-30 MED ORDER — IBUPROFEN 600 MG PO TABS: 600.0000 mg | ORAL_TABLET | Freq: Once | ORAL | Status: DC

## 2020-11-30 NOTE — Anesthesia Procedure Notes (Signed)
Epidural Patient location during procedure: OB Start time: 11/30/2020 12:42 AM End time: 11/30/2020 12:46 AM  Staffing Anesthesiologist: Brennan Bailey, MD Performed: anesthesiologist   Preanesthetic Checklist Completed: patient identified, IV checked, risks and benefits discussed, monitors and equipment checked, pre-op evaluation and timeout performed  Epidural Patient position: sitting Prep: DuraPrep and site prepped and draped Patient monitoring: continuous pulse ox, blood pressure and heart rate Approach: midline Location: L3-L4 Injection technique: LOR air  Needle:  Needle type: Tuohy  Needle gauge: 17 G Needle length: 9 cm Needle insertion depth: 7 cm Catheter type: closed end flexible Catheter size: 19 Gauge Catheter at skin depth: 12 cm Test dose: negative and Other (1% lidocaine)  Assessment Events: blood not aspirated, injection not painful, no injection resistance, no paresthesia and negative IV test  Additional Notes Patient identified. Risks, benefits, and alternatives discussed with patient including but not limited to bleeding, infection, nerve damage, paralysis, failed block, incomplete pain control, headache, blood pressure changes, nausea, vomiting, reactions to medication, itching, and postpartum back pain. Confirmed with bedside nurse the patient's most recent platelet count. Confirmed with patient that they are not currently taking any anticoagulation, have any bleeding history, or any family history of bleeding disorders. Patient expressed understanding and wished to proceed. All questions were answered. Sterile technique was used throughout the entire procedure. Please see nursing notes for vital signs.   Crisp LOR on first pass. Test dose was given through epidural catheter and negative prior to continuing to dose epidural or start infusion. Warning signs of high block given to the patient including shortness of breath, tingling/numbness in hands,  complete motor block, or any concerning symptoms with instructions to call for help. Patient was given instructions on fall risk and not to get out of bed. All questions and concerns addressed with instructions to call with any issues or inadequate analgesia.  Reason for block:procedure for pain

## 2020-11-30 NOTE — Lactation Note (Addendum)
This note was copied from a baby's chart. Lactation Consultation Note  Patient Name: Stacy Fowler [id number] Date: 11/30/2020 Reason for consult: Follow-up assessment;1st time breastfeeding;Primapara;Term;Breast reduction Age:30 years  LC in to assist P89 Mom with breastfeeding.  Baby sleepy and getting immunizations by RN.  Placed baby STS on Mom's chest.  Mom reports breast reduction surgery 12 yrs ago.  Areola was resized and Mom feels that her nipple was removed and replaced.  Mom reports no breast changes with pregnancy.    Discussed options with Mom and GMOB.  Mom wants to try to breastfeed, but understands that the need to supplement is there.  Currently baby is sleeping and appears contented.  Reviewed feeding cues with Mom.    Reviewed breast massage and hand expression.  Unable to express any drops of colostrum.  Areola and nipple are stiff and difficult to compress.    RN stated that Mom had requested donor breast milk for supplementation. Mom signed donor breast milk consent. Order placed for donor milk for baby.  [place] set up DEBP and teaching done on importance of stimulating her milk producing hormones.  Mom states she has a DEBP at home, thinks it is a Medela.  Mom also has [facility], referral sent to [facility].  Plan- 1- Lots of STS, watching baby for feeding cues 2- Offer breast with cues, asking for help prn 3- Supplement baby with donor BM per volume guidelines. 4-Pump both breasts on initiation setting for 15 mins, adding hand expression and breast massage.  Encouraged Mom to ask for help prn.  Lactation Tools Discussed/Used Tools: Pump;Flanges Flange Size: 24 Breast pump type: Double-Electric Breast Pump Pump Education: Setup, frequency, and cleaning;Milk Storage Reason for Pumping: Support milk supply/breast reduction surgery Pumping frequency: Mom encouraged to pump 15 mins both breasts after feedings  Interventions Interventions: Breast feeding basics reviewed;Skin to  skin;Breast massage;Hand express;Support pillows;DEBP;Education;LC Services brochure  Discharge Pump: Personal (Medela DEBP) WIC Program: Yes  Consult Status Consult Status: Follow-up Date: 12/01/20 Follow-up type: In-patient    Broadus John 11/30/2020, 4:35 PM

## 2020-11-30 NOTE — Lactation Note (Addendum)
This note was copied from a baby's chart. Lactation Consultation Note  Patient Name: Girl Aleysia Oltmann [id number] Date: 11/30/2020 Reason for consult: L&D Initial assessment Age:31 hours P1, Mother reports that she has a breast reduction in 2009 at age 5. Mother reports that she was young and didn't discuss breastfeeding or type of procedure that was done. mother has not seen any colostrum. Mother reports that she wants to try to breastfeed.   Assist with hand expression. No observed drops of colostrum. Discussed possible need for supplement. Discussed need to pump and try to make as much milk as possible.  Discussed cue base feeding and frequent STS.  Maternal Data Has patient been taught Hand Expression?: Yes Does the patient have breastfeeding experience prior to this delivery?: No  Feeding Mother's Current Feeding Choice: Breast Milk  LATCH Score Latch: Repeated attempts needed to sustain latch, nipple held in mouth throughout feeding, stimulation needed to elicit sucking reflex.  Audible Swallowing: None  Type of Nipple: Flat  Comfort (Breast/Nipple): Soft / non-tender  Hold (Positioning): Full assist, staff holds infant at breast  LATCH Score: 4   Lactation Tools Discussed/Used    Interventions Interventions: Breast feeding basics reviewed;Assisted with latch;Skin to skin;Hand express;Adjust position;Support pillows;Position options  Discharge    Consult Status Consult Status: Follow-up from L&D    Jess Barters [facility] 11/30/2020, 3:05 PM

## 2020-11-30 NOTE — Progress Notes (Signed)
Vitals:   11/30/20 0230 11/30/20 0300  BP: 126/79 120/79  Pulse: 87 86  Resp:    Temp:    SpO2:     Comfortable w/epidural. SROM w/clear fluid.  Had 2 decels that were either early or late, no ctx on EFM to evaluate with.  Cx 6/60/-2.  + Scalp stim. IUPC placed d/t difficulty picking up ctx.  Pitocin at 10 mu/min.  1 early decel w/ctx, otherwise no more decels.  Continue present mtg, labor appears adequate.

## 2020-11-30 NOTE — Progress Notes (Signed)
Patient Vitals for the past 4 hrs:  BP Temp Temp src Pulse SpO2  11/30/20 0125 116/77 -- -- 90 --  11/30/20 0120 (!) 119/54 -- -- 85 --  11/30/20 0115 125/61 -- -- 88 --  11/30/20 0110 120/70 -- -- 94 --  11/30/20 0105 120/69 -- -- 90 --  11/30/20 0100 127/67 98.9 F (37.2 C) Oral 84 --  11/30/20 0055 (!) 114/56 -- -- 92 --  11/30/20 0050 125/88 -- -- 98 --  11/30/20 0045 -- -- -- -- 100 %  11/30/20 0040 -- -- -- -- 100 %  11/30/20 0031 (!) 106/48 -- -- 85 --  11/30/20 0001 135/76 -- -- 86 --  11/29/20 2331 106/63 -- -- 89 --  11/29/20 2301 (!) 106/45 -- -- 86 --  11/29/20 2230 117/73 -- -- 89 --  11/29/20 2130 98/61 -- -- (!) 109 --   Comfortable w/epidural. FHR Cat 1.  Ctx q 1.5-2 minutes. Pitocin at 8 mu/min.  Continue present mgt.

## 2020-11-30 NOTE — Progress Notes (Signed)
Patient Vitals for the past 4 hrs:  BP Temp Temp src Pulse  11/30/20 0620 (!) 111/55 -- -- 80  11/30/20 0600 (!) 94/48 -- -- 81  11/30/20 0530 (!) 103/56 -- -- 75  11/30/20 0500 (!) 106/57 -- -- 79  11/30/20 0459 -- 98.6 F (37 C) Oral --  11/30/20 0430 128/79 -- -- 81  11/30/20 0400 115/76 -- -- 86  11/30/20 0330 (!) 97/49 -- -- 73  11/30/20 0300 120/79 -- -- 86   MVUs haven't quite been adequate yet  Turned Pitocin off for about an hour s/p late decels.  Variability moderate throughout, now having plenty of accels.  Will continue to titrate pitocin up until labor adequate.

## 2020-11-30 NOTE — Progress Notes (Signed)
Patient ID: Stacy Fowler, female   DOB: 09-20-1990, 30 y.o.   MRN: [id number]  Notified by Vito Berger, RN of new onset oral temp 100.5. Noted on arrival to M/B unit. Patient otherwise asymptomatic.  Discussed with Dr. Ernestina Patches. Will give Tylenol now and continue to monitor.  Mallie Snooks, [place], MSN, CNM Certified Nurse Midwife, Product/process development scientist for [employer], [place]

## 2020-11-30 NOTE — Progress Notes (Signed)
Stacy Fowler is a 30 y.o. G1P0 at [redacted]w[redacted]d   Subjective:   Objective: BP 123/76   Pulse 92   Temp 98.7 F (37.1 C) (Oral)   Resp 17   Ht 5\' 7"  (1.702 m)   Wt 124.5 kg   LMP 03/01/2020   SpO2 100%   BMI 42.99 kg/m  No intake/output data recorded. Total I/O In: -  Out: 500 [Urine:500]  FHT:  FHR: 150 bpm, variability: moderate,  accelerations:  Present,  decelerations:  Absent UC:   regular, every 2-4 minutes SVE:   Dilation: 8 Effacement (%): 90 Station: -1 Exam by:: [employer] CNM  Labs: Lab Results  Component Value Date   WBC 12.2 (H) 11/29/2020   HGB 9.9 (L) 11/29/2020   HCT 30.7 (L) 11/29/2020   MCV 84.1 11/29/2020   PLT 275 11/29/2020    Assessment / Plan: --CHTN, Labetalol held, no severe range --Decels resolving with repositioning and IV fluid bolus --Afebrile, reassuring fetal surveillance --Pitocin infusing at 6 milliunits --Anticipate NSVD  Darlina Rumpf, CNM 11/30/2020, 9:10 AM

## 2020-11-30 NOTE — Discharge Instructions (Signed)

## 2020-11-30 NOTE — Discharge Summary (Signed)
Postpartum Discharge Summary      Patient Name: Stacy Fowler DOB: January 10, 1991 MRN: [id number]  Date of admission: 11/29/2020 Delivery date:11/30/2020  Delivering provider: Darlina Rumpf  Date of discharge: 12/02/2020  Admitting diagnosis: Chronic hypertension affecting pregnancy [O10.919] Intrauterine pregnancy: [redacted]w[redacted]d    Secondary diagnosis:  Principal Problem:   Chronic hypertension affecting pregnancy  Additional problems: none    Discharge diagnosis: Term Pregnancy Delivered and CHTN                                              Post partum procedures: N/A Augmentation: Pitocin, Cytotec, and IP Foley Complications: None  Hospital course: Induction of Labor With Vaginal Delivery   30y.o. yo G1P0 at 31w1das admitted to the hospital 11/29/2020 for induction of labor.  Indication for induction:  CHTN .  Patient had an uncomplicated labor course as follows: Membrane Rupture Time/Date: 3:33 AM ,11/30/2020   Delivery Method:Vaginal, Spontaneous  Episiotomy: None  Lacerations:  Periurethral  Details of delivery can be found in separate delivery note.  Patient had a routine postpartum course. Patient is discharged home 12/02/20.  Newborn Data: Birth date:11/30/2020  Birth time:1:08 PM  Gender:Female  Living status:Living  Apgars:8 ,9  Weight:3334 g   Magnesium Sulfate received: No BMZ received: No Rhophylac:N/A MMR:N/A T-DaP:Given prenatally Flu: Yes Transfusion:No  Physical exam  Vitals:   11/30/20 2015 11/30/20 2307 12/01/20 0300 12/01/20 2255  BP: (!) 96/59 (!) 106/50 110/62 126/61  Pulse: (!) 105 88 86 85  Resp: _0 Temp: 98.4 F (36.9 C)  98.5 F (36.9 C) 98.3 F (36.8 C)  TempSrc: Oral  Oral Oral  SpO2: 99%   100%  Weight:      Height:       General: alert, cooperative, and no distress Lochia: appropriate Uterine Fundus: firm DVT Evaluation: No evidence of DVT seen on physical exam. Labs: Lab Results  Component Value Date   WBC  13.3 (H) 11/30/2020   HGB 9.7 (L) 11/30/2020   HCT 29.8 (L) 11/30/2020   MCV 84.4 11/30/2020   PLT 262 11/30/2020   CMP Latest Ref Rng & Units 11/29/2020  Glucose 70 - 99 mg/dL 76  BUN 6 - 20 mg/dL 9  Creatinine 0.44 - 1.00 mg/dL 0.66  Sodium 135 - 145 mmol/L 132(L)  Potassium 3.5 - 5.1 mmol/L 4.3  Chloride 98 - 111 mmol/L 104  CO2 22 - 32 mmol/L 18(L)  Calcium 8.9 - 10.3 mg/dL 9.1  Total Protein 6.5 - 8.1 g/dL 6.8  Total Bilirubin 0.3 - 1.2 mg/dL 0.3  Alkaline Phos 38 - 126 U/L 105  AST 15 - 41 U/L 15  ALT 0 - 44 U/L 9   Edinburgh Score: Edinburgh Postnatal Depression Scale Screening Tool 11/30/2020  I have been able to laugh and see the funny side of things. 0  I have looked forward with enjoyment to things. 0  I have blamed myself unnecessarily when things went wrong. 1  I have been anxious or worried for no good reason. 0  I have felt scared or panicky for no good reason. 0  Things have been getting on top of me. 0  I have been so unhappy that I have had difficulty sleeping. 0  I have felt sad or miserable. 0  I have been so unhappy that  I have been crying. 0  The thought of harming myself has occurred to me. 0  Edinburgh Postnatal Depression Scale Total 1     After visit meds:  Allergies as of 12/02/2020       Reactions   Other    Cantaloupe: itching        Medication List     STOP taking these medications    doxylamine (Sleep) 25 MG tablet Commonly known as: UNISOM   labetalol 200 MG tablet Commonly known as: NORMODYNE   methimazole 5 MG tablet Commonly known as: TAPAZOLE   nitrofurantoin (macrocrystal-monohydrate) 100 MG capsule Commonly known as: MACROBID   ondansetron 4 MG disintegrating tablet Commonly known as: ZOFRAN-ODT   pyridOXINE 100 MG tablet Commonly known as: VITAMIN B-6   ursodiol 300 MG capsule Commonly known as: ACTIGALL       TAKE these medications    aspirin 81 MG chewable tablet Chew 1 tablet (81 mg total) by  mouth daily.   Blood Pressure Kit Devi 1 Device by Does not apply route as needed. ICD10 O09.90 may need larger cuff   docusate sodium 100 MG capsule Commonly known as: COLACE Take 1 capsule (100 mg total) by mouth 2 (two) times daily.   famotidine 20 MG tablet Commonly known as: PEPCID Take 1 tablet (20 mg total) by mouth 2 (two) times daily.   ferrous sulfate 325 (65 FE) MG EC tablet Take 1 tablet (325 mg total) by mouth every other day.   furosemide 20 MG tablet Commonly known as: Lasix Take 1 tablet (20 mg total) by mouth 2 (two) times daily for 5 days.   ibuprofen 600 MG tablet Commonly known as: ADVIL Take 1 tablet (600 mg total) by mouth every 6 (six) hours.   PRENATAL VITAMINS PO Take 1 tablet by mouth daily.   Transderm-Scop 1 MG/3DAYS Generic drug: scopolamine Place 1 patch onto the skin every 3 days as directed   valACYclovir 500 MG tablet Commonly known as: Valtrex Take 1 tablet by mouth 2 (two) times daily.         Discharge home in stable condition Infant Feeding: Breast Infant Disposition:home with mother Discharge instruction: per After Visit Summary and Postpartum booklet. Activity: Advance as tolerated. Pelvic rest for 6 weeks.  Diet: routine diet Future Appointments:No future appointments. Follow up Visit:  [place] [facility] Follow up.   Specialty: Obstetrics and Gynecology Why: pp check, blood pressure check Contact information: [place] [id number] [phone]               Postpartum message sent by Maryelizabeth Kaufmann, CNM on 11/30/2020  Please schedule this patient for a In person postpartum visit in 4 weeks with the following provider: Any provider. Additional Postpartum F/U:BP check 1 week  High risk pregnancy complicated by: HTN Delivery mode:  Vaginal, Spontaneous  Anticipated Birth Control:  Unsure   12/02/2020 Donnamae Jude, MD

## 2020-11-30 NOTE — Anesthesia Preprocedure Evaluation (Signed)
Anesthesia Evaluation  Patient identified by MRN, date of birth, ID band Patient awake    Reviewed: Allergy & Precautions, Patient's Chart, lab work & pertinent test results, reviewed documented beta blocker date and time   History of Anesthesia Complications Negative for: history of anesthetic complications  Airway Mallampati: II  TM Distance: >3 FB Neck ROM: Full    Dental no notable dental hx.    Pulmonary neg pulmonary ROS,    Pulmonary exam normal        Cardiovascular hypertension, Pt. on medications and Pt. on home beta blockers Normal cardiovascular exam     Neuro/Psych negative neurological ROS  negative psych ROS   GI/Hepatic negative GI ROS, Neg liver ROS,   Endo/Other  Hyperthyroidism   Renal/GU negative Renal ROS  negative genitourinary   Musculoskeletal negative musculoskeletal ROS (+)   Abdominal   Peds  Hematology  (+) anemia , Hgb 9.9   Anesthesia Other Findings Day of surgery medications reviewed with patient.  Reproductive/Obstetrics negative OB ROS                             Anesthesia Physical Anesthesia Plan  ASA: 2  Anesthesia Plan: Epidural   Post-op Pain Management:    Induction:   PONV Risk Score and Plan: Treatment may vary due to age or medical condition  Airway Management Planned: Natural Airway  Additional Equipment:   Intra-op Plan:   Post-operative Plan:   Informed Consent: I have reviewed the patients History and Physical, chart, labs and discussed the procedure including the risks, benefits and alternatives for the proposed anesthesia with the patient or authorized representative who has indicated his/her understanding and acceptance.       Plan Discussed with:   Anesthesia Plan Comments:         Anesthesia Quick Evaluation

## 2020-12-01 ENCOUNTER — Other Ambulatory Visit (HOSPITAL_COMMUNITY): Payer: Self-pay

## 2020-12-01 MED ORDER — FERROUS SULFATE 325 (65 FE) MG PO TABS
325.0000 mg | ORAL_TABLET | ORAL | Status: DC
  Administered 2020-12-01: 325 mg via ORAL
  Filled 2020-12-01: qty 1

## 2020-12-01 NOTE — Lactation Note (Signed)
This note was copied from a baby's chart. Lactation Consultation Note  Patient Name: Stacy Fowler [id number] Date: 12/01/2020 Reason for consult: Follow-up assessment;Term;Primapara;1st time breastfeeding Age:31 hours   LC Follow Up Consult:  RN requested latch assistance.  Mother had a breast reduction 12 years ago.  Baby was swaddled and asleep in father's arms when I arrived.  Mother had recently fed formula (18 mls) 30 minutes ago.  Educated mother on breast feeding prior to giving formula supplementation.  Since baby is not showing feeding cues and fed a large amount of formula, I suggested mother call me back for the next feeding.  Mother has been using the DEBP; reviewed breast feeding basics and pumping.  Changed the flange size from a #24 to a #21 for better fit.  Mother denied pain with pumping.  Formula supplementation guidelines at bedside; reviewed with parents.     Maternal Data    Feeding Mother's Current Feeding Choice: Breast Milk and Formula Nipple Type: Slow - flow  LATCH Score                    Lactation Tools Discussed/Used    Interventions Interventions: Education  Discharge Pump: DEBP;Manual;Personal WIC Program: Yes  Consult Status Consult Status: Follow-up Date: 12/02/20 Follow-up type: In-patient    Stacy Fowler [name] 12/01/2020, 4:49 AM

## 2020-12-01 NOTE — Lactation Note (Signed)
This note was copied from a baby's chart. Lactation Consultation Note  Patient Name: Stacy Fowler [id number] Date: 12/01/2020 Reason for consult: Follow-up assessment;Term;Breast reduction;Primapara;1st time breastfeeding Age:30 years  LC in to room for follow up. Mother states she is formula feeding because she has not breast milk yet. Mother reports pumping only twice since DEBP was set up. Baby is in basinet actively sucking at pacifier. Mother explains baby took ~13 mL of formula at 68. Reviewed appropriate feeding volume per age, pace bottlefeeding, upright position and frequent burping. LC demonstrated pacing and baby transferred ~20 mL of formula.  LC reinforced pumping for stimulation. Mother and support person shows appreciation for information.  Feeding plan:  1-Feeding on demand or 8-12 times in 24h period. 2-Pump for stimulation after feedings 3-Supplement following guidelines, paced bottle feeding and fullness cues.  4-Encouraged maternal rest, hydration and food intake.   Contact LC as needed for feeds/support/concerns/questions.   Maternal Data Has patient been taught Hand Expression?: Yes  Feeding Mother's Current Feeding Choice: Breast Milk and Formula Nipple Type: Slow - flow  Lactation Tools Discussed/Used Tools: Pump Breast pump type: Double-Electric Breast Pump Reason for Pumping: stimulation and supplementation Pumping frequency: encouraged to pump 8-12 times in 24h  Interventions Interventions: DEBP;Expressed milk;Breast feeding basics reviewed;Education;Pace feeding  Consult Status Consult Status: Follow-up Date: 12/02/20 Follow-up type: In-patient    Stacy Fowler 12/01/2020, 8:18 PM

## 2020-12-01 NOTE — Progress Notes (Signed)
Post Partum Day 1 Subjective: no complaints, up ad lib, voiding, tolerating PO, and + flatus  Objective: Blood pressure 110/62, pulse 86, temperature 98.5 F (36.9 C), temperature source Oral, resp. rate 19, height 5\' 7"  (1.702 m), weight 124.5 kg, last menstrual period 03/01/2020, SpO2 99 %, unknown if currently breastfeeding.  Patient Vitals for the past 24 hrs:  BP Temp Temp src Pulse Resp SpO2  12/01/20 0300 110/62 98.5 F (36.9 C) Oral 86 19 --  11/30/20 2307 (!) 106/50 -- -- 88 -- --  11/30/20 2015 (!) 96/59 98.4 F (36.9 C) Oral (!) 105 19 99 %  11/30/20 1643 129/81 98.7 F (37.1 C) Oral (!) 122 18 100 %  11/30/20 1539 122/68 (!) 100.5 F (38.1 C) Oral 98 -- --  11/30/20 1508 94/77 98.6 F (37 C) Oral -- 16 --  11/30/20 1445 120/72 -- -- (!) 107 -- --  11/30/20 1430 105/67 -- -- (!) 108 -- --  11/30/20 1415 104/80 -- -- (!) 101 -- --  11/30/20 1401 105/63 -- -- (!) 111 -- --  11/30/20 1347 (!) 112/92 -- -- -- -- --  11/30/20 1320 106/66 -- -- -- -- --  11/30/20 1231 127/81 -- -- (!) 111 -- --  11/30/20 1200 122/71 -- -- 79 18 --  11/30/20 1131 126/65 -- -- 81 -- --  11/30/20 1107 -- 99.5 F (37.5 C) Axillary -- -- --  11/30/20 1105 -- 99 F (37.2 C) Oral -- -- --  11/30/20 1101 122/71 -- -- 79 -- --  11/30/20 1031 (!) 106/56 -- -- 84 -- --  11/30/20 1001 (!) 106/58 -- -- 79 -- --  11/30/20 0956 -- 99 F (37.2 C) Oral -- -- --   Physical Exam:  General: alert, cooperative, and no distress Lochia: appropriate Uterine Fundus: firm Incision: n/a DVT Evaluation: No evidence of DVT seen on physical exam.  Recent Labs    11/29/20 2345 11/30/20 1436  HGB 9.9* 9.7*  HCT 30.7* 29.8*    Assessment/Plan: -Patient doing well without complaint.  -Hbg stable. Will start PO iron supplement -Blood pressures within normal limits, currently holding labetalol. Will continue to watch and discussed with patient will have low threshold to start antihypertensives -One  elevated temp postpartum, resolved with tylenol and no further episodes. Likely physiologic changes due to childbirth vs. infection   LOS: 2 days   [place] 12/01/2020, 9:46 AM

## 2020-12-01 NOTE — Anesthesia Postprocedure Evaluation (Signed)
Anesthesia Post Note  Patient: Stacy Fowler  Procedure(s) Performed: AN AD HOC LABOR EPIDURAL     Patient location during evaluation: Mother Baby Anesthesia Type: Epidural Level of consciousness: awake Pain management: satisfactory to patient Vital Signs Assessment: post-procedure vital signs reviewed and stable Respiratory status: spontaneous breathing Cardiovascular status: stable Anesthetic complications: no   No notable events documented.  Last Vitals:  Vitals:   11/30/20 2307 12/01/20 0300  BP: (!) 106/50 110/62  Pulse: 88 86  Resp:  19  Temp:  36.9 C  SpO2:      Last Pain:  Vitals:   12/01/20 0300  TempSrc: Oral  PainSc: 0-No pain   Pain Goal:                   [employer]

## 2020-12-02 MED ORDER — FUROSEMIDE 20 MG PO TABS
20.0000 mg | ORAL_TABLET | Freq: Two times a day (BID) | ORAL | 0 refills | Status: DC
  Filled 2020-12-02: qty 10, 5d supply, fill #0

## 2020-12-02 MED ORDER — IBUPROFEN 600 MG PO TABS
600.0000 mg | ORAL_TABLET | Freq: Four times a day (QID) | ORAL | 0 refills | Status: DC
  Filled 2020-12-02: qty 30, 8d supply, fill #0

## 2020-12-02 NOTE — Lactation Note (Addendum)
This note was copied from a baby's chart. Lactation Consultation Note  Patient Name: Stacy Fowler [id number] Date: 12/02/2020 Reason for consult: Follow-up assessment;MD order (NP request) Age:31 hours  P1, Hx of breast reduction.  Mother states her plan is to pump and bottle feed and supplement with formula.  She states she has DEBP at home and plans to pump more at home. Provided family with volume guidelines for formula fed infant with volume of 15-30 ml currently.  Discussed paced bottle feeding.   Encouraged mother to pump q 3 hours.  Reviewed engorgement care and monitoring voids/stools. Provided family with extra slow flow nipple.   Feeding Mother's Current Feeding Choice: Breast Milk and Formula Nipple Type: Slow - flow   Interventions Interventions: DEBP;Education;Pace feeding  Discharge Discharge Education: Engorgement and breast care;Warning signs for feeding baby Pump: Personal;DEBP  Consult Status Consult Status: Complete Date: 12/02/20    Stacy Fowler [facility] 12/02/2020, 10:10 AM

## 2020-12-03 ENCOUNTER — Other Ambulatory Visit (HOSPITAL_COMMUNITY): Payer: Self-pay

## 2020-12-03 LAB — TYPE AND SCREEN
ABO/RH(D): O POS
Antibody Screen: POSITIVE
Unit division: 0
Unit division: 0

## 2020-12-03 LAB — BPAM RBC
Blood Product Expiration Date: 202212072359
Blood Product Expiration Date: 202212142359
ISSUE DATE / TIME: 202211171739
ISSUE DATE / TIME: 202211171739
Unit Type and Rh: 5100
Unit Type and Rh: 5100

## 2020-12-04 ENCOUNTER — Encounter: Payer: Self-pay | Admitting: Family Medicine

## 2020-12-04 DIAGNOSIS — O361930 Maternal care for other isoimmunization, third trimester, not applicable or unspecified: Secondary | ICD-10-CM | POA: Insufficient documentation

## 2020-12-05 ENCOUNTER — Ambulatory Visit (INDEPENDENT_AMBULATORY_CARE_PROVIDER_SITE_OTHER): Payer: Medicaid Other | Admitting: Family Medicine

## 2020-12-05 ENCOUNTER — Other Ambulatory Visit (HOSPITAL_COMMUNITY): Payer: Self-pay

## 2020-12-05 ENCOUNTER — Other Ambulatory Visit: Payer: Self-pay

## 2020-12-05 VITALS — BP 152/97 | HR 116 | Ht 67.0 in | Wt 272.0 lb

## 2020-12-05 DIAGNOSIS — O9279 Other disorders of lactation: Secondary | ICD-10-CM

## 2020-12-05 DIAGNOSIS — O99012 Anemia complicating pregnancy, second trimester: Secondary | ICD-10-CM

## 2020-12-05 DIAGNOSIS — Z9889 Other specified postprocedural states: Secondary | ICD-10-CM

## 2020-12-05 DIAGNOSIS — O0990 Supervision of high risk pregnancy, unspecified, unspecified trimester: Secondary | ICD-10-CM

## 2020-12-05 DIAGNOSIS — O10919 Unspecified pre-existing hypertension complicating pregnancy, unspecified trimester: Secondary | ICD-10-CM

## 2020-12-05 MED ORDER — HYDROCHLOROTHIAZIDE 25 MG PO TABS
25.0000 mg | ORAL_TABLET | Freq: Every day | ORAL | 3 refills | Status: DC
  Filled 2020-12-05: qty 30, 30d supply, fill #0

## 2020-12-05 NOTE — Patient Instructions (Signed)
For your blood pressure  Continue to take your labetalol until you finish the lasix (furosemide) After you take the Lasix, stop the labetalol and start the hydrochlorothiazide 25mg  daily

## 2020-12-05 NOTE — Progress Notes (Signed)
   Parent+Baby VISIT ENCOUNTER NOTE  Subjective:   Stacy Fowler is a 30 y.o. G71P1001 female here for a problem visit.  Pregnancy was complicated by chronic HTN, obesity .  Patient had induced vaginal on 11/30/20  Since delivery patient reports she has been feeling good.  Infant feeding: Patient is not providing breastmilk. They are currently providing breastmilk Other: exclusively formula feeding .  Total amount of formula in past 24 hrs is about > 10 ounces-- given 2 oz every 2-3 hours.   Current complaints: passed a clot today.   Denies abnormal vaginal bleeding, discharge, pelvic pain, problems with intercourse or other gynecologic concerns.    Gynecologic History No LMP recorded (lmp unknown).  Contraception: none and discussed and patient is not interested in a method.   Health Maintenance Due  Topic Date Due   Pneumococcal Vaccine 29-48 Years old (1 - PCV) Never done   PAP SMEAR-Modifier  10/11/2017   COVID-19 Vaccine (3 - Booster for Pfizer series) 11/15/2019    The following portions of the patient's history were reviewed and updated as appropriate: allergies, current medications, past family history, past medical history, past social history, past surgical history and problem list.  Review of Systems Pertinent items are noted in HPI.   Objective:  BP (!) 152/97   Pulse (!) 116   Ht 5\' 7"  (1.702 m)   Wt 272 lb (123.4 kg)   LMP  (LMP Unknown)   Breastfeeding No   BMI 42.60 kg/m  Gen: well appearing, NAD HEENT: no scleral icterus CV: RR Lung: Normal WOB Ext: warm well perfused  Breast: not indicated GU: not indicated   Assessment and Plan:  1. Chronic hypertension affecting pregnancy BP is elevated today, did not take labetalol Plan to continue labetalol through lasix course which she will get today After lasix completed plan to stop labetalol and transition to HCTZ  2. Anemia during pregnancy in second trimester Check hgb at 4-6 wk pp  3. MORBID  OBESITY  4. History of bilateral breast reduction surgery Stopped pumping and breastfeeding since discharge  5. Lactation symptom See above   Please refer to After Visit Summary for other counseling recommendations.   Return in about 1 week (around 12/12/2020) for [facility], blood pressure check.  Caren Macadam, MD, MPH, ABFM Attending [place] for [facility]

## 2020-12-06 ENCOUNTER — Encounter: Payer: Self-pay | Admitting: Family Medicine

## 2020-12-11 ENCOUNTER — Telehealth (HOSPITAL_COMMUNITY): Payer: Self-pay | Admitting: *Deleted

## 2020-12-11 DIAGNOSIS — Z1331 Encounter for screening for depression: Secondary | ICD-10-CM

## 2020-12-11 NOTE — Telephone Encounter (Signed)
Patient voiced no questions or concerns regarding her own health. Then patient began to cry. Stated, "I'm OK." RN asked patient if she would like to talk about whatever was bothering her. Patient declined. EPDS = 14. Last EPDS score on 11/23 = 2. RN asked if something specific had changed in the last week or if she was feeling overwhelmed by baby. Patient stated, "It's not the baby. It's just other stuff going on." Patient does not have a mental health appointment scheduled. RN informed patient that she will place a referral to [facility] for follow-up. Patient agreed and also requested RN email information on other maternal mental health resources.  Patient voiced no questions or concerns regarding baby at this time. Patient reported that infant sleeps in the bed with her. RN reviewed ABCs of safe sleep with patient. Patient verbalized understanding. Patient requested RN also email information on hospital's virtual Baby and Me class and on the virtual breastfeeding and pumping support groups. Email sent. Erline Levine, RN, 12/11/20,1913.

## 2020-12-12 ENCOUNTER — Other Ambulatory Visit: Payer: Self-pay

## 2020-12-12 ENCOUNTER — Ambulatory Visit (INDEPENDENT_AMBULATORY_CARE_PROVIDER_SITE_OTHER): Payer: Medicaid Other | Admitting: Family Medicine

## 2020-12-12 VITALS — BP 132/99 | HR 101 | Wt 272.0 lb

## 2020-12-12 DIAGNOSIS — O10919 Unspecified pre-existing hypertension complicating pregnancy, unspecified trimester: Secondary | ICD-10-CM

## 2020-12-15 ENCOUNTER — Encounter: Payer: Self-pay | Admitting: Radiology

## 2020-12-17 ENCOUNTER — Encounter: Payer: Self-pay | Admitting: Family Medicine

## 2020-12-19 ENCOUNTER — Encounter: Payer: Self-pay | Admitting: Family Medicine

## 2020-12-19 NOTE — Progress Notes (Signed)
   GYNECOLOGY PROBLEM  VISIT ENCOUNTER NOTE  Subjective:   Stacy Fowler is a 30 y.o. G19P1001 female here for a problem visit.  Pregnancy was complicated by chronic HTN.  Patient had spontaneous vaginal on 11/18  BP was elevated at last visit and we changed her medications. She had not started Lasix yet and I recommended this and then we added HCTZ. She had since completed the lasix and started the HCTZ daily.   Since delivery patient reports she has been feeling good. Reports family stress but enjoying motherhood.  Infant feeding: Patient is not providing breastmilk.   Denies abnormal vaginal bleeding, discharge, pelvic pain, problems with intercourse or other gynecologic concerns.    Gynecologic History No LMP recorded (lmp unknown).  Contraception: none  Health Maintenance Due  Topic Date Due  . Pneumococcal Vaccine 43-25 Years old (1 - PCV) Never done  . PAP SMEAR-Modifier  10/11/2017  . COVID-19 Vaccine (3 - Booster for Pfizer series) 11/15/2019    The following portions of the patient's history were reviewed and updated as appropriate: allergies, current medications, past family history, past medical history, past social history, past surgical history and problem list.  Review of Systems Pertinent items are noted in HPI.   Objective:  BP (!) 132/99   Pulse (!) 101   Wt 272 lb (123.4 kg)   LMP  (LMP Unknown)   Breastfeeding No   BMI 42.60 kg/m  Gen: well appearing, NAD. Engaged with infant. Holding her throughout visit.  HEENT: no scleral icterus CV: RR Lung: Normal WOB Ext: warm well perfused  Breast: not indicated GU: not indicated   Assessment and Plan:   1. Chronic hypertension affecting pregnancy Improved Repeat a PP visit and adjust medication at that time  Please refer to After Visit Summary for other counseling recommendations.   No follow-ups on file.  Caren Macadam, MD, MPH, ABFM Attending Lead for  [facility]

## 2020-12-21 NOTE — Telephone Encounter (Signed)
Yes, it's been faxed

## 2020-12-25 ENCOUNTER — Encounter: Payer: Self-pay | Admitting: Radiology

## 2020-12-25 NOTE — BH Specialist Note (Signed)
Pt did not arrive to video visit and did not answer the phone; Left HIPPA-compliant message to call back Jebediah Macrae from [facility] at  [phone] (Azelia Reiger's office).  ?; left MyChart message for patient.  ? ?

## 2020-12-31 NOTE — Progress Notes (Signed)
Post Partum Visit Note  Stacy Fowler is a 30 y.o. G43P1001 female who presents for a postpartum visit. She is 4 weeks postpartum following a normal spontaneous vaginal delivery.  I have fully reviewed the prenatal and intrapartum course. The delivery was at 39.1 gestational weeks.  Anesthesia: epidural. Postpartum course has been uneventful. Baby is doing well. Baby is feeding by breast. Bleeding no bleeding. Bowel function is normal. Bladder function is normal. Patient is sexually active. Contraception method is none. Postpartum depression screening: negative.   The pregnancy intention screening data noted above was reviewed. Potential methods of contraception were discussed. The patient elected to proceed with No data recorded.   Edinburgh Postnatal Depression Scale - 01/01/21 1335       Edinburgh Postnatal Depression Scale:  In the Past 7 Days   I have been able to laugh and see the funny side of things. 0    I have looked forward with enjoyment to things. 0    I have blamed myself unnecessarily when things went wrong. 0    I have been anxious or worried for no good reason. 0    I have felt scared or panicky for no good reason. 0    Things have been getting on top of me. 1    I have been so unhappy that I have had difficulty sleeping. 0    I have felt sad or miserable. 0    I have been so unhappy that I have been crying. 0    The thought of harming myself has occurred to me. 0    Edinburgh Postnatal Depression Scale Total 1             Health Maintenance Due  Topic Date Due   Pneumococcal Vaccine 32-46 Years old (1 - PCV) Never done   PAP SMEAR-Modifier  10/11/2017   COVID-19 Vaccine (3 - Booster for Pfizer series) 11/15/2019    The following portions of the patient's history were reviewed and updated as appropriate: allergies, current medications, past family history, past medical history, past social history, past surgical history, and problem list.  Review of  Systems Pertinent items noted in HPI and remainder of comprehensive ROS otherwise negative.  Objective:  BP 127/84    Pulse (!) 111    Ht 5\' 7"  (1.702 m)    Wt 278 lb (126.1 kg)    LMP  (LMP Unknown)    Breastfeeding No    BMI 43.54 kg/m    General:  alert, cooperative, and appears stated age   Breasts:  not indicated  Lungs: Comfortable on room air  Wound N/a  GU exam:  not indicated       Assessment:    There are no diagnoses linked to this encounter.  Normal postpartum exam.   Plan:   Essential components of care per ACOG recommendations:  1.  Mood and well being: Patient with negative depression screening today. Reviewed local resources for support.  - Patient tobacco use? No.   - hx of drug use? No.    2. Infant care and feeding:  -Patient currently breastmilk feeding? Yes. Reviewed importance of draining breast regularly to support lactation.  -Social determinants of health (SDOH) reviewed in EPIC. No concerns  3. Sexuality, contraception and birth spacing - Patient does not want a pregnancy in the next year.  Desired family size is unsure number children.  - Reviewed forms of contraception in tiered fashion. Patient desired  Nexplanon  today, she  will return in 1-2 weeks for this.   - Discussed birth spacing of 18 months  4. Sleep and fatigue -Encouraged family/partner/community support of 4 hrs of uninterrupted sleep to help with mood and fatigue  5. Physical Recovery  - Discussed patients delivery and complications. She describes her labor as good. - Patient had a Vaginal, no problems at delivery. Patient had a 1st degree laceration. Perineal healing reviewed. Patient expressed understanding - Patient has urinary incontinence? No. - Patient is safe to resume physical and sexual activity  6.  Health Maintenance - HM due items addressed Yes - Last pap smear No results found for: DIAGPAP Pap smear not done at today's visit.  -Breast Cancer screening indicated?  No.   7. Chronic Disease/Pregnancy Condition follow up: Hypertension, Fibroid, and Abnormal pap smear - will return in 1-2 weeks for Nexplanon placement - cont HCTZ for HTN, well controlled today - large fibroid precluded obtaining pap smear early in pregnancy, will attempt again when she returns for Nexplanon - desires referral to GYN colleague for discussion of fibroid removal  - will also refer to Gen Surg for gallbladder surgery given hx of gallstone pancreatitis during pregnancy - PCP follow up  Clarnce Flock, [place] for [place], [place]

## 2021-01-01 ENCOUNTER — Other Ambulatory Visit: Payer: Self-pay

## 2021-01-01 ENCOUNTER — Ambulatory Visit (INDEPENDENT_AMBULATORY_CARE_PROVIDER_SITE_OTHER): Payer: Medicaid Other | Admitting: Family Medicine

## 2021-01-01 DIAGNOSIS — K851 Biliary acute pancreatitis without necrosis or infection: Secondary | ICD-10-CM

## 2021-01-01 DIAGNOSIS — O10919 Unspecified pre-existing hypertension complicating pregnancy, unspecified trimester: Secondary | ICD-10-CM

## 2021-01-08 ENCOUNTER — Ambulatory Visit: Payer: Medicaid Other | Admitting: Clinical

## 2021-01-08 ENCOUNTER — Other Ambulatory Visit: Payer: Self-pay

## 2021-01-08 DIAGNOSIS — Z91199 Patient's noncompliance with other medical treatment and regimen due to unspecified reason: Secondary | ICD-10-CM

## 2021-01-15 ENCOUNTER — Ambulatory Visit: Payer: Medicaid Other | Admitting: Family Medicine

## 2021-03-04 ENCOUNTER — Encounter: Payer: Self-pay | Admitting: Family Medicine

## 2021-03-05 ENCOUNTER — Ambulatory Visit: Payer: Medicaid Other | Admitting: Family Medicine

## 2021-03-06 ENCOUNTER — Ambulatory Visit (HOSPITAL_COMMUNITY): Admission: RE | Admit: 2021-03-06 | Payer: Medicaid Other | Source: Ambulatory Visit

## 2021-04-03 ENCOUNTER — Ambulatory Visit (INDEPENDENT_AMBULATORY_CARE_PROVIDER_SITE_OTHER): Payer: Medicaid Other | Admitting: Family Medicine

## 2021-04-03 ENCOUNTER — Other Ambulatory Visit (HOSPITAL_COMMUNITY)
Admission: RE | Admit: 2021-04-03 | Discharge: 2021-04-03 | Disposition: A | Discharge status: Home / Self Care | Payer: Medicaid Other | Source: Ambulatory Visit | Attending: Family Medicine | Admitting: Family Medicine

## 2021-04-03 ENCOUNTER — Encounter: Payer: Self-pay | Admitting: Family Medicine

## 2021-04-03 ENCOUNTER — Other Ambulatory Visit: Payer: Self-pay

## 2021-04-03 VITALS — BP 134/74 | HR 93

## 2021-04-03 DIAGNOSIS — Z124 Encounter for screening for malignant neoplasm of cervix: Secondary | ICD-10-CM | POA: Insufficient documentation

## 2021-04-03 DIAGNOSIS — Z113 Encounter for screening for infections with a predominantly sexual mode of transmission: Secondary | ICD-10-CM

## 2021-04-03 DIAGNOSIS — Z30017 Encounter for initial prescription of implantable subdermal contraceptive: Secondary | ICD-10-CM | POA: Diagnosis not present

## 2021-04-03 DIAGNOSIS — Z975 Presence of (intrauterine) contraceptive device: Secondary | ICD-10-CM | POA: Insufficient documentation

## 2021-04-03 LAB — POCT PREGNANCY, URINE: Preg Test, Ur: NEGATIVE

## 2021-04-03 MED ORDER — ETONOGESTREL 68 MG ~~LOC~~ IMPL
68.0000 mg | DRUG_IMPLANT | Freq: Once | SUBCUTANEOUS | Status: AC
  Administered 2021-04-03: 68 mg via SUBCUTANEOUS

## 2021-04-03 NOTE — Progress Notes (Signed)
? ? ?[facility] GYNECOLOGY OFFICE VISIT NOTE ? ?History:  ? Stacy Fowler is a 31 y.o. G1P1001 here today for pap smear and contraception discussion. ? ?Last attempted to do pap smear at new OB visit, due to gravid state and large anterior fibroid I was unable to locate and perform pap smear ? ?Interested in [place] ?Has been having unprotected intercourse ?Patient's last menstrual period was 03/04/2021 (exact date). ? ?Health Maintenance Due  ?Topic Date Due  ? PAP SMEAR-Modifier  10/11/2017  ? COVID-19 Vaccine (3 - Booster for Pfizer series) 11/15/2019  ? ? ?Past Medical History:  ?Diagnosis Date  ? Chronic hypertension   ? Gallstone pancreatitis   ? Genital herpes   ? Hyperemesis complicating pregnancy, antepartum   ? Infection   ? UTI  ? Obesity   ? Skin infection   ? ? ?Past Surgical History:  ?Procedure Laterality Date  ? BREAST REDUCTION SURGERY Bilateral 2009  ? ? ?The following portions of the patient's history were reviewed and updated as appropriate: allergies, current medications, past family history, past medical history, past social history, past surgical history and problem list.  ? ?Health Maintenance:   ?Last pap: ?No results found for: DIAGPAP, HPV, HPVHIGH ?Collected today ? ?Last mammogram:  ?N/a  ? ? ?Review of Systems:  ?Pertinent items noted in HPI and remainder of comprehensive ROS otherwise negative. ? ?Physical Exam:  ?BP 134/74   Pulse 93   LMP 03/04/2021 (Exact Date)   Breastfeeding No  ?CONSTITUTIONAL: Well-developed, well-nourished female in no acute distress.  ?HEENT:  Normocephalic, atraumatic. External right and left ear normal. No scleral icterus.  ?NECK: Normal range of motion, supple, no masses noted on observation ?SKIN: No rash noted. Not diaphoretic. No erythema. No pallor. ?MUSCULOSKELETAL: Normal range of motion. No edema noted. ?NEUROLOGIC: Alert and oriented to person, place, and time. Normal muscle tone coordination.  ?PSYCHIATRIC: Normal mood and affect.  Normal behavior. Normal judgment and thought content. ?RESPIRATORY: Effort normal, no problems with respiration noted ?PELVIC:  Normal external genitalia, normal vagina, normal cervix. No discharge.  ? ?Labs and Imaging ?Results for orders placed or performed in visit on 04/03/21 (from the past 168 hour(s))  ?Pregnancy, urine POC  ? Collection Time: 04/03/21  8:56 AM  ?Result Value Ref Range  ? Preg Test, Ur NEGATIVE NEGATIVE  ? ?No results found.    ?Assessment and Plan:  ? ?Problem List Items Addressed This Visit   ? ?  ? Other  ? Nexplanon in place  ?  After counseling on different methods elected for Nexplanon which she has had in the past. See procedure note.  ?  ?  ? Screening for cervical cancer  ?  Pap collected today without issue, no obstructing fibroid present ?  ?  ? Relevant Orders  ? Cytology - PAP( [facility])  ? ?Other Visit Diagnoses   ? ? Encounter for initial prescription of implantable subdermal contraceptive    -  Primary  ? Relevant Orders  ? Pregnancy, urine POC (Completed)  ? Screening examination for sexually transmitted disease      ? Relevant Orders  ? Cytology - PAP( [facility])  ? ?  ? ? ?Routine preventative health maintenance measures emphasized. ?Please refer to After Visit Summary for other counseling recommendations.  ? ?Return in 1 year (on 04/04/2022).   ? ?Total face-to-face time with patient: 20 minutes.  Over 50% of encounter was spent on counseling and coordination of care. ? ? ?Rodman Key  Lamount Cohen, MD/MPH ?Attending Family Medicine Physician, Faculty Practice ?[facility] ? ?

## 2021-04-03 NOTE — Patient Instructions (Signed)
Preventive Care 61-32 Years Old, Female ?Preventive care refers to lifestyle choices and visits with your health care provider that can promote health and wellness. Preventive care visits are also called wellness exams. ?What can I expect for my preventive care visit? ?Counseling ?During your preventive care visit, your health care provider may ask about your: ?Medical history, including: ?Past medical problems. ?Family medical history. ?Pregnancy history. ?Current health, including: ?Menstrual cycle. ?Method of birth control. ?Emotional well-being. ?Home life and relationship well-being. ?Sexual activity and sexual health. ?Lifestyle, including: ?Alcohol, nicotine or tobacco, and drug use. ?Access to firearms. ?Diet, exercise, and sleep habits. ?Work and work Statistician. ?Sunscreen use. ?Safety issues such as seatbelt and bike helmet use. ?Physical exam ?Your health care provider may check your: ?Height and weight. These may be used to calculate your BMI (body mass index). BMI is a measurement that tells if you are at a healthy weight. ?Waist circumference. This measures the distance around your waistline. This measurement also tells if you are at a healthy weight and may help predict your risk of certain diseases, such as type 2 diabetes and high blood pressure. ?Heart rate and blood pressure. ?Body temperature. ?Skin for abnormal spots. ?What immunizations do I need? ?Vaccines are usually given at various ages, according to a schedule. Your health care provider will recommend vaccines for you based on your age, medical history, and lifestyle or other factors, such as travel or where you work. ?What tests do I need? ?Screening ?Your health care provider may recommend screening tests for certain conditions. This may include: ?Pelvic exam and Pap test. ?Lipid and cholesterol levels. ?Diabetes screening. This is done by checking your blood sugar (glucose) after you have not eaten for a while (fasting). ?Hepatitis B  test. ?Hepatitis C test. ?HIV (human immunodeficiency virus) test. ?STI (sexually transmitted infection) testing, if you are at risk. ?BRCA-related cancer screening. This may be done if you have a family history of breast, ovarian, tubal, or peritoneal cancers. ?Talk with your health care provider about your test results, treatment options, and if necessary, the need for more tests. ?Follow these instructions at home: ?Eating and drinking ? ?Eat a healthy diet that includes fresh fruits and vegetables, whole grains, lean protein, and low-fat dairy products. ?Take vitamin and mineral supplements as recommended by your health care provider. ?Do not drink alcohol if: ?Your health care provider tells you not to drink. ?You are pregnant, may be pregnant, or are planning to become pregnant. ?If you drink alcohol: ?Limit how much you have to 0-1 drink a day. ?Know how much alcohol is in your drink. In the [place] one drink equals one 12 oz bottle of beer (355 mL), one 5 oz glass of wine (148 mL), or one 1? oz glass of hard liquor (44 mL). ?Lifestyle ?Brush your teeth every morning and night with fluoride toothpaste. Floss one time each day. ?Exercise for at least 30 minutes 5 or more days each week. ?Do not use any products that contain nicotine or tobacco. These products include cigarettes, chewing tobacco, and vaping devices, such as e-cigarettes. If you need help quitting, ask your health care provider. ?Do not use drugs. ?If you are sexually active, practice safe sex. Use a condom or other form of protection to prevent STIs. ?If you do not wish to become pregnant, use a form of birth control. If you plan to become pregnant, see your health care provider for a prepregnancy visit. ?Find healthy ways to manage stress, such as: ?Meditation, yoga,  or listening to music. ?Journaling. ?Talking to a trusted person. ?Spending time with friends and family. ?Minimize exposure to UV radiation to reduce your risk of skin  cancer. ?Safety ?Always wear your seat belt while driving or riding in a vehicle. ?Do not drive: ?If you have been drinking alcohol. Do not ride with someone who has been drinking. ?If you have been using any mind-altering substances or drugs. ?While texting. ?When you are tired or distracted. ?Wear a helmet and other protective equipment during sports activities. ?If you have firearms in your house, make sure you follow all gun safety procedures. ?Seek help if you have been physically or sexually abused. ?What's next? ?Go to your health care provider once a year for an annual wellness visit. ?Ask your health care provider how often you should have your eyes and teeth checked. ?Stay up to date on all vaccines. ?This information is not intended to replace advice given to you by your health care provider. Make sure you discuss any questions you have with your health care provider. ?Document Revised: 06/27/2020 Document Reviewed: 06/27/2020 ?Elsevier Patient Education ? [place]. ? ?

## 2021-04-03 NOTE — Assessment & Plan Note (Signed)
Pap collected today without issue, no obstructing fibroid present ?

## 2021-04-03 NOTE — Progress Notes (Signed)
? ? ? ?  GYNECOLOGY OFFICE PROCEDURE NOTE ? ?Stacy Fowler is a 31 y.o. G1P1001 here for Nexplanon insertion.  Last pap smear was done today. ? ?Nexplanon insertion Procedure ?Patient identified, informed consent performed, consent signed.   Patient does understand that irregular bleeding is a very common side effect of this medication. She was advised to have backup contraception for one week after placement. Pregnancy test in clinic today was negative.  Appropriate time out taken.  Patient's right arm was prepped and draped in the usual sterile fashion. The ruler used to measure and mark insertion area.  Patient was prepped with alcohol swab and then injected with 3 ml of 1% lidocaine.  She was prepped with betadine, Nexplanon removed from packaging,  Device confirmed in needle, then inserted full length of needle and withdrawn per handbook instructions. Nexplanon was able to palpated in the patient's arm; patient palpated the insert herself. There was minimal blood loss.  Patient insertion site covered with guaze and a pressure bandage to reduce any bruising.  The patient tolerated the procedure well and was given post procedure instructions. ? ?Clarnce Flock, MD/MPH ?Family Medicine, Faculty Practice ?[facility] ? ?

## 2021-04-03 NOTE — Assessment & Plan Note (Signed)
After counseling on different methods elected for Nexplanon which she has had in the past. See procedure note.  ?

## 2021-04-08 LAB — CYTOLOGY - PAP
Chlamydia: NEGATIVE
Comment: NEGATIVE
Comment: NEGATIVE
Comment: NEGATIVE
Comment: NORMAL
Diagnosis: UNDETERMINED — AB
High risk HPV: NEGATIVE
Neisseria Gonorrhea: NEGATIVE
Trichomonas: NEGATIVE

## 2021-08-01 ENCOUNTER — Other Ambulatory Visit (HOSPITAL_COMMUNITY): Payer: Self-pay

## 2021-08-15 ENCOUNTER — Other Ambulatory Visit (HOSPITAL_COMMUNITY): Payer: Self-pay

## 2021-09-09 ENCOUNTER — Encounter: Payer: Self-pay | Admitting: Family Medicine

## 2021-09-09 ENCOUNTER — Ambulatory Visit (INDEPENDENT_AMBULATORY_CARE_PROVIDER_SITE_OTHER): Payer: Medicaid Other | Admitting: Family Medicine

## 2021-09-09 DIAGNOSIS — T7491XA Unspecified adult maltreatment, confirmed, initial encounter: Secondary | ICD-10-CM | POA: Diagnosis not present
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
## 2021-09-09 NOTE — BH Specialist Note (Signed)
Pt did not arrive to video visit and did not answer the phone; Left HIPPA-compliant message to call back Stacy Fowler from [facility] at  [phone] (Shifra Swartzentruber's office).  ?; left MyChart message for patient.  ? ?

## 2021-09-17 ENCOUNTER — Ambulatory Visit: Payer: Medicaid Other | Admitting: Clinical

## 2021-09-17 DIAGNOSIS — Z91199 Patient's noncompliance with other medical treatment and regimen due to unspecified reason: Secondary | ICD-10-CM

## 2021-09-24 ENCOUNTER — Other Ambulatory Visit (HOSPITAL_COMMUNITY): Payer: Self-pay

## 2021-09-25 ENCOUNTER — Other Ambulatory Visit (HOSPITAL_COMMUNITY): Payer: Self-pay

## 2021-10-02 ENCOUNTER — Other Ambulatory Visit (HOSPITAL_COMMUNITY): Payer: Self-pay

## 2021-11-22 ENCOUNTER — Other Ambulatory Visit: Payer: Self-pay

## 2021-11-22 ENCOUNTER — Other Ambulatory Visit: Payer: Self-pay | Admitting: Family Medicine

## 2021-11-22 ENCOUNTER — Other Ambulatory Visit (HOSPITAL_COMMUNITY): Payer: Self-pay

## 2021-11-22 DIAGNOSIS — A609 Anogenital herpesviral infection, unspecified: Secondary | ICD-10-CM

## 2021-11-22 MED ORDER — VALACYCLOVIR HCL 500 MG PO TABS
500.0000 mg | ORAL_TABLET | Freq: Two times a day (BID) | ORAL | 2 refills | Status: DC
  Filled 2021-11-22 – 2022-02-10 (×2): qty 60, 30d supply, fill #0

## 2021-11-22 NOTE — Progress Notes (Signed)
Rx Valtrex refill per Dr Dione Plover.  Colletta [place], RNC

## 2021-12-06 ENCOUNTER — Other Ambulatory Visit (HOSPITAL_COMMUNITY): Payer: Self-pay

## 2022-02-10 ENCOUNTER — Encounter: Payer: Self-pay | Admitting: Family Medicine

## 2022-02-10 ENCOUNTER — Other Ambulatory Visit: Payer: Self-pay

## 2022-02-10 ENCOUNTER — Other Ambulatory Visit (HOSPITAL_COMMUNITY): Payer: Self-pay

## 2022-02-10 DIAGNOSIS — A609 Anogenital herpesviral infection, unspecified: Secondary | ICD-10-CM

## 2022-02-10 MED ORDER — VALACYCLOVIR HCL 500 MG PO TABS
500.0000 mg | ORAL_TABLET | Freq: Two times a day (BID) | ORAL | 2 refills | Status: DC
  Filled 2022-02-10: qty 60, 30d supply, fill #0

## 2022-03-03 ENCOUNTER — Other Ambulatory Visit (HOSPITAL_COMMUNITY): Payer: Self-pay

## 2022-05-05 ENCOUNTER — Encounter (HOSPITAL_COMMUNITY): Payer: Self-pay

## 2022-05-05 ENCOUNTER — Ambulatory Visit (HOSPITAL_COMMUNITY)
Admission: EM | Admit: 2022-05-05 | Discharge: 2022-05-05 | Disposition: A | Discharge status: Home / Self Care | Payer: Medicaid Other | Attending: Internal Medicine | Admitting: Internal Medicine

## 2022-05-05 ENCOUNTER — Other Ambulatory Visit (HOSPITAL_COMMUNITY): Payer: Self-pay

## 2022-05-05 DIAGNOSIS — J069 Acute upper respiratory infection, unspecified: Secondary | ICD-10-CM | POA: Diagnosis not present

## 2022-05-05 DIAGNOSIS — S39012A Strain of muscle, fascia and tendon of lower back, initial encounter: Secondary | ICD-10-CM | POA: Diagnosis not present

## 2022-05-05 MED ORDER — ACETAMINOPHEN 325 MG PO TABS
ORAL_TABLET | ORAL | Status: AC
  Filled 2022-05-05: qty 3

## 2022-05-05 MED ORDER — KETOROLAC TROMETHAMINE 30 MG/ML IJ SOLN
INTRAMUSCULAR | Status: AC
  Filled 2022-05-05: qty 1

## 2022-05-05 MED ORDER — METHOCARBAMOL 500 MG PO TABS
500.0000 mg | ORAL_TABLET | Freq: Two times a day (BID) | ORAL | 0 refills | Status: DC
  Filled 2022-05-05: qty 20, 10d supply, fill #0

## 2022-05-05 MED ORDER — KETOROLAC TROMETHAMINE 30 MG/ML IJ SOLN
30.0000 mg | Freq: Once | INTRAMUSCULAR | Status: AC
  Administered 2022-05-05: 30 mg via INTRAMUSCULAR

## 2022-05-05 MED ORDER — ACETAMINOPHEN 325 MG PO TABS
975.0000 mg | ORAL_TABLET | Freq: Once | ORAL | Status: AC
  Administered 2022-05-05: 975 mg via ORAL

## 2022-05-05 MED ORDER — BENZONATATE 100 MG PO CAPS
100.0000 mg | ORAL_CAPSULE | Freq: Three times a day (TID) | ORAL | 0 refills | Status: DC
Start: 2022-05-05 — End: 2023-07-30
  Filled 2022-05-05: qty 21, 7d supply, fill #0

## 2022-05-05 MED ORDER — NAPROXEN 375 MG PO TABS
375.0000 mg | ORAL_TABLET | Freq: Two times a day (BID) | ORAL | 0 refills | Status: DC
  Filled 2022-05-05: qty 20, 10d supply, fill #0

## 2022-05-05 NOTE — ED Triage Notes (Signed)
Pt presents to the office for lower back pain. Denies any falls or trauma to her back.  Pt reports a cough for several days.

## 2022-05-05 NOTE — ED Provider Notes (Signed)
[facility]    CSN: [id number] Arrival date & time: 05/05/22  1109      History   Chief Complaint Chief Complaint  Patient presents with   Back Pain   Cough    HPI Stacy Fowler is a 32 y.o. female.   Patient presents to urgent care for evaluation of left lower back pain that started 2 days ago on Saturday, May 03, 2022 and dry cough that started yesterday.  Low back pain is currently a 10 on a scale of 0-10 and does not travel.  Pain is significantly worse with movement and has not responded well to as needed use of Tylenol.   Patient works a sedentary job. Denies urinary symptoms, urinary/stool incontinence, numbness/tingling to the bilateral lower extremities, saddle anesthesia symptoms, weakness, headache, blurry vision, dizziness, and fever/chills. No constipation, diarrhea, nausea, vomiting, or blood/mucous to the stools reported. No recent falls, injuries, or trauma to the area of greatest tenderness. Last dose of Tylenol was at 6 AM this morning.  She does states she has to frequently pick up her 68-year-old daughter and is wondering if this is contributed to low back pain.  No chance of pregnancy.  Nexplanon is in place.  She is currently experiencing withdrawal bleed. Cough is dry nonproductive.  Her child is sick with cough/cold currently.  No history of chronic respiratory problems.  Denies sore throat, ear pain, headache, nasal congestion, sneezing, watery/itchy eyes, and dizziness.  No shortness of breath, chest pain, wheezing, or leg swelling/orthopnea.  No heart palpitations.  Non-smoker, denies drug use.  Has not taken any over-the-counter medications to help with coughing or congestion.   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, not applicable or unspecified fetus 12/04/2020   Chronic hypertension affecting pregnancy 11/29/2020   Anemia during pregnancy in second trimester 08/29/2020   Fibroid uterus 06/27/2020   Hyperthyroidism 06/11/2020   Hypokalemia 06/11/2020   Hyponatremia 06/11/2020   Abnormal LFTs 06/11/2020   AKI (acute kidney injury) 06/11/2020   Acute gallstone pancreatitis 06/10/2020   Chlamydial infection 04/23/2020   HPV in female 07/28/2016   HYPERTENSION, BENIGN ESSENTIAL 11/26/2009   HSV (herpes simplex virus) anogenital infection 04/26/2009   MORBID OBESITY 04/26/2009    Past Surgical History:  Procedure Laterality Date   BREAST REDUCTION SURGERY Bilateral 2009    OB History     Gravida  1   Para  1   Term  1   Preterm      AB      Living  1      SAB      IAB      Ectopic      Multiple  0   Live Births  1            Home Medications    Prior to Admission medications   Medication Sig Start Date End Date Taking? Authorizing Provider  benzonatate (TESSALON) 100 MG capsule Take 1 capsule (100 mg total) by mouth every 8 (eight) hours. 05/05/22  Yes Carlisle Beers, FNP  methocarbamol (ROBAXIN) 500 MG tablet Take 1 tablet (500 mg total) by mouth 2 (two) times daily. 05/05/22  Yes Carlisle Beers, FNP  naproxen (NAPROSYN) 375 MG tablet Take 1 tablet (375 mg total) by mouth 2 (two) times daily. 05/05/22  Yes Carlisle Beers, FNP  valACYclovir (VALTREX) 500 MG tablet Take 1 tablet (500 mg total) by mouth 2 (two) times daily. 02/10/22   Venora Maples, MD  fluticasone (FLONASE) 50 MCG/ACT nasal spray Place 2 sprays into both nostrils daily. 04/23/17 07/14/19  Cathie Hoops, Amy V, PA-C  ipratropium (ATROVENT) 0.06 % nasal spray Place 2 sprays into both nostrils 4 (four) times daily. 04/23/17 07/14/19  Cathie Hoops, Amy V, PA-C   sodium chloride (OCEAN) 0.65 % SOLN nasal spray Place 1 spray into both nostrils 3 (three) times daily as needed for congestion.  07/14/19  [provider]    Family History Family History  Problem Relation Age of Onset   Healthy Mother    Diabetes Other    Hypertension Other     Social History Social History   Tobacco Use   Smoking status: Never   Smokeless tobacco: Never  Vaping Use   Vaping Use: Never used  Substance Use Topics   Alcohol use: Not Currently    Comment: occasionally   Drug use: No     Allergies   Other   Review of Systems Review of Systems  Respiratory:  Positive for cough.   Musculoskeletal:  Positive for back pain.  Per HPI   Physical Exam Triage Vital Signs ED Triage Vitals  Enc Vitals Group     BP 05/05/22 1227 (!) 153/97     Pulse Rate 05/05/22 1230 98     Resp 05/05/22 1227 18     Temp 05/05/22 1230 98.1 F (36.7 C)     Temp Source 05/05/22 1230 Oral     SpO2 05/05/22 1227 98 %     Weight --      Height --      Head Circumference --      Peak Flow --      Pain Score --      Pain Loc --      Pain Edu? --      Excl. in GC? --    No data found.  Updated Vital Signs BP (!) 153/97 (BP Location: Left Arm)   Pulse 98   Temp 98.1 F (36.7 C) (Oral)   Resp 18   LMP 05/05/2022   SpO2 98%   Visual Acuity Right Eye Distance:   Left Eye Distance:   Bilateral Distance:    Right Eye Near:   Left Eye Near:    Bilateral Near:     Physical Exam Vitals and nursing note reviewed.  Constitutional:      Appearance: She is obese. She is not ill-appearing or toxic-appearing.  HENT:     Head: Normocephalic and atraumatic.     Right Ear: Hearing, tympanic membrane, ear canal and external ear normal.     Left Ear: Hearing, tympanic membrane, ear canal and external ear normal.     Nose: Nose normal.     Mouth/Throat:     Lips: Pink.     Mouth: Mucous membranes are moist. No injury.     Tongue: No lesions. Tongue does not  deviate from midline.     Palate: No mass and lesions.     Pharynx: Oropharynx is clear. Uvula midline. No pharyngeal swelling, oropharyngeal exudate, posterior  oropharyngeal erythema or uvula swelling.     Tonsils: No tonsillar exudate or tonsillar abscesses.  Eyes:     General: Lids are normal. Vision grossly intact. Gaze aligned appropriately.     Extraocular Movements: Extraocular movements intact.     Conjunctiva/sclera: Conjunctivae normal.  Cardiovascular:     Rate and Rhythm: Normal rate and regular rhythm.     Heart sounds: Normal heart sounds, S1 normal and S2 normal.  Pulmonary:     Effort: Pulmonary effort is normal. No respiratory distress.     Breath sounds: Normal breath sounds and air entry. No stridor. No wheezing, rhonchi or rales.  Chest:     Chest wall: No tenderness.  Musculoskeletal:     Cervical back: Normal and neck supple.     Thoracic back: Normal.     Lumbar back: Tenderness (Tender to multiple points over the generalized after lumbar paraspinal musculature of the left side) present. No swelling, edema, deformity, signs of trauma, lacerations, spasms or bony tenderness. Decreased range of motion. No scoliosis.     Right lower leg: No edema.     Left lower leg: No edema.  Lymphadenopathy:     Cervical: No cervical adenopathy.  Skin:    General: Skin is warm and dry.     Capillary Refill: Capillary refill takes less than 2 seconds.     Findings: No rash.  Neurological:     General: No focal deficit present.     Mental Status: She is alert and oriented to person, place, and time. Mental status is at baseline.     Cranial Nerves: Cranial nerves 2-12 are intact. No dysarthria or facial asymmetry.     Sensory: Sensation is intact.     Motor: Motor function is intact.     Coordination: Coordination is intact.     Gait: Gait is intact.     Comments: Strength and sensation intact to bilateral upper and lower extremities (5/5). Moves all 4 extremities with normal  coordination voluntarily. Non-focal neuro exam.   Psychiatric:        Mood and Affect: Mood normal.        Speech: Speech normal.        Behavior: Behavior normal.        Thought Content: Thought content normal.        Judgment: Judgment normal.      UC Treatments / Results  Labs (all labs ordered are listed, but only abnormal results are displayed) Labs Reviewed - No data to display  EKG   Radiology No results found.  Procedures Procedures (including critical care time)  Medications Ordered in UC Medications  acetaminophen (TYLENOL) tablet 975 mg (has no administration in time range)  ketorolac (TORADOL) 30 MG/ML injection 30 mg (has no administration in time range)    Initial Impression / Assessment and Plan / UC Course  I have reviewed the triage vital signs and the nursing notes.  Pertinent labs & imaging results that were available during my care of the patient were reviewed by me and considered in my medical decision making (see chart for details).   1.  Strain of lumbar region Presentation is consistent with acute muscle strain of the low back that will likely resolve with rest, fluids, as needed use of naproxen and muscle relaxer, heat, and gentle range of motion exercises. May take naproxen every 12 hours and robaxin muscle relaxer every 12 hours as needed for muscle spasm. Drowsiness precautions regarding muscle relaxer use discussed.  Ketorolac injection 30 mg IM given today plus Tylenol 975 in clinic.  No NSAID for 24 hours due to ketorolac injection.  Heat and gentle ROM exercises discussed. Deferred imaging today based on stable musculoskeletal exam findings and hemodynamically stable vital signs. Walking referral given to orthopedic provider should symptoms fail to improve in the next 1-2 weeks.   Viral URI Symptoms and physical exam consistent with a viral upper respiratory tract infection that will likely resolve with rest, fluids, and prescriptions for  symptomatic relief. Deferred imaging based on stable cardiopulmonary exam and hemodynamically stable vital signs. Deferred viral testing using shared decision making as patient is low risk for severe disease related to potential COVID-19 illness and there is low suspicion for influenza. Discussed updated CDC guidelines for masking related to COVID-19.   Tessalon perles sent to pharmacy for symptomatic relief to be taken as prescribed.  May continue taking over the counter medications as directed for further symptomatic relief.  Nonpharmacologic interventions for symptom relief provided and after visit summary below. Advised to push fluids to stay well hydrated while recovering from viral illness.   Discussed physical exam and available lab work findings in clinic with patient.  Counseled patient regarding appropriate use of medications and potential side effects for all medications recommended or prescribed today. Discussed red flag signs and symptoms of worsening condition,when to call the PCP office, return to urgent care, and when to seek higher level of care in the emergency department. Patient verbalizes understanding and agreement with plan. All questions answered. Patient discharged in stable condition.    Final Clinical Impressions(s) / UC Diagnoses   Final diagnoses:  Strain of lumbar region, initial encounter  Viral URI with cough     Discharge Instructions      Your pain is likely due to a muscle strain which will improve on its own with time.   - Take naproxen with food every 12 hours as needed for pain and inflammation. Do not take any other NSAID containing medicine when taking naproxen.   - You may start taking naproxen tomorrow since you were given a dose of ketorolac injection in clinic today for pain and inflammation. - You may also take the prescribed muscle relaxer as directed as needed for muscle aches/spasm.  Do not take this medication and drive or drink alcohol as it  can make you sleepy.  Mainly use this medicine at nighttime as needed. - Apply heat 20 minutes on then 20 minutes off and perform gentle range of motion exercises to the area of greatest pain to prevent muscle stiffness and provide further pain relief.   You have a viral upper respiratory infection.  Use the following medicines to help with symptoms: - Plain Mucinex (guaifenesin) over the counter as directed every 12 hours to thin mucous so that you are able to get it out of your body easier. Drink plenty of water while taking this medication so that it works well in your body (at least 8 cups a day).  - Tylenol 1,000mg  as needed for aches/pains. - Tessalon perles every 8 hours as needed for cough.  Red flag symptoms to watch out for are numbness/tingling to the legs, weakness, loss of bowel/bladder control, and/or worsening pain that does not respond well to medicines. Follow-up with your primary care provider or return to urgent care if your symptoms do not improve in the next 3 to 4 days with medications and interventions recommended today. If your symptoms are severe (red flag), please  go to the emergency room.  I hope you feel better!     ED Prescriptions     Medication Sig Dispense Auth. Provider   benzonatate (TESSALON) 100 MG capsule Take 1 capsule (100 mg total) by mouth every 8 (eight) hours. 21 capsule Reita May M, FNP   methocarbamol (ROBAXIN) 500 MG tablet Take 1 tablet (500 mg total) by mouth 2 (two) times daily. 20 tablet Reita May M, FNP   naproxen (NAPROSYN) 375 MG tablet Take 1 tablet (375 mg total) by mouth 2 (two) times daily. 20 tablet Carlisle Beers, FNP      PDMP not reviewed this encounter.   Carlisle Beers, [place] 05/05/22 1317

## 2022-05-05 NOTE — Discharge Instructions (Addendum)
Your pain is likely due to a muscle strain which will improve on its own with time.   - Take naproxen with food every 12 hours as needed for pain and inflammation. Do not take any other NSAID containing medicine when taking naproxen.   - You may start taking naproxen tomorrow since you were given a dose of ketorolac injection in clinic today for pain and inflammation. - You may also take the prescribed muscle relaxer as directed as needed for muscle aches/spasm.  Do not take this medication and drive or drink alcohol as it can make you sleepy.  Mainly use this medicine at nighttime as needed. - Apply heat 20 minutes on then 20 minutes off and perform gentle range of motion exercises to the area of greatest pain to prevent muscle stiffness and provide further pain relief.   You have a viral upper respiratory infection.  Use the following medicines to help with symptoms: - Plain Mucinex (guaifenesin) over the counter as directed every 12 hours to thin mucous so that you are able to get it out of your body easier. Drink plenty of water while taking this medication so that it works well in your body (at least 8 cups a day).  - Tylenol 1,000mg  as needed for aches/pains. - Tessalon perles every 8 hours as needed for cough.  Red flag symptoms to watch out for are numbness/tingling to the legs, weakness, loss of bowel/bladder control, and/or worsening pain that does not respond well to medicines. Follow-up with your primary care provider or return to urgent care if your symptoms do not improve in the next 3 to 4 days with medications and interventions recommended today. If your symptoms are severe (red flag), please go to the emergency room.  I hope you feel better!

## 2022-08-15 IMAGING — US US OB COMP LESS 14 WK
1 series · 15 of 23 positions shown · non-contrast
Comparison: None.

CLINICAL DATA: Bleeding

EXAM:
OBSTETRIC <14 WK ULTRASOUND
TECHNIQUE: Transabdominal ultrasound was performed for evaluation of the
gestation as well as the maternal uterus and adnexal regions.

[Series 1: us ob comp less 14 wk · 15 of 23 slices shown]
[im 1/23]
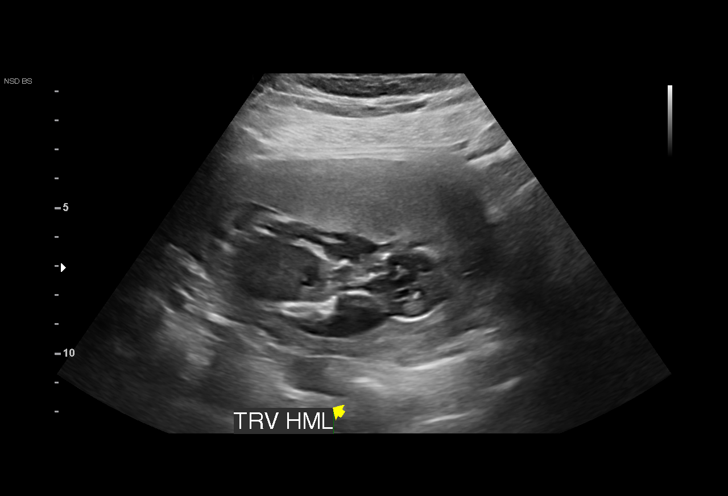
[im 3/23]
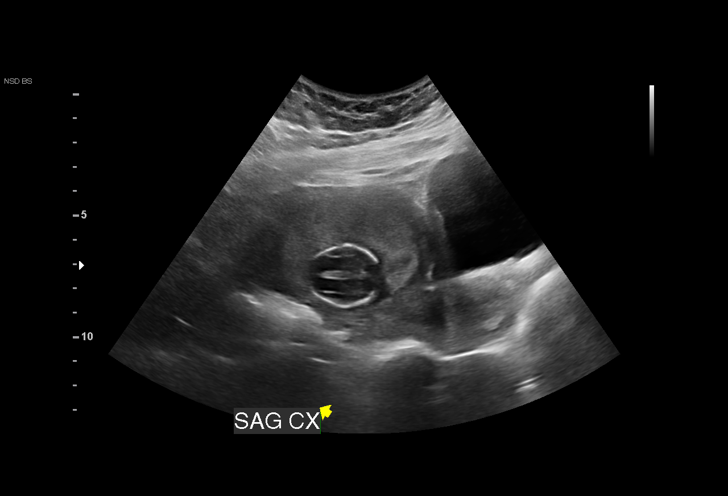
[im 4/23]
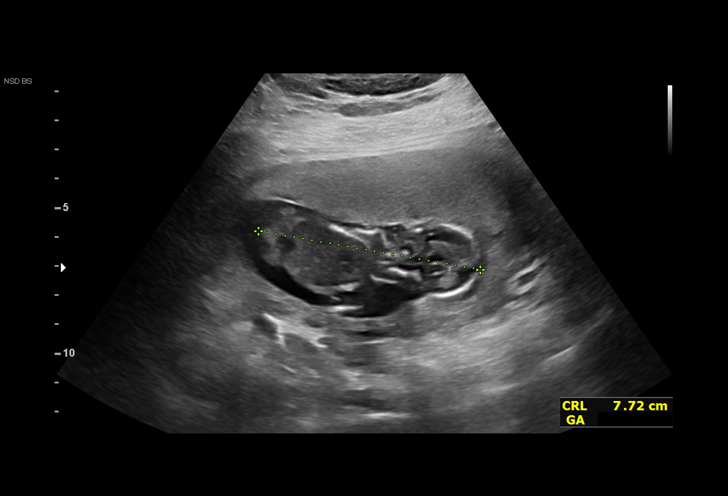
[im 6/23]
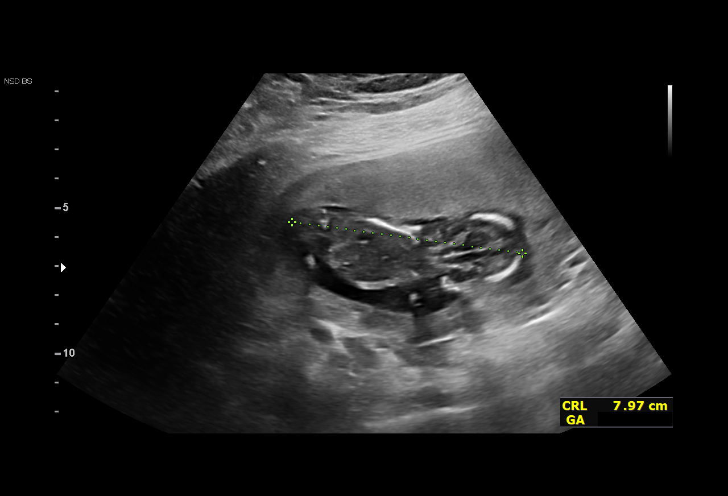
[im 7/23]
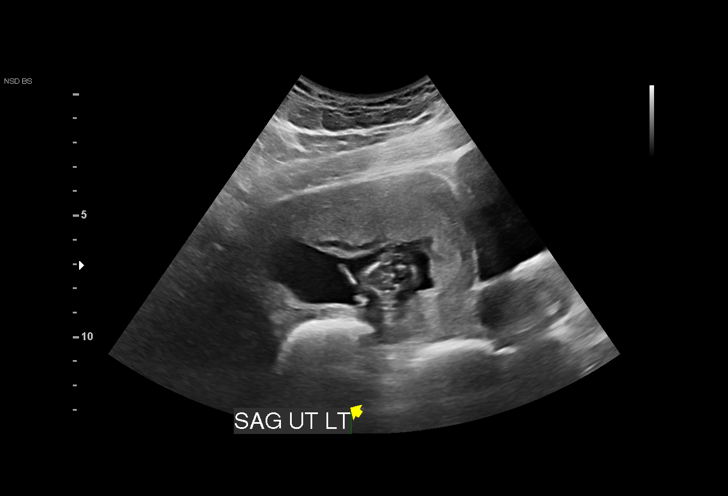
[im 9/23]
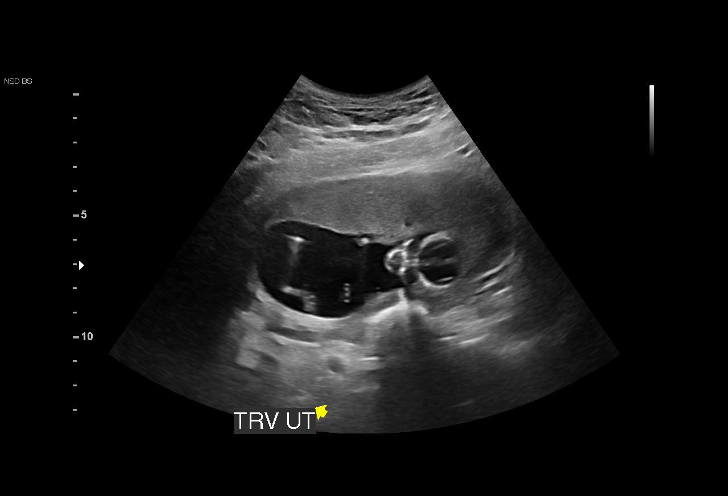
[im 10/23]
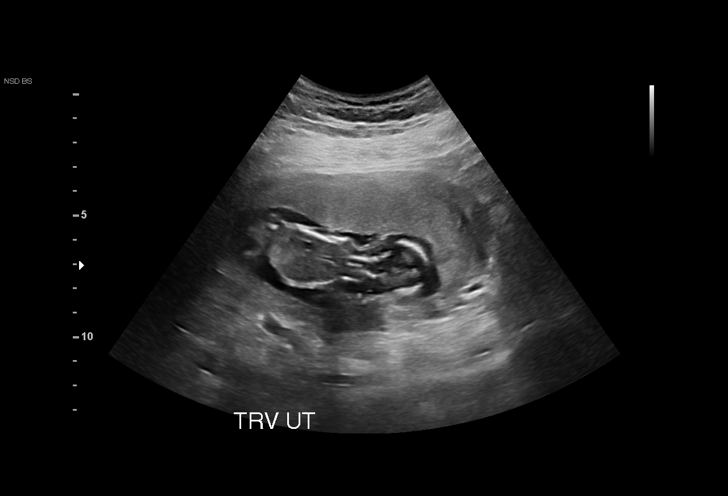
[im 12/23]
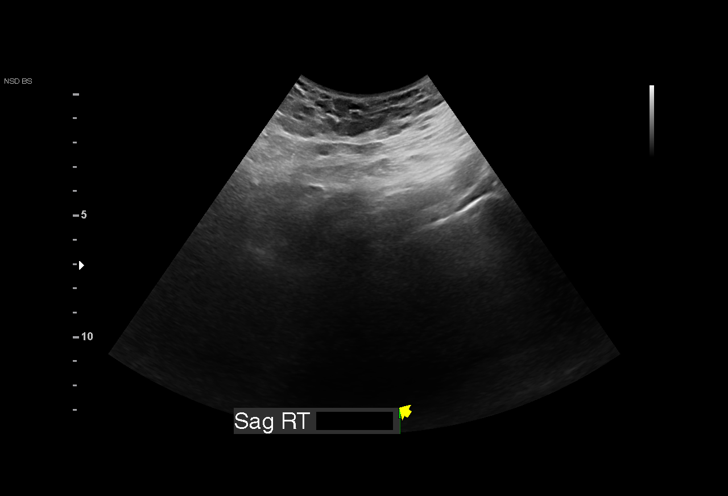
[im 14/23]
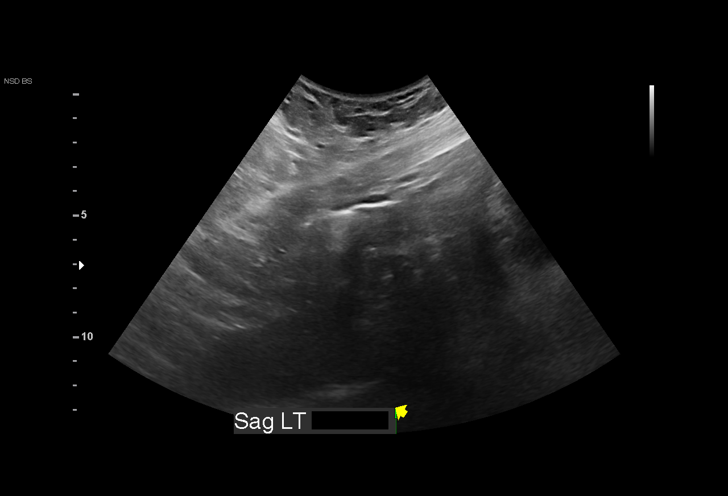
[im 15/23]
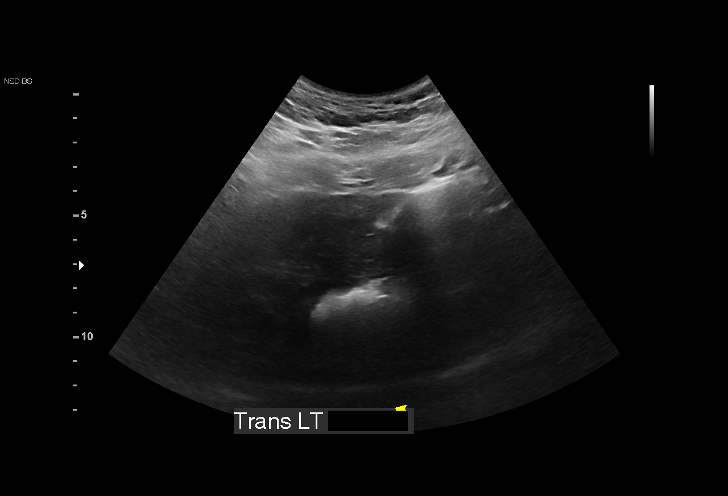
[im 17/23]
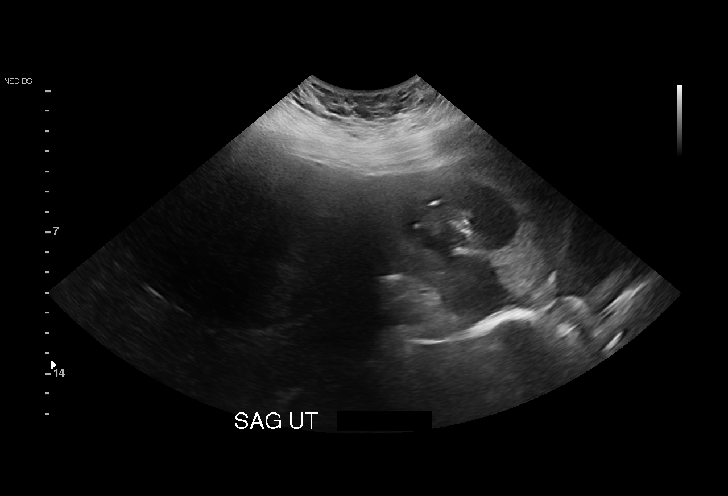
[im 18/23]
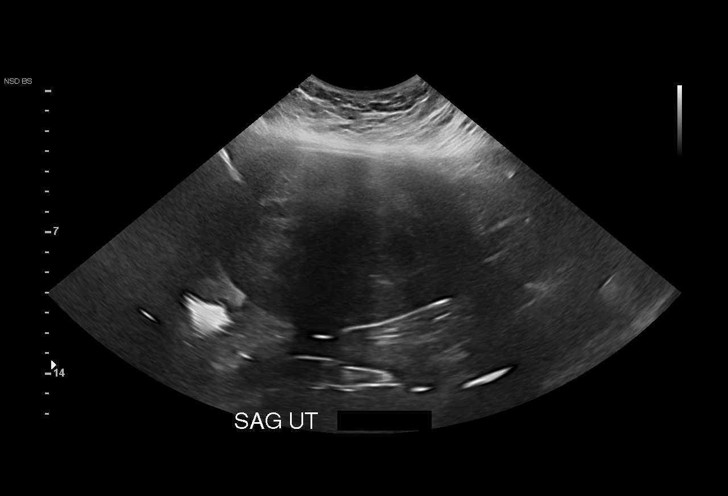
[im 20/23]
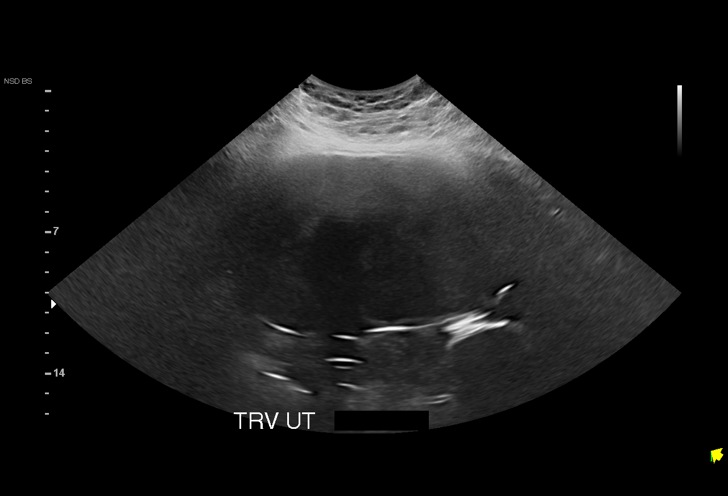
[im 21/23]
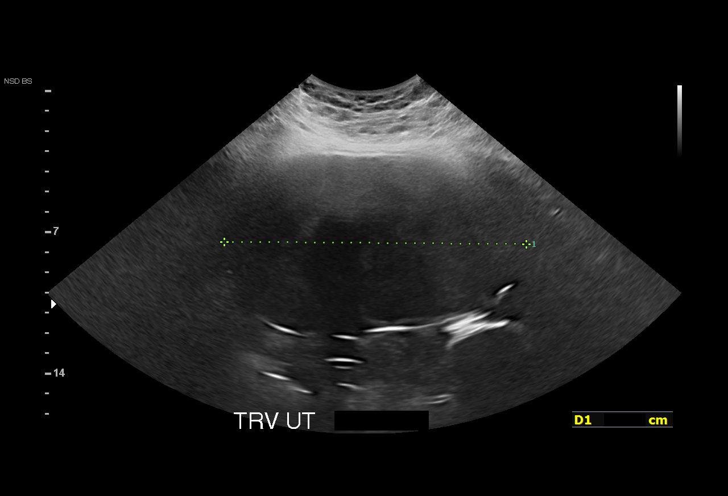
[im 23/23]
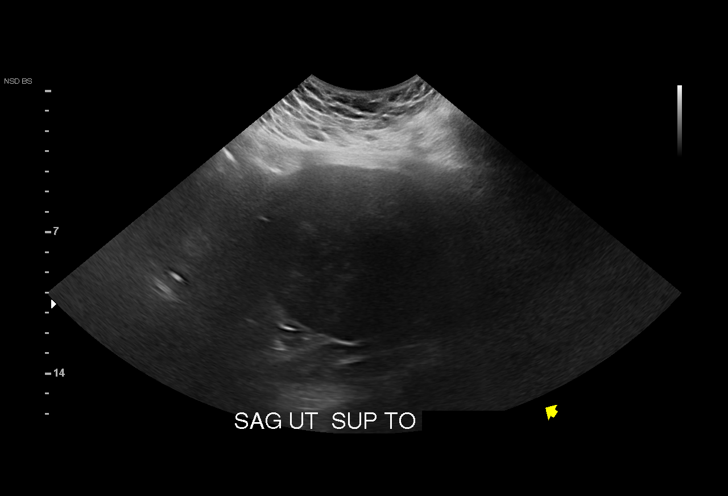

[15 of 23 positions shown; findings below may reference images not displayed]

FINDINGS: Intrauterine gestational sac: Single

Yolk sac:  Not visualized

Embryo:  Visualized

Cardiac Activity: Visualized

Heart Rate: 164 bpm

CRL: 78 mm   13 w 6 d                  US EDC: 12/10/2020

Subchorionic hemorrhage:  None visualized.

Maternal uterus/adnexae: Ovaries nonvisualized. Large exophytic mass
at the uterine fundus measuring 13.4 x 9.3 x 14.9 cm. No free fluid
IMPRESSION: 1. Single viable intrauterine pregnancy as above.
2. Large 14.9 cm exophytic fibroid off the uterine fundus. Otherwise
no specific abnormality is seen.

## 2022-08-15 IMAGING — US US ABDOMEN LIMITED
1 series · 15 of 25 positions shown · non-contrast
Comparison: None.

CLINICAL DATA: Nausea and vomiting, right upper quadrant pain, 13
weeks pregnant, increased liver function tests

EXAM:
ULTRASOUND ABDOMEN LIMITED RIGHT UPPER QUADRANT

[Series 1: us abdomen limited · 31 acquisitions, 15 frames shown]
[im 1/31]
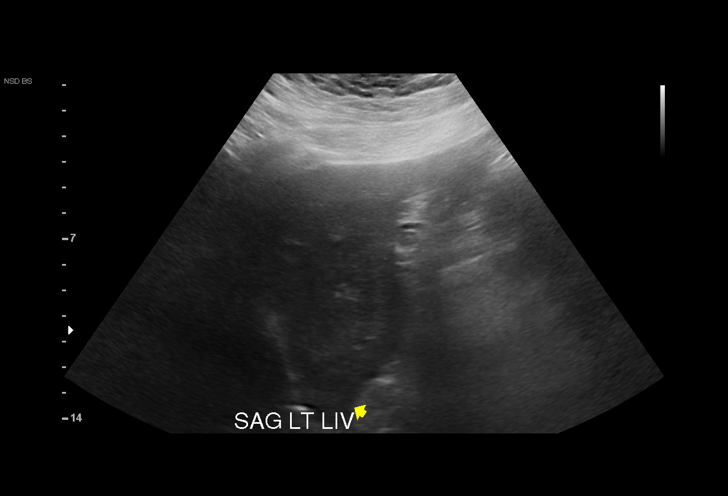
[im 3/31]
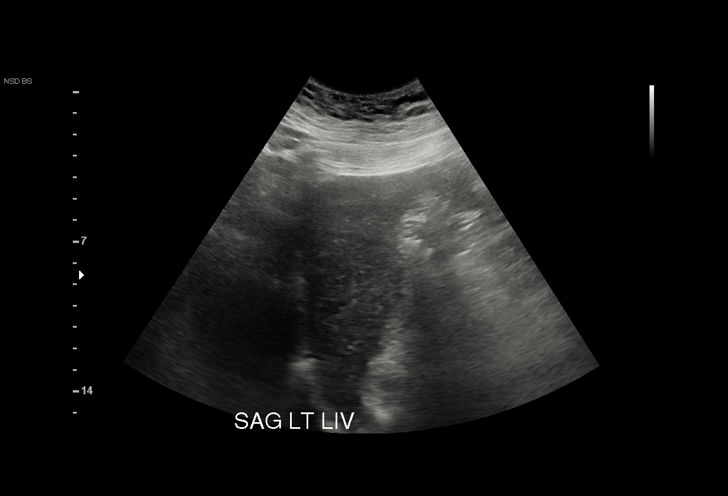
[im 6/31]
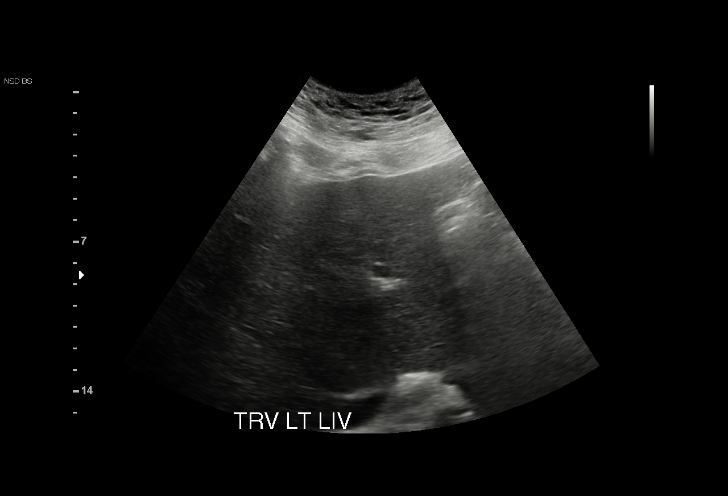
[im 7/31]
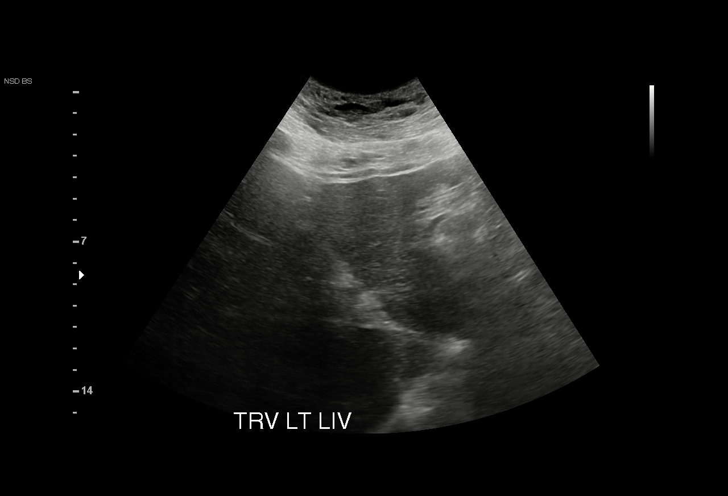
[im 9/31]
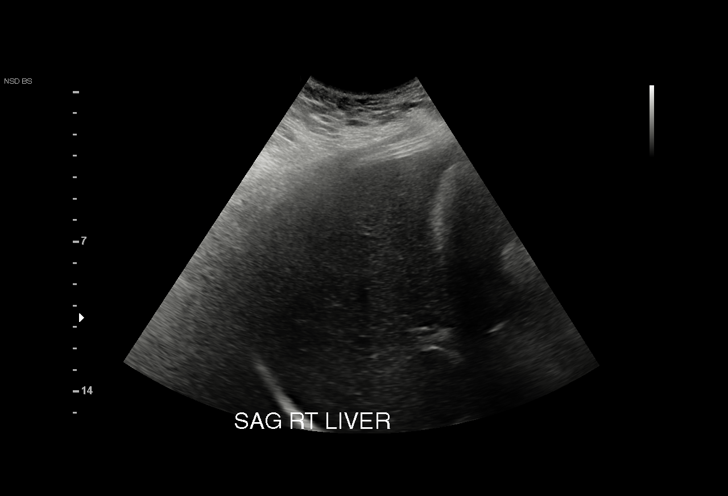
[im 12/31]
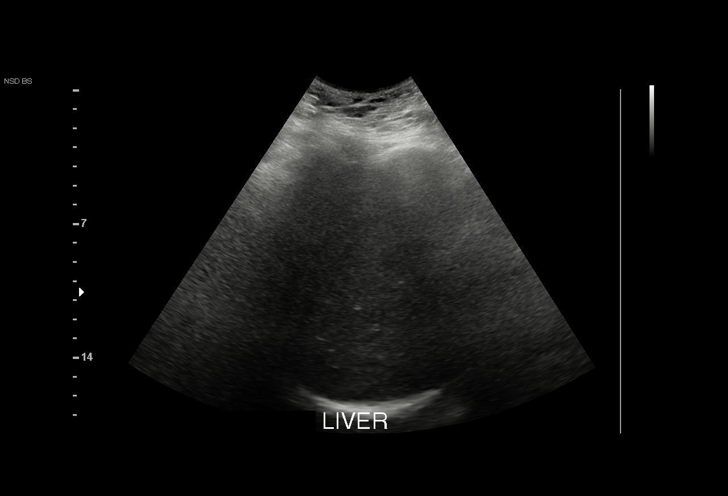
[im 13/31]
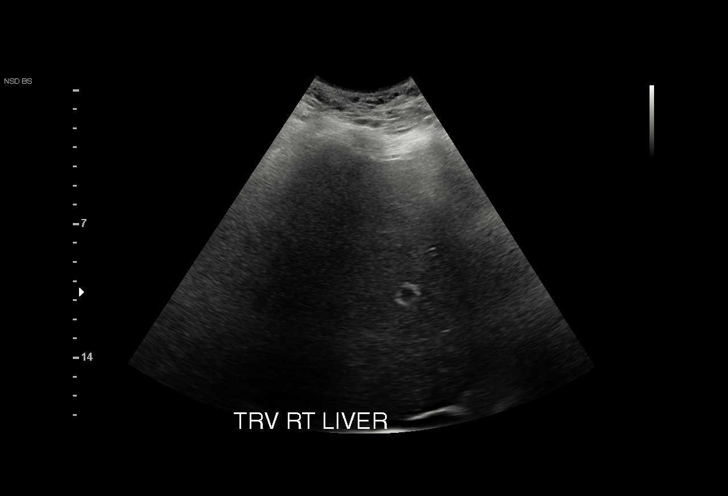
[im 16/31]
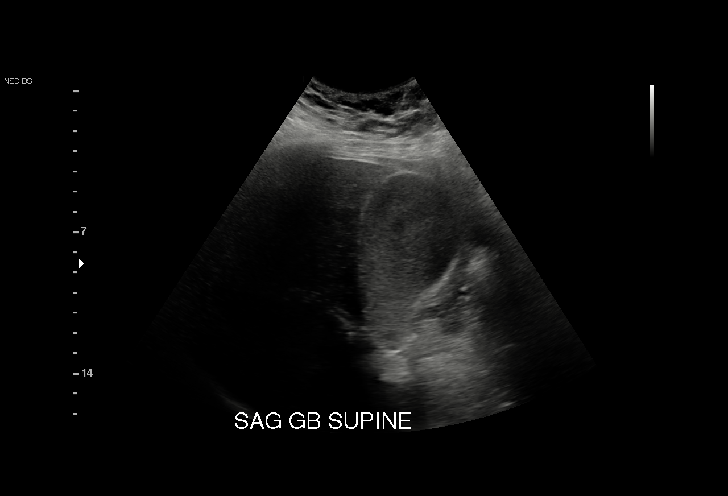
[im 18/31]
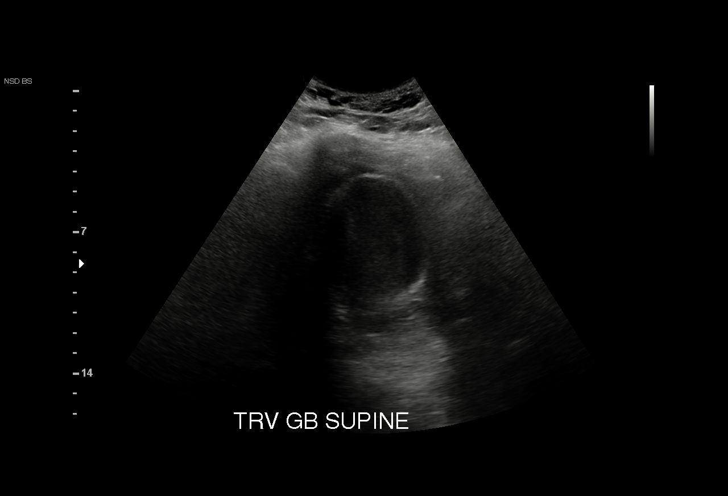
[im 19/31]
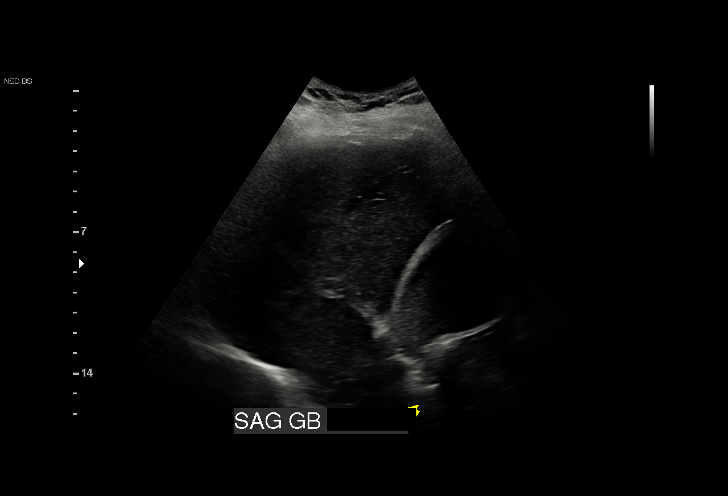
[im 22/31]
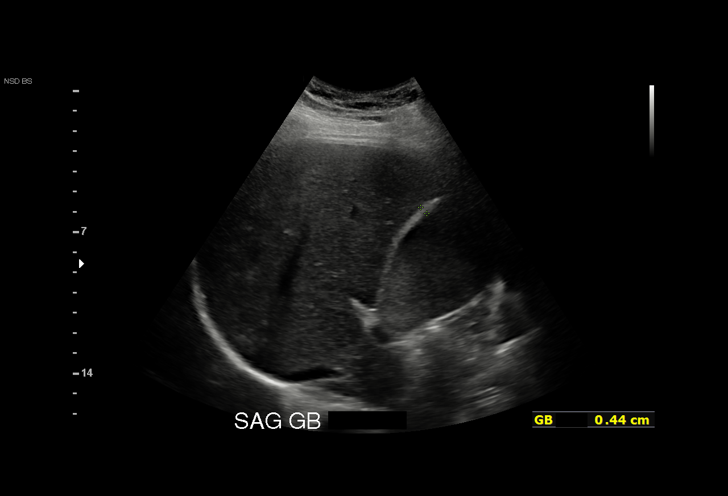
[im 24/31]
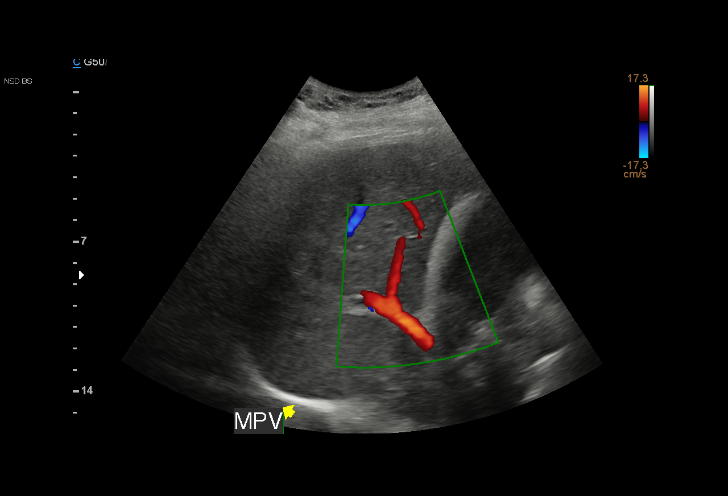
[im 26/31]
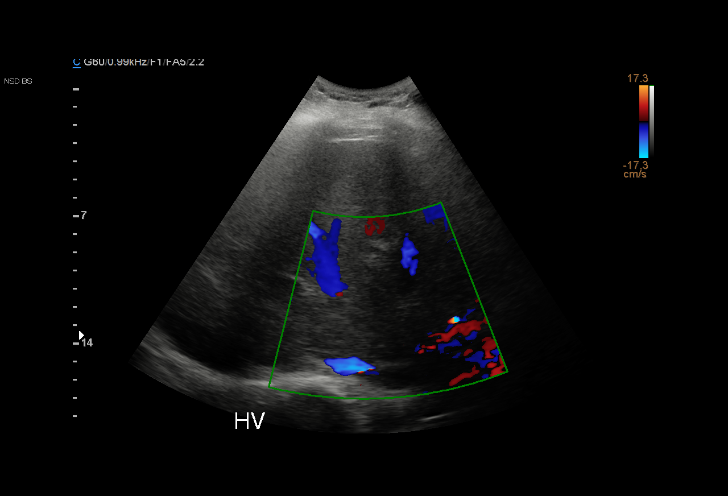
[im 28/31]
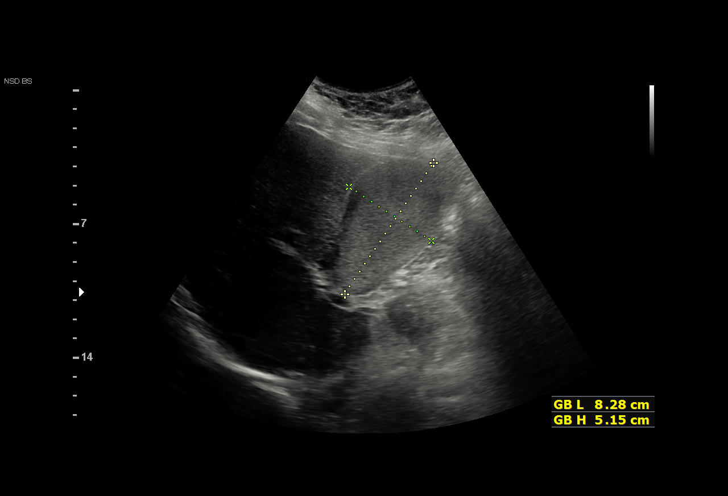
[im 31/31]
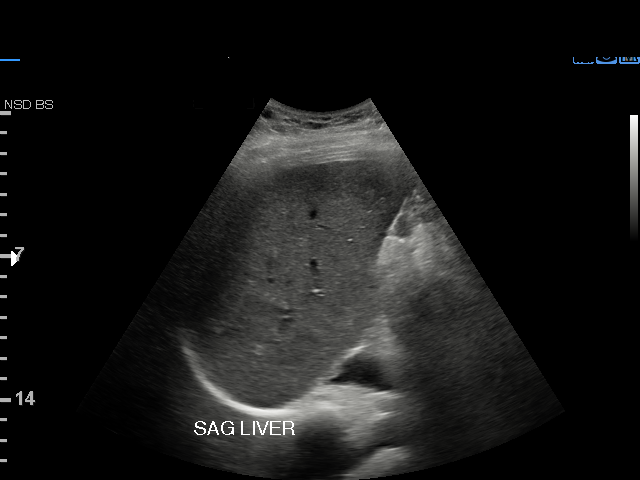

[15 of 25 positions shown; findings below may reference images not displayed]

FINDINGS: Gallbladder:

Gallbladder is moderately distended, with gallbladder sludge
layering dependently. No shadowing gallstones. There is borderline
gallbladder wall thickening measuring 4 mm. No pericholecystic
fluid. Negative sonographic Murphy sign.

Common bile duct:

Diameter: 3 mm

Liver:

No focal lesion identified. Within normal limits in parenchymal
echogenicity. Portal vein is patent on color Doppler imaging with
normal direction of blood flow towards the liver.

Other: None.
IMPRESSION: 1. Distended gallbladder, with internal echogenic sludge and
borderline gallbladder wall thickening. No shadowing gallstones and
negative sonographic Murphy sign. Findings are equivocal for acute
cholecystitis.

## 2022-08-18 IMAGING — DX DG ABD PORTABLE 1V
2 series · 2 of 2 positions shown · non-contrast
Comparison: None.

CLINICAL DATA: Constipation. Nausea and vomiting. Patient is 14
weeks pregnant.

EXAM:
PORTABLE ABDOMEN - 1 VIEW

[abdomen supine (1 of 2)]
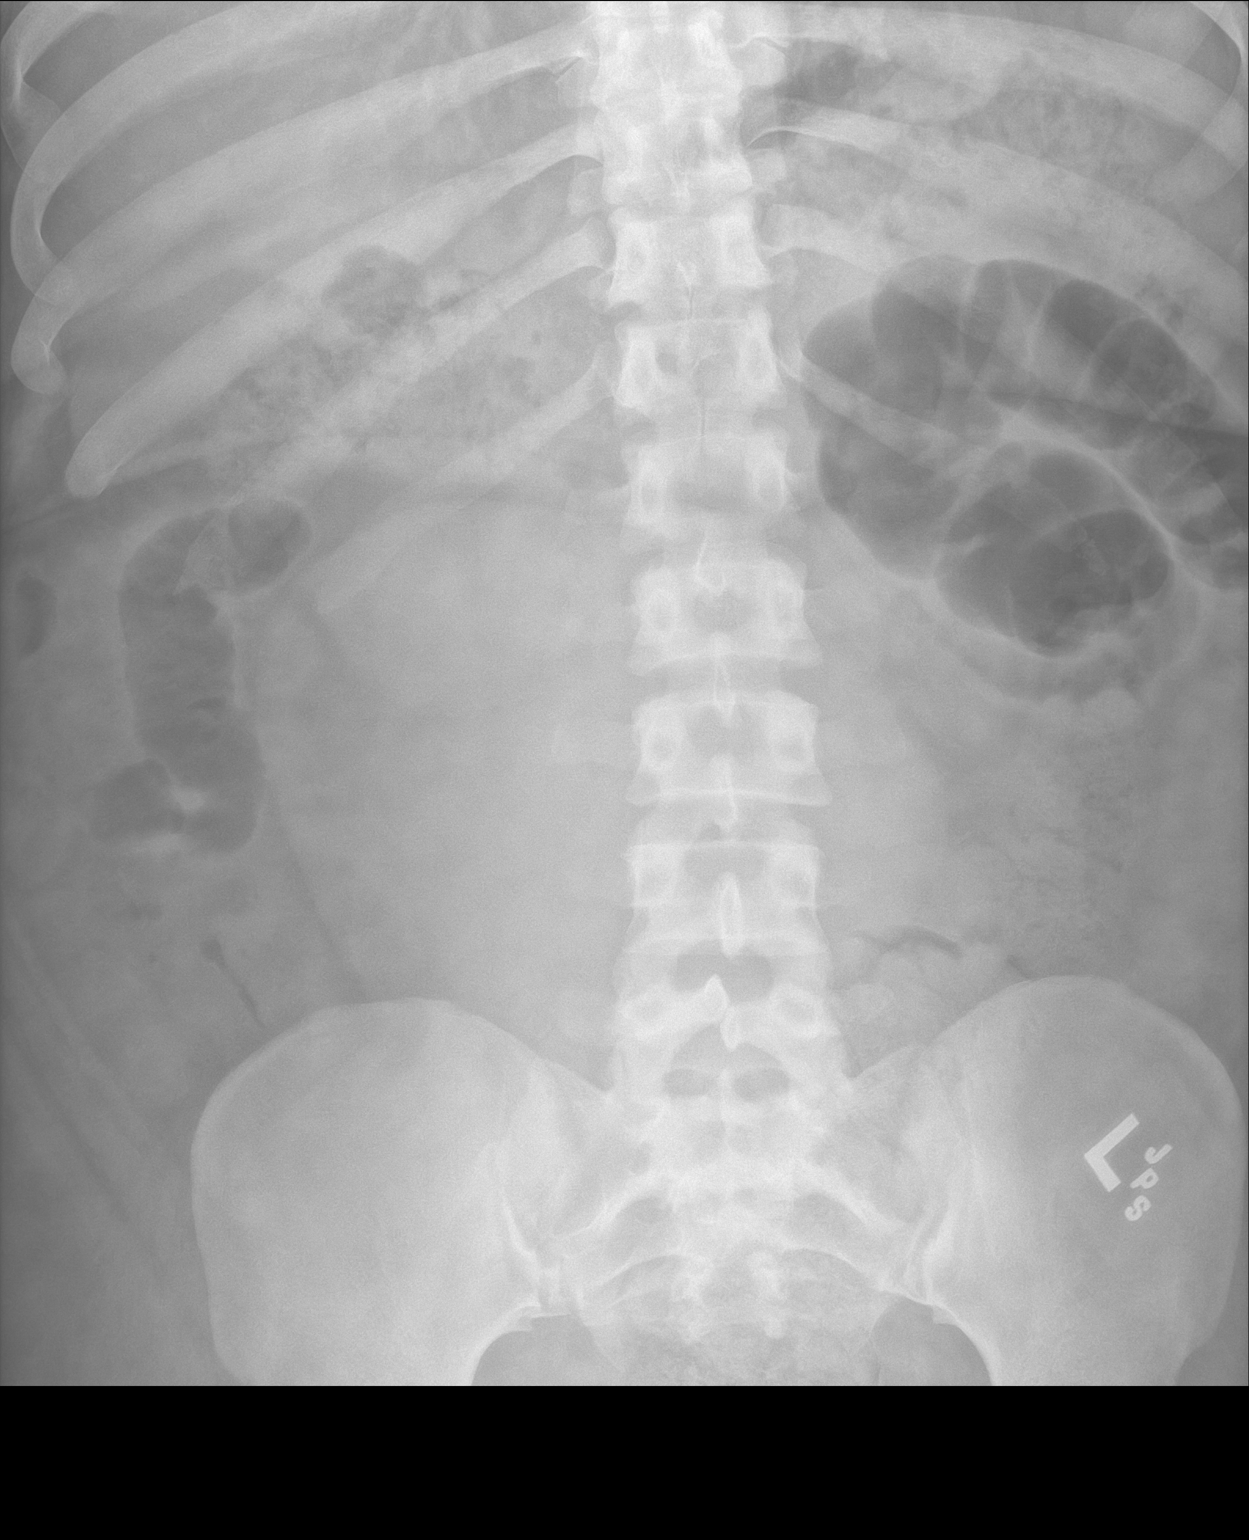

[abdomen supine (2 of 2)]
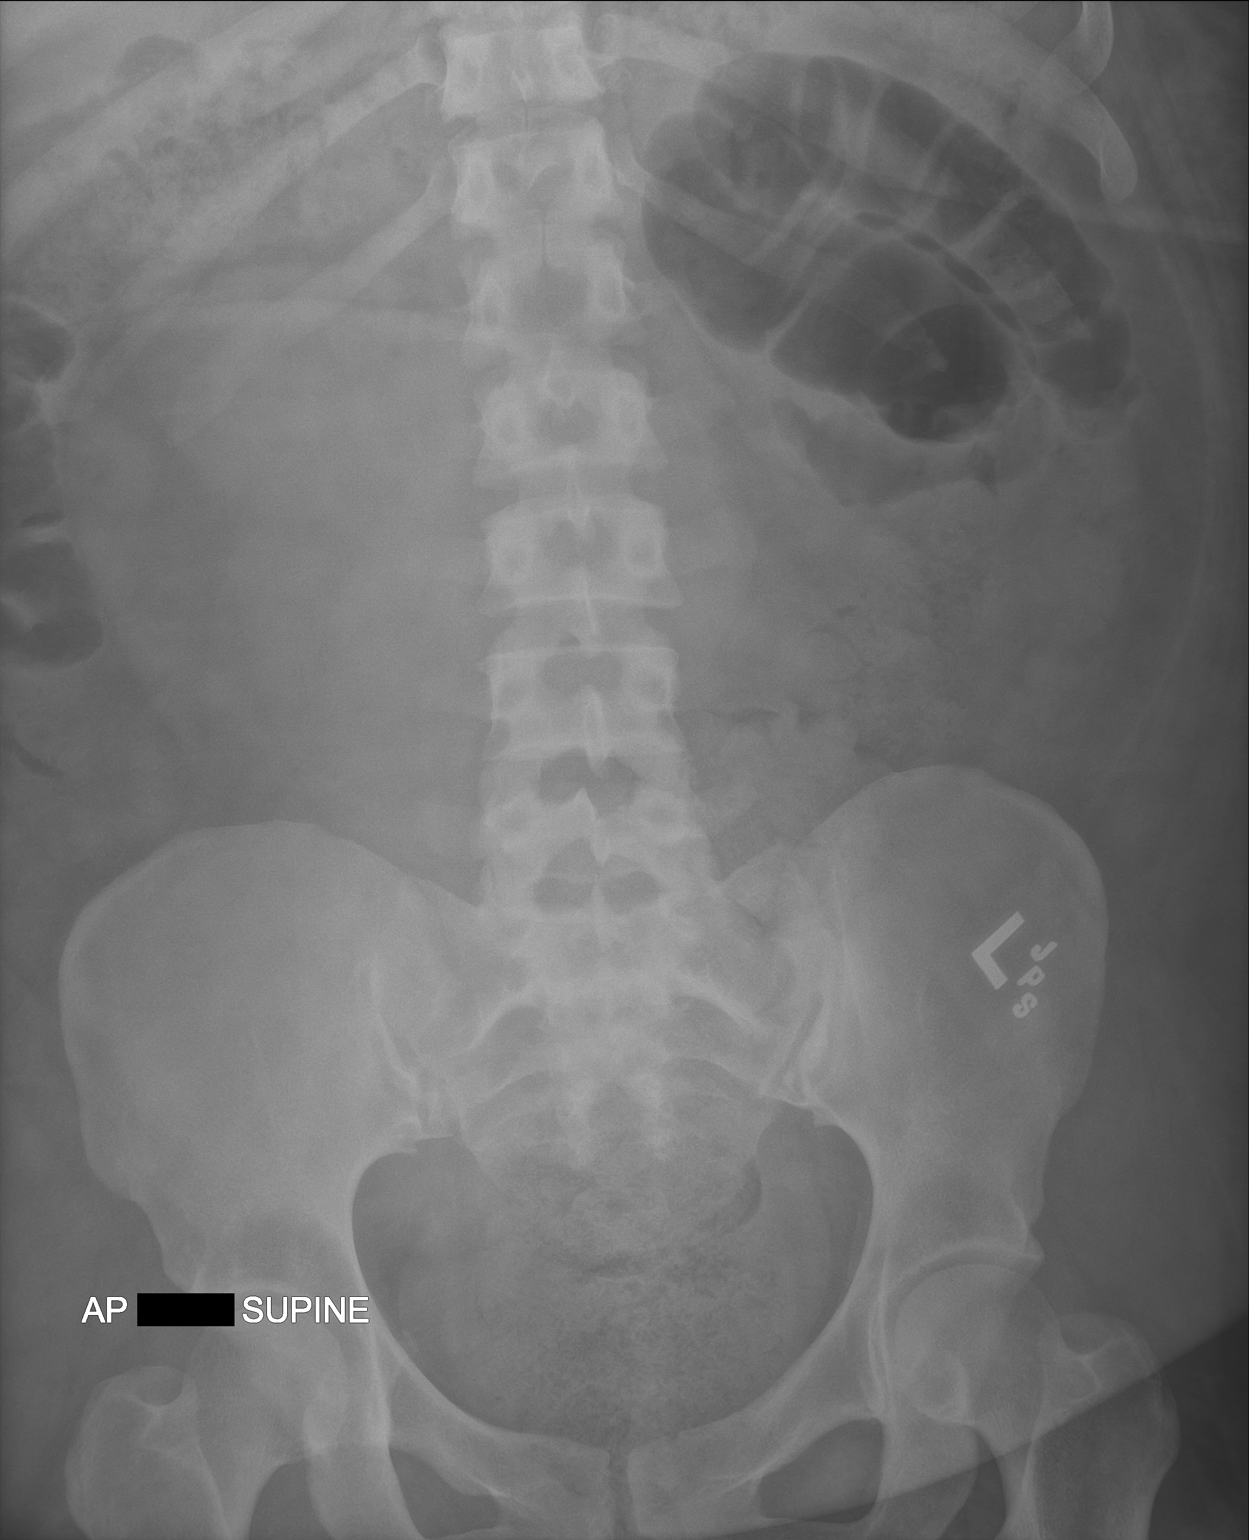

[2 of 2 positions shown; findings below may reference images not displayed]

FINDINGS: Moderate colonic stool burden without evidence of enteric
obstruction.

Nondiagnostic evaluation for pneumoperitoneum secondary to supine
positioning and exclusion of the lower thorax. No pneumatosis or
portal venous gas.

There is incomplete fusion involving the posterior elements of L5.
No acute osseous abnormalities.
IMPRESSION: Moderate colonic stool burden without evidence of enteric
obstruction.

## 2023-03-16 ENCOUNTER — Other Ambulatory Visit: Payer: Self-pay | Admitting: Family Medicine

## 2023-03-16 DIAGNOSIS — A609 Anogenital herpesviral infection, unspecified: Secondary | ICD-10-CM

## 2023-03-17 ENCOUNTER — Encounter: Payer: Self-pay | Admitting: Family Medicine

## 2023-03-18 ENCOUNTER — Other Ambulatory Visit (HOSPITAL_COMMUNITY): Payer: Self-pay

## 2023-03-18 ENCOUNTER — Other Ambulatory Visit: Payer: Self-pay | Admitting: Lactation Services

## 2023-03-18 MED ORDER — VALACYCLOVIR HCL 500 MG PO TABS
500.0000 mg | ORAL_TABLET | Freq: Two times a day (BID) | ORAL | 99 refills | Status: AC
  Filled 2023-03-18: qty 6, 3d supply, fill #0

## 2023-03-18 NOTE — Progress Notes (Signed)
 Patient with current outbreak, requesting refill. Refill sent per standing order.

## 2023-05-14 ENCOUNTER — Ambulatory Visit: Admitting: Family Medicine

## 2023-06-04 ENCOUNTER — Encounter: Payer: Self-pay | Admitting: Family Medicine

## 2023-06-04 ENCOUNTER — Other Ambulatory Visit: Payer: Self-pay

## 2023-06-04 ENCOUNTER — Other Ambulatory Visit (HOSPITAL_COMMUNITY): Payer: Self-pay

## 2023-06-04 ENCOUNTER — Ambulatory Visit (INDEPENDENT_AMBULATORY_CARE_PROVIDER_SITE_OTHER): Admitting: Family Medicine

## 2023-06-04 VITALS — BP 146/94 | HR 90 | Wt 354.0 lb

## 2023-06-04 DIAGNOSIS — Z01419 Encounter for gynecological examination (general) (routine) without abnormal findings: Secondary | ICD-10-CM | POA: Diagnosis not present

## 2023-06-04 DIAGNOSIS — N920 Excessive and frequent menstruation with regular cycle: Secondary | ICD-10-CM | POA: Diagnosis not present

## 2023-06-04 DIAGNOSIS — I10 Essential (primary) hypertension: Secondary | ICD-10-CM

## 2023-06-04 DIAGNOSIS — D259 Leiomyoma of uterus, unspecified: Secondary | ICD-10-CM | POA: Diagnosis not present

## 2023-06-04 DIAGNOSIS — Z975 Presence of (intrauterine) contraceptive device: Secondary | ICD-10-CM

## 2023-06-04 MED ORDER — AMLODIPINE BESYLATE 5 MG PO TABS
5.0000 mg | ORAL_TABLET | Freq: Every day | ORAL | 1 refills | Status: AC
  Filled 2023-06-04 – 2023-09-02 (×2): qty 90, 90d supply, fill #0

## 2023-06-04 NOTE — Assessment & Plan Note (Signed)
 Not at goal today, start amlodipine 5 mg. Shown how to find PCP using MyChart.

## 2023-06-04 NOTE — Assessment & Plan Note (Signed)
 Cancer screening: - Pap: up to date - Mammogram: n/a - Colonoscopy: n/a  Contraception: Nexplanon  placed 04/03/2021, happy with method but having very heavy periods. Has known large fibroid, will get US  to assess burden and see what next steps should be.  Mood: Flowsheet Row Routine Prenatal from 11/20/2020 in [facility] [employer] [facility]  PHQ-9 Total Score 4      Reports she is doing well  Vaccinations: - Flu: n/a - HPV: Discussed, wants to think about it  Metabolic: - DM: prediabetic at beginning of her pregnancy in 2022, since referring to healthy weight and wellness will defer testing - Lipids: not indicated - Obesity: very interested in weight loss, will refer to [facility].   ID: - STI: declines, not sexually active at present

## 2023-06-04 NOTE — Progress Notes (Signed)
 [facility] OFF[facility] VISIT NOTE  History:    Stacy Fowler is a 33 y.o. G1P1001 here today for annual visit.  Main concerns are weight loss and heavy periods  Health Maintenance Due  Topic Date Due   COVID-19 Vaccine (3 - 2024-25 season) 09/14/2022    Past Medical History:  Diagnosis Date   Chronic hypertension    Gallstone pancreatitis    Genital herpes    Hyperemesis complicating pregnancy, antepartum    Infection    UTI   Obesity    Skin infection     Past Surgical History:  Procedure Laterality Date   BREAST REDUCTION SURGERY Bilateral 2009    The following portions of the patient's history were reviewed and updated as appropriate: allergies, current medications, past family history, past medical history, past social history, past surgical history and problem list.   Health Maintenance:   Last pap: Lab Results  Component Value Date   DIAGPAP (A) 04/03/2021    - Atypical squamous cells of undetermined significance (ASC-US )   HPVHIGH Negative 04/03/2021     Last mammogram:  N/a    Review of Systems:  Pertinent items noted in HPI and remainder of comprehensive ROS otherwise negative.  Physical Exam:  BP (!) 146/94   Pulse 90   Wt (!) 354 lb (160.6 kg)   BMI 55.44 kg/m  CONSTITUTIONAL: Well-developed, well-nourished female in no acute distress.  HEENT:  Normocephalic, atraumatic. External right and left ear normal. No scleral icterus.  NECK: Normal range of motion, supple, no masses noted on observation SKIN: No rash noted. Not diaphoretic. No erythema. No pallor. MUSCULOSKELETAL: Normal range of motion. No edema noted. NEUROLOGIC: Alert and oriented to person, place, and time. Normal muscle tone coordination.  PSYCHIATRIC: Normal mood and affect. Normal behavior. Normal judgment and thought content. RESPIRATORY: Effort normal, no problems with respiration noted   Labs and Imaging No results found for this or any previous visit  (from the past week). No results found.    Assessment and Plan:   Problem List Items Addressed This Visit       Cardiovascular and Mediastinum   HYPERTENSION, BENIGN ESSENTIAL   Not at goal today, start amlodipine 5 mg. Shown how to find PCP using MyChart.       Relevant Medications   amLODipine (NORVASC) 5 MG tablet     Genitourinary   Fibroid uterus     Other   Menorrhagia with regular cycle   Pelvic US  to assess situation, then we can discuss management options. Already on nexplanon .      Relevant Orders   US  PELVIC COMPLETE WITH TRANSVAGINAL   MORBID OBESITY   Relevant Orders   Amb Ref to Medical Weight Management   Nexplanon  in place   Well woman exam - Primary   Cancer screening: - Pap: up to date - Mammogram: n/a - Colonoscopy: n/a  Contraception: Nexplanon  placed 04/03/2021, happy with method but having very heavy periods. Has known large fibroid, will get US  to assess burden and see what next steps should be.  Mood: Flowsheet Row Routine Prenatal from 11/20/2020 in [facility] [employer]  PHQ-9 Total Score 4      Reports she is doing well  Vaccinations: - Flu: n/a - HPV: Discussed, wants to think about it  Metabolic: - DM: prediabetic at beginning of her pregnancy in 2022, since referring to healthy weight and wellness will defer testing - Lipids: not indicated -  Obesity: very interested in weight loss, will refer to [facility].   ID: - STI: declines, not sexually active at present       Routine preventative health maintenance measures emphasized. Please refer to After Visit Summary for other counseling recommendations.   Return in about 1 year (around 06/03/2024) for Annual Wellness Visit.    Total face-to-face time with patient: 30 minutes.  Over 50% of encounter was spent on counseling and coordination of care.   Teena Feast, MD/MPH Attending Family Medicine  Physician, [facility], [facility]

## 2023-06-04 NOTE — Assessment & Plan Note (Addendum)
 Pelvic US  to assess situation, then we can discuss management options. Already on nexplanon .

## 2023-06-12 ENCOUNTER — Ambulatory Visit (HOSPITAL_COMMUNITY)
Admission: RE | Admit: 2023-06-12 | Discharge: 2023-06-12 | Disposition: A | Discharge status: Home / Self Care | Source: Ambulatory Visit | Attending: Family Medicine | Admitting: Family Medicine

## 2023-06-12 DIAGNOSIS — D259 Leiomyoma of uterus, unspecified: Secondary | ICD-10-CM | POA: Diagnosis not present

## 2023-06-12 DIAGNOSIS — N920 Excessive and frequent menstruation with regular cycle: Secondary | ICD-10-CM | POA: Diagnosis not present

## 2023-06-12 DIAGNOSIS — N854 Malposition of uterus: Secondary | ICD-10-CM | POA: Diagnosis not present

## 2023-06-15 ENCOUNTER — Encounter: Payer: Self-pay | Admitting: Family Medicine

## 2023-06-16 ENCOUNTER — Other Ambulatory Visit (HOSPITAL_COMMUNITY): Payer: Self-pay

## 2023-07-13 ENCOUNTER — Encounter (INDEPENDENT_AMBULATORY_CARE_PROVIDER_SITE_OTHER): Payer: Self-pay

## 2023-07-30 ENCOUNTER — Encounter (HOSPITAL_COMMUNITY): Payer: Self-pay

## 2023-07-30 ENCOUNTER — Ambulatory Visit (HOSPITAL_COMMUNITY)
Admission: EM | Admit: 2023-07-30 | Discharge: 2023-07-30 | Disposition: A | Discharge status: Home / Self Care | Attending: Family Medicine | Admitting: Family Medicine

## 2023-07-30 DIAGNOSIS — J020 Streptococcal pharyngitis: Secondary | ICD-10-CM

## 2023-07-30 LAB — POCT RAPID STREP A (OFFICE): Rapid Strep A Screen: POSITIVE — AB

## 2023-07-30 MED ORDER — KETOROLAC TROMETHAMINE 60 MG/2ML IM SOLN
INTRAMUSCULAR | Status: AC
  Filled 2023-07-30: qty 2

## 2023-07-30 MED ORDER — AMOXICILLIN 875 MG PO TABS: 875.0000 mg | ORAL_TABLET | Freq: Two times a day (BID) | ORAL | 0 refills | Status: AC

## 2023-07-30 MED ORDER — KETOROLAC TROMETHAMINE 60 MG/2ML IM SOLN
60.0000 mg | Freq: Once | INTRAMUSCULAR | Status: AC
  Administered 2023-07-30: 60 mg via INTRAMUSCULAR

## 2023-07-30 NOTE — Discharge Instructions (Addendum)
 You may use over the counter ibuprofen  or acetaminophen  as needed.  For a sore throat, over the counter products such as [employer] Peroxyl Mouth Sore Rinse or Chloraseptic Sore Throat Spray may provide some temporary relief. Meds ordered this encounter  Medications   amoxicillin  (AMOXIL ) 875 MG tablet    Sig: Take 1 tablet (875 mg total) by mouth 2 (two) times daily for 10 days.    Dispense:  20 tablet    Refill:  0   ketorolac  (TORADOL ) injection 60 mg

## 2023-07-30 NOTE — ED Triage Notes (Signed)
 Chief Complaint: sore throat and headache. Denies any congestion or cough. Patient states the throat is sore and it hurts to swallow and talk.   Sick exposure: No  Onset: Today while at work, few hours ago   Prescriptions or OTC medications tried: Yes- tylenol     with little relief  New foods, medications, or products: No  Recent Travel: No

## 2023-07-30 NOTE — ED Provider Notes (Signed)
 [facility]   [id number] 07/30/23 Arrival Time: 1539  ASSESSMENT & PLAN:  1. Strep pharyngitis    No signs of peritonsillar abscess.  Meds ordered this encounter  Medications   amoxicillin  (AMOXIL ) 875 MG tablet    Sig: Take 1 tablet (875 mg total) by mouth 2 (two) times daily for 10 days.    Dispense:  20 tablet    Refill:  0   ketorolac  (TORADOL ) injection 60 mg    Results for orders placed or performed during the hospital encounter of 07/30/23  POC rapid strep A   Collection Time: 07/30/23  4:53 PM  Result Value Ref Range   Rapid Strep A Screen Positive (A) Negative   Labs Reviewed  POCT RAPID STREP A (OFFICE) - Abnormal; Notable for the following components:      Result Value   Rapid Strep A Screen Positive (*)    All other components within normal limits    OTC analgesics and throat care as needed  Instructed to finish full 10 day course of antibiotics. Will follow up if not showing significant improvement over the next 24-48 hours.    Discharge Instructions      You may use over the counter ibuprofen  or acetaminophen  as needed.  For a sore throat, over the counter products such as [employer] Peroxyl Mouth Sore Rinse or Chloraseptic Sore Throat Spray may provide some temporary relief. Meds ordered this encounter  Medications   amoxicillin  (AMOXIL ) 875 MG tablet    Sig: Take 1 tablet (875 mg total) by mouth 2 (two) times daily for 10 days.    Dispense:  20 tablet    Refill:  0   ketorolac  (TORADOL ) injection 60 mg        Reviewed expectations re: course of current medical issues. Questions answered. Outlined signs and symptoms indicating need for more acute intervention. Patient verbalized understanding. After Visit Summary given.   SUBJECTIVE:  Stacy Fowler is a 33 y.o. female who reports a sore throat. Abrupt onset; today. Tolerating PO intake. Tylenol  with mild help.   OBJECTIVE:  Vitals:   07/30/23 1632 07/30/23 1633  BP:  (!)  155/104  Pulse:  (!) 111  Resp:  18  Temp:  99.4 F (37.4 C)  TempSrc:  Oral  SpO2:  98%  Weight: (!) 158.8 kg   Height: 5' 7 (1.702 m)     BP and tachycardia noted. General appearance: alert; no distress HEENT: throat with moderate erythema and with exudative tonsillar hypertrophy; uvula is midline Neck: supple with FROM; no lymphadenopathy Lungs: speaks full sentences without difficulty; unlabored Abd: soft; non-tender Skin: reveals no rash; warm and dry Psychological: alert and cooperative; normal mood and affect  Allergies  Allergen Reactions   Other     Cantaloupe: itching    Past Medical History:  Diagnosis Date   Chronic hypertension    Gallstone pancreatitis    Genital herpes    Hyperemesis complicating pregnancy, antepartum    Infection    UTI   Obesity    Skin infection    Social History   Socioeconomic History   Marital status: Single    Spouse name: Not on file   Number of children: Not on file   Years of education: Not on file   Highest education level: Not on file  Occupational History   Not on file  Tobacco Use   Smoking status: Never   Smokeless tobacco: Never  Vaping Use   Vaping status: Never Used  Substance and Sexual Activity   Alcohol use: Not Currently    Comment: occasionally   Drug use: No   Sexual activity: Yes    Birth control/protection: None  Other Topics Concern   Not on file  Social History Narrative   Not on file   Social Drivers of Health   Financial Resource Strain: Not on file  Food Insecurity: No Food Insecurity (09/24/2020)   Hunger Vital Sign    Worried About Running Out of Food in the Last Year: Never true    Ran Out of Food in the Last Year: Never true  Transportation Needs: No Transportation Needs (09/24/2020)   PRAPARE - Administrator, Civil Service (Medical): No    Lack of Transportation (Non-Medical): No  Physical Activity: Not on file  Stress: Not on file  Social Connections: Not on file   Intimate Partner Violence: Not on file   Family History  Problem Relation Age of Onset   Healthy Mother    Diabetes Other    Hypertension Other            Rolinda Rogue, MD 07/30/23 1743

## 2023-08-17 ENCOUNTER — Ambulatory Visit: Admitting: Internal Medicine

## 2023-08-17 NOTE — Progress Notes (Deleted)
 Name: Stacy Fowler  MRN/ DOB: [id number], Aug 24, 1990    Age/ Sex: 33 y.o., female    PCP: Patient, No Pcp Per   Reason for Endocrinology Evaluation: Hyperthyroidism      Date of Initial Endocrinology Evaluation: 08/17/2020    HPI: Ms. Stacy Fowler is a 33 y.o. female with unremarkable  past medical history  . The patient presented for initial endocrinology clinic visit on 08/17/2020  for consultative assistance with her Hyperthyroidism.   She was diagnosed with hyperthyroidism during ED evaluation for nausea and vomiting and 05/2020 when she was noted to have a suppressed TSH < 0.01 uIU/mL . She was started on Methimazole  5 mg at the time    On her initial visit to our clinic in 2022 she was pregnant, and on methimazole , she was lost to follow-up   Denies FH of thyroid disease   Works at [employer]   SUBJECTIVE:    Today (08/17/23):  Stacy Fowler is here for follow-up on hyperthyroidism.     HISTORY:  Past Medical History:  Past Medical History:  Diagnosis Date   Chronic hypertension    Gallstone pancreatitis    Genital herpes    Hyperemesis complicating pregnancy, antepartum    Infection    UTI   Obesity    Skin infection    Past Surgical History:  Past Surgical History:  Procedure Laterality Date   BREAST REDUCTION SURGERY Bilateral 2009    Social History:  reports that she has never smoked. She has never used smokeless tobacco. She reports that she does not currently use alcohol. She reports that she does not use drugs. Family History: family history includes Diabetes in an other family member; Healthy in her mother; Hypertension in an other family member.   HOME MEDICATIONS: Allergies as of 08/17/2023       Reactions   Other    Cantaloupe: itching        Medication List        Accurate as of August 17, 2023  7:25 AM. If you have any questions, ask your nurse or doctor.          amLODipine  5 MG tablet Commonly known as: NORVASC  Take 1 tablet (5  mg total) by mouth daily.   valACYclovir  500 MG tablet Commonly known as: VALTREX  Take 1 tablet by mouth daily.          REVIEW OF SYSTEMS: A comprehensive ROS was conducted with the patient and is negative except as per HPI    OBJECTIVE:  VS: LMP 07/06/2023 (Approximate)    Wt Readings from Last 3 Encounters:  07/30/23 (!) 350 lb (158.8 kg)  06/04/23 (!) 354 lb (160.6 kg)  01/01/21 278 lb (126.1 kg)     EXAM: General: Pt appears well and is in NAD  Neck: General: Supple without adenopathy. Thyroid: Thyroid size normal.  No goiter or nodules appreciated.   Lungs: Clear with good BS bilat with no rales, rhonchi, or wheezes  Heart: Auscultation: RRR.  Abdomen: Normoactive bowel sounds, soft, nontender, without masses or organomegaly palpable  Extremities:  BL LE: No pretibial edema normal ROM and strength.  Skin: Hair: Texture and amount normal with gender appropriate distribution Skin Inspection: No rashes Skin Palpation: Skin temperature, texture, and thickness normal to palpation  Neuro: Cranial nerves: II - XII grossly intact  Motor: Normal strength throughout DTRs: 2+ and symmetric in UE without delay in relaxation phase  Mental Status: Judgment, insight: Intact Orientation: Oriented to time,  place, and person Mood and affect: No depression, anxiety, or agitation     DATA REVIEWED:   Results for Gola, Armya (MRN [id number]) as of 08/17/2020 17:44  Ref. Range 06/10/2020 17:30 06/27/2020 16:13  TSH Latest Ref Range: 0.450 - 4.500 uIU/mL <0.010 (L) 1.420  Triiodothyronine,Free,Serum Latest Ref Range: 2.0 - 4.4 pg/mL 8.8 (H)   T4,Free(Direct) Latest Ref Range: 0.61 - 1.12 ng/dL 5.67 (H)     ASSESSMENT/PLAN/RECOMMENDATIONS:   Hyperthyroid :  -She is clinically euthyroid -We discussed the differential diagnosis of Graves' disease, pregnancy related hyperthyroidism versus toxic thyroid nodule - The goal of treatment is to maintain persistent but mild  hyperthyroidism in the mother in an attempt to prevent fetal hypothyroidism since the fetal thyroid is more sensitive to the action of thionamide therapy. Overtreatment of maternal hyperthyroidism can cause fetal goiter and primary hypothyroidism.  -I am going to reduce her methimazole  as below  Medications : Decrease methimazole  to 5 mg twice daily   Labs in 3 weeks Follow-up in 2 months  Signed electronically by: Stefano Redgie Butts, MD  [facility] Endocrinology  [facility] [address] [place] [place], [place] [address] Phone: [phone] FAX: [phone]   CC: Patient, No Pcp Per No address on file Phone: None Fax: None   Return to Endocrinology clinic as below: Future Appointments  Date Time Provider Department Center  08/17/2023 11:50 AM Kainalu Heggs, Donell Redgie, MD [facility] None  08/18/2023  3:20 PM Midge Sober, DO [facility] None  09/02/2023  9:35 AM Cleatus Moccasin, MD [facility]

## 2023-08-18 ENCOUNTER — Institutional Professional Consult (permissible substitution) (INDEPENDENT_AMBULATORY_CARE_PROVIDER_SITE_OTHER): Payer: Self-pay | Admitting: Family Medicine

## 2023-09-01 NOTE — Progress Notes (Unsigned)
 GYNECOLOGY OFFICE VISIT NOTE  History:  Stacy Fowler is a 33 y.o. G1P1001 here today for surgery consultation for fibroid.   Discussed the use of AI scribe software for clinical note transcription with the patient, who gave verbal consent to proceed.  History of Present Illness Stacy Fowler is a 33 year old female who presents with heavy menstrual bleeding.  She has been experiencing heavy menstrual bleeding since resuming Nexplanon  after the birth of her daughter. The bleeding is consistent, occurring on the same days each cycle, and is often accompanied by blood clots approximately the size of a clementine. No cramping or abdominal pain is associated with her periods. Previously she had amenorrhea on Nexplanon .   She has a history of a large uterine fibroid, which was larger during her pregnancy but has since shrunk. The fibroid is located on the back of the uterus, measuring approximately 11 centimeters in width. It is subserosal.   Her past medical history includes a difficult first trimester during her pregnancy, characterized by severe nausea and vomiting, leading to hospitalization and significant weight loss. She had a vaginal delivery of a 7.5-pound baby.  She is currently using Nexplanon  for contraception and has no immediate plans for future childbearing, citing her current single status and previous difficult pregnancy experience. She is practicing abstinence.  She has a BMI of 54 and has been referred to a healthy weight and wellness program, although she missed her initial appointment and plans to reschedule.  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   Past Medical History:  Diagnosis Date   Chronic hypertension    Gallstone pancreatitis    Genital herpes    Hyperemesis complicating pregnancy, antepartum    Infection    UTI   Obesity    Skin infection     Past Surgical History:  Procedure Laterality Date   BREAST REDUCTION SURGERY Bilateral 2009    The following  portions of the patient's history were reviewed and updated as appropriate: allergies, current medications, past family history, past medical history, past social history, past surgical history and problem list.   Health Maintenance:   HPV negative.  Diagnosis  Date Value Ref Range Status  04/03/2021 (A)  Final   - Atypical squamous cells of undetermined significance (ASC-US )    Review of Systems:  Pertinent items noted in HPI and remainder of comprehensive ROS otherwise negative.  Physical Exam:  BP (!) 173/117   Pulse (!) 112   Ht 5' 7 (1.702 m)   Wt (!) 346 lb (156.9 kg)   LMP 08/29/2023 (Exact Date)   BMI 54.19 kg/m  CONSTITUTIONAL: Well-developed, well-nourished female in no acute distress.  HEENT:  Normocephalic, atraumatic. External right and left ear normal. No scleral icterus.  NECK: Normal range of motion, supple, no masses noted on observation SKIN: No rash noted. Not diaphoretic. No erythema. No pallor. MUSCULOSKELETAL: Normal range of motion. No edema noted. NEUROLOGIC: Alert and oriented to person, place, and time. Normal muscle tone coordination. No cranial nerve deficit noted. PSYCHIATRIC: Normal mood and affect. Normal behavior. Normal judgment and thought content.  PELVIC: Deferred  Labs and Imaging No results found for this or any previous visit (from the past week). No results found.  Assessment and Plan:  1. Uterine leiomyoma, unspecified location (Primary) Heavy menstrual bleeding in the setting of large posterior subserosal uterine fibroid She has a large posterior subserosal uterine fibroid not causing her heavy menstrual bleeding. Nexplanon  may not be effectively controlling her bleeding. She is  not interested in having more children for now but would prefer the option to be left open. . - Discussed non-surgical options as preferred. (She agrees) - Provided information on tranexamic acid  (Lysteda ) for nonhormonal management, potential 40% reduction in  bleeding. - Discussed using an IUD with Nexplanon  to manage bleeding. - Provided information on GnRH antagonists (Oriahnn or Myfembree) for fibroid shrinkage and bleeding control. - Discussed surgical options including uterine artery embolization and myomectomy, but she prefers to delay surgery. - Order MRI for preoperative planning if surgery is considered. - Provided after-visit summary with information on all discussed treatment options. - Her BMI is 54, which may impact surgical options and outcomes. Weight loss could facilitate easier surgical procedures. - Encouraged rescheduling and participation in the healthy weight and wellness program. - tranexamic acid  (LYSTEDA ) 650 MG TABS tablet; Take 2 tablets (1,300 mg total) by mouth 3 (three) times daily. Take during menses for a maximum of five days  Dispense: 30 tablet; Refill: 6  2. Positive depression screening - Patient also showed how to schedule new PCP.  - Ambulatory referral to Integrated Behavioral [facility]    Meds ordered this encounter  Medications   tranexamic acid  (LYSTEDA ) 650 MG TABS tablet    Sig: Take 2 tablets (1,300 mg total) by mouth 3 (three) times daily. Take during menses for a maximum of five days    Dispense:  30 tablet    Refill:  6     Routine preventative health maintenance measures emphasized. Please refer to After Visit Summary for other counseling recommendations.   Return in about 7 months (around 04/01/2024) for annual, Follow up.  Vina Solian, MD, FACOG Obstetrician & Gynecologist, [facility] for [facility], [facility]

## 2023-09-02 ENCOUNTER — Encounter: Payer: Self-pay | Admitting: Obstetrics and Gynecology

## 2023-09-02 ENCOUNTER — Ambulatory Visit: Admitting: Obstetrics and Gynecology

## 2023-09-02 ENCOUNTER — Other Ambulatory Visit: Payer: Self-pay

## 2023-09-02 ENCOUNTER — Telehealth: Payer: Self-pay | Admitting: Clinical

## 2023-09-02 ENCOUNTER — Other Ambulatory Visit (HOSPITAL_COMMUNITY): Payer: Self-pay

## 2023-09-02 VITALS — BP 173/117 | HR 112 | Ht 67.0 in | Wt 346.0 lb

## 2023-09-02 DIAGNOSIS — D259 Leiomyoma of uterus, unspecified: Secondary | ICD-10-CM | POA: Diagnosis not present

## 2023-09-02 DIAGNOSIS — Z1331 Encounter for screening for depression: Secondary | ICD-10-CM | POA: Diagnosis not present

## 2023-09-02 MED ORDER — TRANEXAMIC ACID 650 MG PO TABS
1300.0000 mg | ORAL_TABLET | Freq: Three times a day (TID) | ORAL | 6 refills | Status: AC
  Filled 2023-09-02: qty 30, 5d supply, fill #0
  Filled 2023-10-29: qty 30, 5d supply, fill #1

## 2023-09-02 NOTE — Telephone Encounter (Signed)
Attempt call regarding referral; Left HIPPA-compliant message to call back Mikle Sternberg from [facility] at  [phone] (Ashelyn Mccravy's office).    

## 2023-09-11 ENCOUNTER — Telehealth: Payer: Self-pay | Admitting: Clinical

## 2023-09-11 NOTE — Telephone Encounter (Signed)
Attempt call regarding referral; Left HIPPA-compliant message to call back Mikle Sternberg from [facility] at  [phone] (Ashelyn Mccravy's office).    

## 2023-11-09 ENCOUNTER — Other Ambulatory Visit (HOSPITAL_COMMUNITY): Payer: Self-pay

## 2023-11-13 ENCOUNTER — Other Ambulatory Visit (HOSPITAL_COMMUNITY): Payer: Self-pay

## 2023-11-13 MED ORDER — FLUZONE 0.5 ML IM SUSY
0.5000 mL | PREFILLED_SYRINGE | Freq: Once | INTRAMUSCULAR | 0 refills | Status: AC
  Filled 2023-11-13: qty 0.5, 1d supply, fill #0

## 2024-06-08 ENCOUNTER — Ambulatory Visit: Admitting: Family Medicine
